# Patient Record
Sex: Female | Born: 1965 | Race: Black or African American | Hispanic: No | Marital: Single | Smoking: Never smoker
Health system: Southern US, Community
[De-identification: ages and names within clinical notes are randomized; demographics above are authoritative.]

## PROBLEM LIST (undated history)

## (undated) DIAGNOSIS — J45909 Unspecified asthma, uncomplicated: Secondary | ICD-10-CM

## (undated) DIAGNOSIS — K219 Gastro-esophageal reflux disease without esophagitis: Secondary | ICD-10-CM

## (undated) DIAGNOSIS — M549 Dorsalgia, unspecified: Secondary | ICD-10-CM

## (undated) DIAGNOSIS — R51 Headache: Secondary | ICD-10-CM

## (undated) DIAGNOSIS — I10 Essential (primary) hypertension: Secondary | ICD-10-CM

## (undated) DIAGNOSIS — Z9889 Other specified postprocedural states: Secondary | ICD-10-CM

## (undated) DIAGNOSIS — R112 Nausea with vomiting, unspecified: Secondary | ICD-10-CM

## (undated) DIAGNOSIS — G43909 Migraine, unspecified, not intractable, without status migrainosus: Secondary | ICD-10-CM

## (undated) HISTORY — DX: Dorsalgia, unspecified: M54.9

## (undated) HISTORY — PX: ABDOMINAL HYSTERECTOMY: SHX81

## (undated) HISTORY — DX: Essential (primary) hypertension: I10

## (undated) HISTORY — PX: ECTOPIC PREGNANCY SURGERY: SHX613

## (undated) HISTORY — DX: Migraine, unspecified, not intractable, without status migrainosus: G43.909

## (undated) HISTORY — DX: Gastro-esophageal reflux disease without esophagitis: K21.9

## (undated) HISTORY — PX: ENDOMETRIAL ABLATION: SHX621

## (undated) HISTORY — PX: OTHER SURGICAL HISTORY: SHX169

## (undated) HISTORY — PX: POLYPECTOMY: SHX149

## (undated) HISTORY — PX: SALPINGECTOMY: SHX328

---

## 2001-05-21 ENCOUNTER — Ambulatory Visit (HOSPITAL_COMMUNITY): Admission: RE | Admit: 2001-05-21 | Discharge: 2001-05-21 | Payer: Self-pay | Admitting: General Surgery

## 2001-05-28 ENCOUNTER — Ambulatory Visit (HOSPITAL_COMMUNITY): Admission: RE | Admit: 2001-05-28 | Discharge: 2001-05-28 | Payer: Self-pay | Admitting: General Surgery

## 2001-05-29 ENCOUNTER — Encounter: Payer: Self-pay | Admitting: General Surgery

## 2002-08-27 ENCOUNTER — Ambulatory Visit (HOSPITAL_COMMUNITY): Admission: RE | Admit: 2002-08-27 | Discharge: 2002-08-27 | Payer: Self-pay | Admitting: Otolaryngology

## 2002-08-27 ENCOUNTER — Encounter: Payer: Self-pay | Admitting: Otolaryngology

## 2002-10-27 ENCOUNTER — Encounter (HOSPITAL_COMMUNITY): Admission: RE | Admit: 2002-10-27 | Discharge: 2002-11-26 | Payer: Self-pay | Admitting: Otolaryngology

## 2003-02-07 ENCOUNTER — Ambulatory Visit (HOSPITAL_COMMUNITY): Admission: RE | Admit: 2003-02-07 | Discharge: 2003-02-07 | Payer: Self-pay | Admitting: Otolaryngology

## 2003-02-07 ENCOUNTER — Encounter (INDEPENDENT_AMBULATORY_CARE_PROVIDER_SITE_OTHER): Payer: Self-pay | Admitting: *Deleted

## 2003-02-07 ENCOUNTER — Ambulatory Visit (HOSPITAL_BASED_OUTPATIENT_CLINIC_OR_DEPARTMENT_OTHER): Admission: RE | Admit: 2003-02-07 | Discharge: 2003-02-07 | Payer: Self-pay | Admitting: Otolaryngology

## 2005-03-21 ENCOUNTER — Ambulatory Visit: Payer: Self-pay | Admitting: Family Medicine

## 2005-04-19 ENCOUNTER — Ambulatory Visit (HOSPITAL_COMMUNITY): Admission: RE | Admit: 2005-04-19 | Discharge: 2005-04-19 | Payer: Self-pay | Admitting: Family Medicine

## 2005-07-03 ENCOUNTER — Ambulatory Visit (HOSPITAL_COMMUNITY): Admission: RE | Admit: 2005-07-03 | Discharge: 2005-07-03 | Payer: Self-pay | Admitting: Obstetrics & Gynecology

## 2005-07-03 ENCOUNTER — Encounter (INDEPENDENT_AMBULATORY_CARE_PROVIDER_SITE_OTHER): Payer: Self-pay | Admitting: *Deleted

## 2006-03-25 ENCOUNTER — Ambulatory Visit: Payer: Self-pay | Admitting: Family Medicine

## 2006-04-10 ENCOUNTER — Ambulatory Visit: Payer: Self-pay | Admitting: Family Medicine

## 2006-04-11 ENCOUNTER — Ambulatory Visit (HOSPITAL_COMMUNITY): Admission: RE | Admit: 2006-04-11 | Discharge: 2006-04-11 | Payer: Self-pay | Admitting: Family Medicine

## 2006-12-09 ENCOUNTER — Ambulatory Visit: Payer: Self-pay | Admitting: Family Medicine

## 2007-05-26 ENCOUNTER — Ambulatory Visit: Payer: Self-pay | Admitting: Family Medicine

## 2007-06-01 DIAGNOSIS — M542 Cervicalgia: Secondary | ICD-10-CM | POA: Insufficient documentation

## 2007-06-01 DIAGNOSIS — M549 Dorsalgia, unspecified: Secondary | ICD-10-CM | POA: Insufficient documentation

## 2007-06-03 ENCOUNTER — Ambulatory Visit: Payer: Self-pay | Admitting: Family Medicine

## 2007-06-03 ENCOUNTER — Ambulatory Visit (HOSPITAL_COMMUNITY): Admission: RE | Admit: 2007-06-03 | Discharge: 2007-06-03 | Payer: Self-pay | Admitting: Family Medicine

## 2007-06-10 ENCOUNTER — Encounter (HOSPITAL_COMMUNITY): Admission: RE | Admit: 2007-06-10 | Discharge: 2007-07-10 | Payer: Self-pay | Admitting: Family Medicine

## 2007-06-22 ENCOUNTER — Encounter: Payer: Self-pay | Admitting: Family Medicine

## 2007-06-22 ENCOUNTER — Ambulatory Visit (HOSPITAL_COMMUNITY): Admission: RE | Admit: 2007-06-22 | Discharge: 2007-06-22 | Payer: Self-pay | Admitting: Orthopaedic Surgery

## 2007-06-22 LAB — CONVERTED CEMR LAB
BUN: 7 mg/dL (ref 6–23)
Calcium: 9.3 mg/dL (ref 8.4–10.5)
Chloride: 105 meq/L (ref 96–112)
Cholesterol: 163 mg/dL (ref 0–200)
Eosinophils Absolute: 0.1 10*3/uL (ref 0.0–0.7)
HCT: 42.8 % (ref 36.0–46.0)
LDL Cholesterol: 93 mg/dL (ref 0–99)
Lymphocytes Relative: 27 % (ref 12–46)
Monocytes Absolute: 0.5 10*3/uL (ref 0.1–1.0)
Platelets: 298 10*3/uL (ref 150–400)
RBC: 4.87 M/uL (ref 3.87–5.11)
Sodium: 139 meq/L (ref 135–145)
Total CHOL/HDL Ratio: 2.7
Triglycerides: 52 mg/dL (ref <150)
VLDL: 10 mg/dL (ref 0–40)

## 2007-07-14 ENCOUNTER — Encounter (HOSPITAL_COMMUNITY): Admission: RE | Admit: 2007-07-14 | Discharge: 2007-08-13 | Payer: Self-pay | Admitting: Family Medicine

## 2007-07-28 ENCOUNTER — Ambulatory Visit: Payer: Self-pay | Admitting: Family Medicine

## 2007-10-10 ENCOUNTER — Emergency Department (HOSPITAL_COMMUNITY): Admission: EM | Admit: 2007-10-10 | Discharge: 2007-10-10 | Payer: Self-pay | Admitting: Emergency Medicine

## 2007-10-12 ENCOUNTER — Telehealth: Payer: Self-pay | Admitting: Family Medicine

## 2007-10-13 ENCOUNTER — Encounter: Payer: Self-pay | Admitting: Family Medicine

## 2007-11-30 ENCOUNTER — Ambulatory Visit: Payer: Self-pay | Admitting: Family Medicine

## 2008-08-18 ENCOUNTER — Emergency Department (HOSPITAL_COMMUNITY): Admission: EM | Admit: 2008-08-18 | Discharge: 2008-08-18 | Payer: Self-pay | Admitting: Emergency Medicine

## 2009-05-26 ENCOUNTER — Encounter (HOSPITAL_COMMUNITY): Admission: RE | Admit: 2009-05-26 | Discharge: 2009-06-25 | Payer: Self-pay | Admitting: Family Medicine

## 2009-08-11 ENCOUNTER — Encounter: Payer: Self-pay | Admitting: Family Medicine

## 2009-08-24 ENCOUNTER — Ambulatory Visit: Payer: Self-pay | Admitting: Family Medicine

## 2009-08-24 DIAGNOSIS — H6690 Otitis media, unspecified, unspecified ear: Secondary | ICD-10-CM | POA: Insufficient documentation

## 2009-08-24 DIAGNOSIS — R519 Headache, unspecified: Secondary | ICD-10-CM | POA: Insufficient documentation

## 2009-08-24 DIAGNOSIS — R51 Headache: Secondary | ICD-10-CM | POA: Insufficient documentation

## 2010-03-04 ENCOUNTER — Encounter: Payer: Self-pay | Admitting: Family Medicine

## 2010-03-13 NOTE — Assessment & Plan Note (Signed)
Summary: migraine   Vital Signs:  Patient profile:   45 year old female Height:      64 inches Weight:      207.25 pounds BMI:     35.70 O2 Sat:      100 % on Room air Pulse rate:   72 / minute Pulse rhythm:   regular Resp:     16 per minute BP sitting:   112 / 78  (left arm)  Vitals Entered By: Adella Hare LPN (August 24, 2009 2:58 PM)  Nutrition Counseling: Patient's BMI is greater than 25 and therefore counseled on weight management options.  O2 Flow:  Room air CC: migraine, with pain mostly on left side of head and neck Is Patient Diabetic? No   CC:  migraine and with pain mostly on left side of head and neck.  History of Present Illness: Pt states she has been unable to work since 07/12 because of headacher, Glynis Smiles has been using OTC migraine med with little relief, expeiriences nausea and dizziness with activity.Currently exp pain to a level 4 , has got up to an 8.Has been working extra long hrs and drinking xs caffeine She denies any recent fever or chills. She denies head or chest congestion. She denies dysuria or frequency. She denies anxiety or depression. She denioes significant joint pain or instability. She continues to work on lifestyle change to improve health and lose weight, but with less consistency than she desires.She denies chest pain, palpitations, pND or orthopnea  Current Medications (verified): 1)  None  Allergies (verified): 1)  ! Pcn  Past History:  Past Medical History: Current Problems:  NECK PAIN (ICD-723.1) BACK PAIN (ICD-724.5) secondary to disc disease, in therapy cuirrently 08/2009 OBESITY, UNSPECIFIED (ICD-278.00) Laceration to left index finger 2009, still numb, ortho taking care of this Migraines  since childhood  Past Surgical History: Right salpingectomy for ectopic pregnancy (1997) Laser surgery to vocal cords (2004) Endometrial ablation for DUB (2007)  dr Despina Hidden  Family History: Mother deceased - heart attack, HTN, died  17 Father living - health status unknown Two sisters living - x1 HTN Three brothers living - x1 HTN  Social History: Employed: Location manager Single  One child, 32 y/o son in 2011 Never Smoked Alcohol use-no Drug use-no  Review of Systems      See HPI General:  Complains of fatigue. Eyes:  Complains of eye pain; denies blurring, discharge, and red eye. ENT:  Complains of earache; left ear pain x 4 days. Resp:  Denies cough, shortness of breath, and sputum productive. GI:  Complains of nausea; denies abdominal pain, constipation, diarrhea, and vomiting; nausea with headache at times. Derm:  Denies itching and lesion(s). Neuro:  Complains of headaches; experiencess 3 headaches over a 4 month period, generally like her classic symptoms, for the past 4 days she has experienced throbbing , nausea, vertigo with this headache, smells are the most disturbing.ASffecting behind left eye to left ear, and neck , the forehead had alaso been involved in the past. Endo:  Denies excessive thirst and heat intolerance. Heme:  Denies abnormal bruising and bleeding. Allergy:  Denies hives or rash and itching eyes.  Physical Exam  General:  Well-developed,well-nourished,in no acute distress; alert,appropriate and cooperative throughout examinationPt in pain, holding hed. HEENT: No facial asymmetry,  EOMI, No sinus tenderness,right  TM' Clear, left is dull with reduced light reflex and fluid behind the drum,oropharynx  pink and moist.   Chest: Clear to auscultation bilaterally.  CVS: S1,  S2, No murmurs, No S3.   Abd: Soft, Nontender.  MS: Adequate ROM spine, hips, shoulders and knees.  Ext: No edema.   CNS: CN 2-12 intact, power tone and sensation normal throughout.   Skin: Intact, no visible lesions or rashes.  Psych: Good eye contact, normal affect.  Memory intact, not anxious or depressed appearing.    Impression & Recommendations:  Problem # 1:  LOM (ICD-382.9) Assessment Comment  Only  Her updated medication list for this problem includes:    Septra Ds 800-160 Mg Tabs (Sulfamethoxazole-trimethoprim) .Marland Kitchen... Take 1 tablet by mouth two times a day  Problem # 2:  HEADACHE (ICD-784.0) Assessment: Deteriorated  Her updated medication list for this problem includes:    Imitrex 100 Mg Tabs (Sumatriptan succinate) ..... One capsule at headache onset then repeat once after 2 hrs if needed, max 2 in 24 hrs  Orders: Depo- Medrol 80mg  (J1040) Ketorolac-Toradol 15mg  (W1191) Admin of Therapeutic Inj  intramuscular or subcutaneous (47829)  Problem # 3:  OBESITY, UNSPECIFIED (ICD-278.00) Assessment: Unchanged  Ht: 64 (08/24/2009)   Wt: 207.25 (08/24/2009)   BMI: 35.70 (08/24/2009)  Complete Medication List: 1)  Topiramate 25 Mg Tabs (Topiramate) .... Take 1 tablet by mouth two times a day 2)  Imitrex 100 Mg Tabs (Sumatriptan succinate) .... One capsule at headache onset then repeat once after 2 hrs if needed, max 2 in 24 hrs 3)  Prednisone (pak) 5 Mg Tabs (Prednisone) .... Use as directed 4)  Septra Ds 800-160 Mg Tabs (Sulfamethoxazole-trimethoprim) .... Take 1 tablet by mouth two times a day  Other Orders: T-Basic Metabolic Panel [id number]) T-Lipid Profile [id number]) T-CBC w/Diff [phone]) T-TSH [id number]) Radiology Referral (Radiology)  Patient Instructions: 1)  Please schedule a follow-up appointment in 3 months. 2)  It is important that you exercise regularly at least 30 minutes 5 times a week. If you develop chest pain, have severe difficulty breathing, or feel very tired , stop exercising immediately and seek medical attention. 3)  You need to lose weight. Consider a lower calorie diet and regular exercise.  4)  BMP prior to visit, ICD-9: 5)  Lipid Panel prior to visit, ICD-9:   fasting in 3 months 6)  CBC w/ Diff prior to visit, ICD-9: 7)  TSH prior to visit, ICD-9: 8)  You need a mamogram, we will schedule. 9)  You will receive injections for  your headache .Meds are also sent in for this and the ear infection 10)  Pls start a headache diary Prescriptions: SEPTRA DS 800-160 MG TABS (SULFAMETHOXAZOLE-TRIMETHOPRIM) Take 1 tablet by mouth two times a day  #14 x 0   Entered and Authorized by:   Syliva Overman MD   Signed by:   Syliva Overman MD on 08/24/2009   Method used:   Electronically to        [facility] (retail)       [address]       [place], [place]  [address]       Ph: [phone]       Fax: [phone]   RxID:   [id number] PREDNISONE (PAK) 5 MG TABS (PREDNISONE) Use as directed  #21 x 0   Entered and Authorized by:   Syliva Overman MD   Signed by:   Syliva Overman MD on 08/24/2009   Method used:   Electronically to        [facility] (retail)       [address]  [place], [place]  [address]       Ph: [phone]       Fax: [phone]   RxID:   [id number] IMITREX 100 MG TABS (SUMATRIPTAN SUCCINATE) one capsule at headache onset then repeat once after 2 hrs if needed, max 2 in 24 hrs  #8 x 3   Entered and Authorized by:   Syliva Overman MD   Signed by:   Syliva Overman MD on 08/24/2009   Method used:   Electronically to        [facility] ([facility]       [address]       [place], [place]  [address]       Ph: [phone]       Fax: [phone]   RxID:   [id number] TOPIRAMATE 25 MG TABS (TOPIRAMATE) Take 1 tablet by mouth two times a day  #60 x 3   Entered and Authorized by:   Syliva Overman MD   Signed by:   Syliva Overman MD on 08/24/2009   Method used:   Electronically to        [facility] ([facility])       [address]       [place], [place]  [address]       Ph: [phone]       Fax: [phone]   RxID:   [id number]    Medication Administration  Injection # 1:    Medication: Depo- Medrol 80mg     Diagnosis: HEADACHE (ICD-784.0)    Route: IM    Site: RUOQ gluteus    Exp Date: 4/12    Lot #: [id number]     Mfr: Pharmacia    Patient tolerated injection without complications    Given by: Adella Hare LPN (August 24, 2009 3:55 PM)  Injection # 2:    Medication: Ketorolac-Toradol 15mg     Diagnosis: HEADACHE (ICD-784.0)    Route: IM    Site: RUOQ gluteus    Exp Date: 01/12/2011    Lot #: [id number]    Mfr: hospira    Comments: toradol 60mg  given    Patient tolerated injection without complications    Given by: Adella Hare LPN (August 24, 2009 3:55 PM)  Orders Added: 1)  Est. Patient Level IV [73710] 2)  T-Basic Metabolic Panel [[id number]] 3)  T-Lipid Profile [80061-22930] 4)  T-CBC w/Diff [[id number]] 5)  T-TSH [[id number]] 6)  Depo- Medrol 80mg  [J1040] 7)  Ketorolac-Toradol 15mg  [J1885] 8)  Admin of Therapeutic Inj  intramuscular or subcutaneous [96372] 9)  Radiology Referral [Radiology]

## 2010-03-13 NOTE — Letter (Signed)
Summary: Out of Work  [facility]  [address]   [place], [place] [address]   Phone: [phone]  Fax: [phone]    August 24, 2009   Employee:  Bridget Roberts Gosnell    To Whom It May Concern:   For Medical reasons, please excuse the above named employee from work for the following dates:  Start:   08/22/09  End:   08/28/09 to return with no restriction  If you need additional information, please feel free to contact our office.         Sincerely,    Syliva Overman, MD

## 2010-03-15 NOTE — Miscellaneous (Signed)
Summary: flu  flu   Imported By: Lind Guest 02/21/2010 11:24:38  _____________________________________________________________________  External Attachment:    Type:   Image     Comment:   External Document

## 2010-06-29 NOTE — Op Note (Signed)
NAME:  Bridget Roberts, Bridget Roberts                         ACCOUNT NO.:  [id number]   MEDICAL RECORD NO.:  [id number]                   PATIENT TYPE:  AMB   LOCATION:  DSC                                  FACILITY:  [facility]   PHYSICIAN:  Karol T. Lazarus Salines, M.D.              DATE OF BIRTH:  1965/03/10   DATE OF PROCEDURE:  02/07/2003  DATE OF DISCHARGE:                                 OPERATIVE REPORT   PREOPERATIVE DIAGNOSIS:  Left vocal cord cystic nodule.   POSTOPERATIVE DIAGNOSIS:  Left vocal cord cystic nodule.   PROCEDURE PERFORMED:  Micro-direct laryngoscopy with stripping, left  anterior vocal cord.   SURGEON:  Gloris Manchester. Lazarus Salines, M.D.   ANESTHESIA:  General orotracheal.   BLOOD LOSS:  Minimal.   COMPLICATIONS:  None.   FINDINGS:  A flat, slightly irregular mucosal lesion on the anterior left  vocal cord without any obvious cystic component, and readily dissected free  from the Reinke's layer.  A small flat thickening on the opposite cord felt  to be reactive.   PROCEDURE:  With the patient in a comfortable supine position, general  orotracheal anesthesia was induced without difficulty.  At an appropriate  level, the table was turned 90 degrees and the patient placed in reversed  Trendelenburg.  A clean preparation and draping were accomplished.  Rubber  tooth guard was placed.  Taking care to protect lips, teeth, and  endotracheal tube, the Dedo laryngoscope was introduced, inserted under the  epiglottis and into the glottis proper, with the findings as described  above.  The scope was suspended in the standard fashion.  Slight anterior  external pressure was required to fully visualize the anterior commissure.  The findings were as described above.  Photographs were taken.  Cocaine  solution 4% was applied on cotton pledgets to the mucosa of the vocal cords  for intraoperative hemostasis and allowed to remain in place for several  minutes.   The pledgets were removed and observed  to be intact and correct in number.  Using the sharp sickle knife, a linear incision along the axis of the vocal  cord was made just superior to the lesion and using the sickle knife, the  mucosa was dissected from the Reinke's layer of the vocal cord.  Inferior  and posterior delimiting incisions were performed with straight scissors and  the inferior mucosal margin beneath the lesion was incised with the same  scissors.  The lesion was sent off for permanent interpretation.  There was  no bleeding.  The lesion was felt to have been removed in its entirety.  Cocaine pledgets 4% were applied once again for hemostasis and to prevent  coughing postoperatively.  Again, they were allowed to remain in place for  several minutes.  Photographs were taken.  At this point, the procedure was  completed.  Frozen section was not obtained because the lesion was too  small.  The laryngoscope was unsuspended and removed, as was the tooth  guard.  The dental status was intact.  The patient was returned to  anesthesia, awakened, extubated, and transferred to recovery in stable  condition.   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  The flat lesion may  have been a ruptured cyst.  Anticipate a routine postoperative recovery with  attention to analgesia with Tylenol with codeine, which will also serve as a  cough suppressant, and with vocal rest and vocal hygiene.                                               Gloris Manchester. Lazarus Salines, M.D.    KTW/MEDQ  D:  02/07/2003  T:  02/07/2003  Job:  [id number]   cc:   Elliot Cousin, M.D.

## 2010-06-29 NOTE — Op Note (Signed)
NAMECLEDITH, KAMIYA               ACCOUNT NO.:  [id number]   MEDICAL RECORD NO.:  [id number]          PATIENT TYPE:  AMB   LOCATION:  DAY                           FACILITY:  [facility]   PHYSICIAN:  Lazaro Arms, M.D.   DATE OF BIRTH:  19-Jun-1965   DATE OF PROCEDURE:  07/03/2005  DATE OF DISCHARGE:                                 OPERATIVE REPORT   PREOPERATIVE DIAGNOSES:  1.  Menometrorrhagia.  2.  Dysmenorrhea.   POSTOPERATIVE DIAGNOSES:  1.  Menometrorrhagia.  2.  Dysmenorrhea.   PROCEDURE:  Hysteroscopy, dilatation and curettage, endometrial ablation.   SURGEON:  Lazaro Arms, M.D.   ANESTHESIA:  General endotracheal.   FINDINGS:  Patient had a normal endometrial cavity.  There were no polyps or  other abnormalities.   DESCRIPTION OF OPERATION:  The patient was taken to the operating room,  placed in a supine position, where she underwent general endotracheal  anesthesia.  She was placed in a dorsal lithotomy position, prepped and  draped in the usual sterile fashion.  A Graves speculum was placed.  The  cervix was grasped.  Diagnostic hysteroscopies were performed, and normal  endometrial cavity was seen.  A vigorous uterine curettage was performed,  and good uterine cry was obtained in all areas.  A ThermaChoice III  endometrial ablation balloon was then used.  It was filled to maintain a  pressure of 190-200 mmHg and heated to 88 degrees Celsius.  Total therapy  time was 9 minutes, 32 seconds.  All of the fluid was returned at the end of  the procedure.  __________ cc to maintain the pressure.   Patient was awakened from anesthesia and taken to the recovery room in good,  stable condition.  All counts were correct x3.      Lazaro Arms, M.D.  Electronically Signed     LHE/MEDQ  D:  07/03/2005  T:  07/03/2005  Job:  [id number]

## 2010-06-29 NOTE — H&P (Signed)
NAME:  Bridget Roberts, Bridget Roberts               ACCOUNT NO.:  [id number]   MEDICAL RECORD NO.:  [id number]          PATIENT TYPE:  AMB   LOCATION:  DAY                           FACILITY:  [facility]   PHYSICIAN:  Lazaro Arms, M.D.   DATE OF BIRTH:  05-01-1965   DATE OF ADMISSION:  DATE OF DISCHARGE:  LH                                HISTORY & PHYSICAL   Bridget Roberts is a 45 year old African-American female, gravida 5, para 1, abortus  4, status post two tubal pregnancies in the past with subsequent removal,  who is complaining of increasing heavy pain at the time of her cycle,  bleeding 7 to 10 days with heavy flow and large blood clots.  She had an  ultrasound performed which revealed a normal uterus, normal endometrium.  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  She is status post two  ectopic pregnancies with subsequent removal.  As a result, she is admitted  for hysteroscopy, D&C, and endometrial ablation.   PAST MEDICAL HISTORY:  Significant for migraine headaches.   PAST SURGICAL HISTORY:  She has had removal of two tubal pregnancies.   ALLERGIES:  PENICILLIN.   REVIEW OF SYSTEMS:  Otherwise negative.   PHYSICAL EXAMINATION:  HEENT:  Unremarkable.  Thyroid is normal.  LUNGS:  Clear are clear.  HEART:  Regular rate and rhythm with no regurgitation or gallop.  BREASTS:  Without masses or discharge or skin changes.  ABDOMEN:  Benign.  No hepatosplenomegaly or mass.  PELVIC:  She has normal external genitalia. Vagina is pink with slight  discharge.  Cervix with parous with what looks like lesions.  Uterus normal  size, shape and contour.  Ovaries are normal and nontender.  EXTREMITIES:  No edema.  NEUROLOGICAL:  Grossly intact.   IMPRESSION:  1.  Menometrorrhagia.  2.  Dysmenorrhea.   PLAN:  The patient is admitted for hysteroscopy, D&C and endometrial  ablation for management of her problems.  She understands the risks,  benefits, indications and alternatives to the ablation and  will proceed.      Lazaro Arms, M.D.  Electronically Signed     LHE/MEDQ  D:  07/02/2005  T:  07/03/2005  Job:  [id number]

## 2011-04-09 ENCOUNTER — Other Ambulatory Visit: Payer: Self-pay | Admitting: Family Medicine

## 2011-04-09 ENCOUNTER — Encounter: Payer: Self-pay | Admitting: Family Medicine

## 2011-04-09 ENCOUNTER — Ambulatory Visit (INDEPENDENT_AMBULATORY_CARE_PROVIDER_SITE_OTHER): Payer: Managed Care, Other (non HMO) | Admitting: Family Medicine

## 2011-04-09 VITALS — BP 126/80 | HR 82 | Resp 18 | Ht 64.0 in | Wt 205.0 lb

## 2011-04-09 DIAGNOSIS — N898 Other specified noninflammatory disorders of vagina: Secondary | ICD-10-CM

## 2011-04-09 DIAGNOSIS — E669 Obesity, unspecified: Secondary | ICD-10-CM

## 2011-04-09 DIAGNOSIS — R7301 Impaired fasting glucose: Secondary | ICD-10-CM

## 2011-04-09 DIAGNOSIS — E559 Vitamin D deficiency, unspecified: Secondary | ICD-10-CM

## 2011-04-09 DIAGNOSIS — Z139 Encounter for screening, unspecified: Secondary | ICD-10-CM

## 2011-04-09 DIAGNOSIS — M549 Dorsalgia, unspecified: Secondary | ICD-10-CM

## 2011-04-09 DIAGNOSIS — R5381 Other malaise: Secondary | ICD-10-CM

## 2011-04-09 DIAGNOSIS — Z13220 Encounter for screening for lipoid disorders: Secondary | ICD-10-CM

## 2011-04-09 DIAGNOSIS — N939 Abnormal uterine and vaginal bleeding, unspecified: Secondary | ICD-10-CM

## 2011-04-09 MED ORDER — METHYLPREDNISOLONE ACETATE PF 80 MG/ML IJ SUSP
80.0000 mg | Freq: Once | INTRAMUSCULAR | Status: AC
  Administered 2011-04-09: 80 mg via INTRAMUSCULAR

## 2011-04-09 MED ORDER — PREDNISONE (PAK) 5 MG PO TABS: 5.0000 mg | ORAL_TABLET | ORAL | Status: DC

## 2011-04-09 MED ORDER — IBUPROFEN 800 MG PO TABS: 800.0000 mg | ORAL_TABLET | Freq: Three times a day (TID) | ORAL | Status: DC | PRN

## 2011-04-09 MED ORDER — KETOROLAC TROMETHAMINE 60 MG/2ML IM SOLN
60.0000 mg | Freq: Once | INTRAMUSCULAR | Status: AC
  Administered 2011-04-09: 60 mg via INTRAMUSCULAR

## 2011-04-09 NOTE — Patient Instructions (Addendum)
cPE in 2 month.   labs today, cbc, chem7, lipid, tsh, vit D hBAiC toradol and depomedrol today, and medication sent in for back pain, you are also referred for therapy   It is important that you exercise regularly at least 30 minutes 5 times a week. If you develop chest pain, have severe difficulty breathing, or feel very tired, stop exercising immediately and seek medical attention    A healthy diet is rich in fruit, vegetables and whole grains. Poultry fish, nuts and beans are a healthy choice for protein rather then red meat. A low sodium diet and drinking 64 ounces of water daily is generally recommended. Oils and sweet should be limited. Carbohydrates especially for those who are diabetic or overweight, should be limited to 34-45 gram per meal. It is important to eat on a regular schedule, at least 3 times daily. Snacks should be primarily fruits, vegetables or nuts.   You will get a 1500CALORIE DIET SHEET  AIM FOR 3 TO 4 POUND WEIGHT LOSS PER MONTH  YOU WILL BE REFERRED FOR EVALUATION OF YOUR WOMB SINCE YOU STARTED BLEEDING 2 YEARS AFTER NOT BLEEDING  mAMMOGRAM WILL BE SCHEDULED ON THE WAY OUT

## 2011-04-09 NOTE — Assessment & Plan Note (Signed)
Acute pain toradol and depomedrol in office and meds to pharmacy, and therapy

## 2011-04-11 DIAGNOSIS — E559 Vitamin D deficiency, unspecified: Secondary | ICD-10-CM | POA: Insufficient documentation

## 2011-04-11 LAB — VITAMIN D 25 HYDROXY (VIT D DEFICIENCY, FRACTURES): Vit D, 25-Hydroxy: 17 ng/mL — ABNORMAL LOW (ref 30–89)

## 2011-04-11 LAB — CBC WITH DIFFERENTIAL/PLATELET
Lymphocytes Relative: 9 % — ABNORMAL LOW (ref 12–46)
Lymphs Abs: 0.8 10*3/uL (ref 0.7–4.0)
MCHC: 32.8 g/dL (ref 30.0–36.0)
Monocytes Relative: 2 % — ABNORMAL LOW (ref 3–12)
Neutro Abs: 7.8 10*3/uL — ABNORMAL HIGH (ref 1.7–7.7)
Neutrophils Relative %: 89 % — ABNORMAL HIGH (ref 43–77)
Platelets: 335 10*3/uL (ref 150–400)
RBC: 4.66 MIL/uL (ref 3.87–5.11)
WBC: 8.7 10*3/uL (ref 4.0–10.5)

## 2011-04-11 LAB — HEMOGLOBIN A1C: Mean Plasma Glucose: 111 mg/dL (ref <117)

## 2011-04-11 LAB — BASIC METABOLIC PANEL
BUN: 11 mg/dL (ref 6–23)
Chloride: 105 mEq/L (ref 96–112)
Glucose, Bld: 106 mg/dL — ABNORMAL HIGH (ref 70–99)
Potassium: 4.3 mEq/L (ref 3.5–5.3)

## 2011-04-11 LAB — LIPID PANEL
LDL Cholesterol: 111 mg/dL — ABNORMAL HIGH (ref 0–99)
Total CHOL/HDL Ratio: 2.7 Ratio
Triglycerides: 28 mg/dL (ref <150)
VLDL: 6 mg/dL (ref 0–40)

## 2011-04-11 MED ORDER — ERGOCALCIFEROL 1.25 MG (50000 UT) PO CAPS: 50000.0000 [IU] | ORAL_CAPSULE | ORAL | Status: AC

## 2011-04-12 ENCOUNTER — Ambulatory Visit (HOSPITAL_COMMUNITY): Payer: Managed Care, Other (non HMO)

## 2011-04-18 DIAGNOSIS — N939 Abnormal uterine and vaginal bleeding, unspecified: Secondary | ICD-10-CM | POA: Insufficient documentation

## 2011-04-18 NOTE — Assessment & Plan Note (Signed)
New dx pt to take weekly vit D

## 2011-04-18 NOTE — Progress Notes (Signed)
  Subjective:    Patient ID: Bridget Roberts, female    DOB: June 04, 1965, 46 y.o.   MRN: [id number]  HPI Pt presents with acute disabling back pain which radiaites down her leg, and inteferes with her ability to adequately function even in her home environment, currently as well as on the job. She is limping in pain, denies weakness or numbness of lower extremities or incontinence of stool or urine. She does have established arthritis in the back and disc disease since 2009. Pt has not been in  for over 1 years and is behind in routine health maintenance. She c/o menstrual flow after 2 years with no bleeding, she has had ablation and was told this may occur.   Review of Systems See HPI Denies recent fever or chills. Denies sinus pressure, nasal congestion, ear pain or sore throat. Denies chest congestion, productive cough or wheezing. Denies chest pains, palpitations and leg swelling Denies abdominal pain, nausea, vomiting,diarrhea or constipation.   Denies dysuria, frequency, hesitancy or incontinence. Denies headaches, seizures, numbness, or tingling. Denies depression, anxiety or insomnia. Denies skin break down or rash.         Objective:   Physical Exam Patient alert and oriented and in no cardiopulmonary distress.Pt in pain  HEENT: No facial asymmetry, EOMI, no sinus tenderness,  oropharynx pink and moist.  Neck supple no adenopathy.  Chest: Clear to auscultation bilaterally.  CVS: S1, S2 no murmurs, no S3.  ABD: Soft non tender. Bowel sounds normal.  Ext: No edema  MS: decreased  ROM spine,adequate in  shoulders, hips and knees.  Skin: Intact, no ulcerations or rash noted.  Psych: Good eye contact, normal affect. Memory intact not anxious or depressed appearing.  CNS: CN 2-12 intact, power, tone and sensation normal throughout.        Assessment & Plan:

## 2011-04-18 NOTE — Assessment & Plan Note (Signed)
Unchanged. Patient re-educated about  the importance of commitment to a  minimum of 150 minutes of exercise per week. The importance of healthy food choices with portion control discussed. Encouraged to start a food diary, count calories and to consider  joining a support group. Sample diet sheets offered. Goals set by the patient for the next several months.    

## 2011-04-25 ENCOUNTER — Ambulatory Visit (HOSPITAL_COMMUNITY): Payer: Managed Care, Other (non HMO)

## 2011-06-11 ENCOUNTER — Encounter: Payer: Managed Care, Other (non HMO) | Admitting: Family Medicine

## 2011-06-11 ENCOUNTER — Encounter: Payer: Self-pay | Admitting: Family Medicine

## 2011-08-19 ENCOUNTER — Other Ambulatory Visit: Payer: Self-pay | Admitting: Family Medicine

## 2011-08-20 ENCOUNTER — Ambulatory Visit (INDEPENDENT_AMBULATORY_CARE_PROVIDER_SITE_OTHER): Payer: Managed Care, Other (non HMO) | Admitting: Family Medicine

## 2011-08-20 ENCOUNTER — Telehealth (HOSPITAL_COMMUNITY): Payer: Self-pay | Admitting: Dietician

## 2011-08-20 ENCOUNTER — Encounter: Payer: Self-pay | Admitting: Family Medicine

## 2011-08-20 VITALS — BP 122/84 | HR 66 | Resp 16 | Ht 64.0 in | Wt 210.4 lb

## 2011-08-20 DIAGNOSIS — E669 Obesity, unspecified: Secondary | ICD-10-CM

## 2011-08-20 DIAGNOSIS — M549 Dorsalgia, unspecified: Secondary | ICD-10-CM

## 2011-08-20 MED ORDER — METHYLPREDNISOLONE ACETATE 80 MG/ML IJ SUSP
80.0000 mg | Freq: Once | INTRAMUSCULAR | Status: AC
  Administered 2011-08-20: 80 mg via INTRAMUSCULAR

## 2011-08-20 MED ORDER — KETOROLAC TROMETHAMINE 60 MG/2ML IM SOLN
60.0000 mg | Freq: Once | INTRAMUSCULAR | Status: AC
  Administered 2011-08-20: 60 mg via INTRAMUSCULAR

## 2011-08-20 MED ORDER — CYCLOBENZAPRINE HCL 10 MG PO TABS: 10.0000 mg | ORAL_TABLET | Freq: Three times a day (TID) | ORAL | Status: DC | PRN

## 2011-08-20 MED ORDER — HYDROCODONE-ACETAMINOPHEN 5-500 MG PO TABS: 1.0000 | ORAL_TABLET | ORAL | Status: DC | PRN

## 2011-08-20 NOTE — Telephone Encounter (Signed)
Received referral via fax from [facility] (Dr. Jeanice Lim) for dx: obesity.

## 2011-08-20 NOTE — Telephone Encounter (Signed)
Called number provided. Unable to leave message. Sent letter to pt home via [place] Mail in attempt to contact pt to schedule appointment.

## 2011-08-20 NOTE — Patient Instructions (Signed)
Call me back about the surgeon for your back I will set you up for physical therapy  Take the pain pills as needed Take the muscle relaxant  Note given  F/U with Dr. Lodema Hong - On Friday for bloodwork  Paper given for Mammogram

## 2011-08-20 NOTE — Progress Notes (Signed)
  Subjective:    Patient ID: Bridget Roberts, female    DOB: 10-27-1965, 46 y.o.   MRN: [id number]  HPI Pt presents with back pain x 2 days, she has history of lumbar disc bulge, she bent over to pick something up and heard a pop, pain similar to previous episodes. No change in bowel or bladder. Ibuprofen not helping very  Much, would like to have PT and be sent back to her back specialist, it has been 4 years  Review of Systems  GEN- denies fatigue, fever, weight loss,weakness, recent illness CVS- denies chest pain, palpitations RESP- denies SOB, cough, wheeze ABD- denies N/V, change in stools, abd pain GU- denies dysuria, hematuria, dribbling, incontinence MSK-+ joint pain, muscle aches, injury Neuro- denies headache, dizziness, syncope, seizure activity     Objective:   Physical Exam GEN- NAD, alert and oriented x3 HEENT- PERRL, EOMI, non injected sclera, pink conjunctiva, MMM, oropharynx clear Neck- Supple, normal ROM Back- mild TTP lumbar spine, neg SLR, pain with flexion at back, minimal spasm noted lumbar region Neuro- CNII-XII in tact, motor equal bilat lower Ext, sensation grossly intact, DTR symmetric bilat lower ext, antalgic gait EXT- No edema Pulses- Radial, DP- 2+        Assessment & Plan:

## 2011-08-25 NOTE — Assessment & Plan Note (Signed)
weight also contributing factor to back

## 2011-08-25 NOTE — Assessment & Plan Note (Signed)
Acute on chronic pain, vicodin prn short term course, muscle relaxant, given abortive treatment for pain in office, toradol and Depo Medrol which has helped in past, pt has FMLA form already on file, given few days off work PT referral She will call back with the name of her previous specialist for new referral

## 2011-08-27 ENCOUNTER — Encounter (HOSPITAL_COMMUNITY): Payer: Self-pay | Admitting: Dietician

## 2011-08-27 NOTE — Telephone Encounter (Signed)
Sent letter to pt home via [place] Mail in attempt to contact pt to schedule appointment.  

## 2011-08-30 NOTE — Telephone Encounter (Signed)
Sent letter to pt home via [place] Mail in attempt to contact pt to schedule appointment.  

## 2011-09-06 ENCOUNTER — Ambulatory Visit: Payer: Managed Care, Other (non HMO) | Admitting: Family Medicine

## 2011-09-09 NOTE — Telephone Encounter (Signed)
Pt has not responded to attempts to contact to schedule appointment. Referral filed.  

## 2012-01-03 ENCOUNTER — Other Ambulatory Visit: Payer: Self-pay | Admitting: Family Medicine

## 2012-05-04 ENCOUNTER — Ambulatory Visit (INDEPENDENT_AMBULATORY_CARE_PROVIDER_SITE_OTHER): Payer: Managed Care, Other (non HMO) | Admitting: Family Medicine

## 2012-05-04 ENCOUNTER — Encounter: Payer: Self-pay | Admitting: Family Medicine

## 2012-05-04 VITALS — BP 130/82 | HR 73 | Resp 16 | Ht 64.0 in | Wt 216.4 lb

## 2012-05-04 DIAGNOSIS — R5383 Other fatigue: Secondary | ICD-10-CM

## 2012-05-04 DIAGNOSIS — E669 Obesity, unspecified: Secondary | ICD-10-CM

## 2012-05-04 DIAGNOSIS — R7309 Other abnormal glucose: Secondary | ICD-10-CM

## 2012-05-04 DIAGNOSIS — M549 Dorsalgia, unspecified: Secondary | ICD-10-CM

## 2012-05-04 DIAGNOSIS — E559 Vitamin D deficiency, unspecified: Secondary | ICD-10-CM

## 2012-05-04 DIAGNOSIS — E785 Hyperlipidemia, unspecified: Secondary | ICD-10-CM

## 2012-05-04 DIAGNOSIS — R7302 Impaired glucose tolerance (oral): Secondary | ICD-10-CM

## 2012-05-04 DIAGNOSIS — R5381 Other malaise: Secondary | ICD-10-CM

## 2012-05-04 DIAGNOSIS — Z79899 Other long term (current) drug therapy: Secondary | ICD-10-CM

## 2012-05-04 MED ORDER — IBUPROFEN 800 MG PO TABS: 800.0000 mg | ORAL_TABLET | Freq: Three times a day (TID) | ORAL | Status: DC | PRN

## 2012-05-04 MED ORDER — CYCLOBENZAPRINE HCL 10 MG PO TABS: 10.0000 mg | ORAL_TABLET | Freq: Three times a day (TID) | ORAL | Status: DC | PRN

## 2012-05-04 NOTE — Patient Instructions (Addendum)
F/u in 4 month  Weight loss goal of 4 pounds per month  Toradol 60mg  IM and depo medrol 80mg  IM today for back pain  Work excuse for today return tomorrow   It is important that you exercise regularly at least 45 minutes 5 times a week. If you develop chest pain, have severe difficulty breathing, or feel very tired, stop exercising immediately and seek medical attention   You will get a 1500 calorie diet sheet, portion size is important , also food and drink choice.  Ibuprofen, and flexeril are sent to your pharmacy  Fasting lipid, chem 7, HBA1C, cbc, TSH and vit d this week please   Pt has called in to return on 05/11/2012 and request therapy , same will be granted

## 2012-05-05 DIAGNOSIS — M549 Dorsalgia, unspecified: Secondary | ICD-10-CM

## 2012-05-05 MED ORDER — METHYLPREDNISOLONE ACETATE 80 MG/ML IJ SUSP
80.0000 mg | Freq: Once | INTRAMUSCULAR | Status: AC
  Administered 2012-05-05: 80 mg via INTRAMUSCULAR

## 2012-05-05 MED ORDER — KETOROLAC TROMETHAMINE 60 MG/2ML IM SOLN
60.0000 mg | Freq: Once | INTRAMUSCULAR | Status: AC
  Administered 2012-05-05: 60 mg via INTRAMUSCULAR

## 2012-05-07 ENCOUNTER — Telehealth: Payer: Self-pay | Admitting: Family Medicine

## 2012-05-07 NOTE — Telephone Encounter (Signed)
pls print new work excuse from 3/24 to return 3/31 with no restrictions, I will sign BEFORE afternoon shwe is coming to collect

## 2012-05-07 NOTE — Telephone Encounter (Signed)
pls follow through on referral for PT which I entered, thanks

## 2012-05-09 LAB — CBC WITH DIFFERENTIAL/PLATELET
Basophils Absolute: 0 10*3/uL (ref 0.0–0.1)
Basophils Relative: 0 % (ref 0–1)
Eosinophils Absolute: 0.1 10*3/uL (ref 0.0–0.7)
HCT: 41.3 % (ref 36.0–46.0)
Lymphs Abs: 2.2 10*3/uL (ref 0.7–4.0)
MCH: 28.7 pg (ref 26.0–34.0)
Monocytes Absolute: 0.5 10*3/uL (ref 0.1–1.0)
Neutro Abs: 5.6 10*3/uL (ref 1.7–7.7)
RBC: 4.91 MIL/uL (ref 3.87–5.11)
RDW: 13.7 % (ref 11.5–15.5)

## 2012-05-09 LAB — BASIC METABOLIC PANEL
BUN: 8 mg/dL (ref 6–23)
Creat: 0.86 mg/dL (ref 0.50–1.10)
Sodium: 137 mEq/L (ref 135–145)

## 2012-05-09 LAB — LIPID PANEL
LDL Cholesterol: 121 mg/dL — ABNORMAL HIGH (ref 0–99)
Total CHOL/HDL Ratio: 3 Ratio
VLDL: 12 mg/dL (ref 0–40)

## 2012-05-09 LAB — HEMOGLOBIN A1C
Hgb A1c MFr Bld: 5.7 % — ABNORMAL HIGH (ref <5.7)
Mean Plasma Glucose: 117 mg/dL — ABNORMAL HIGH (ref <117)

## 2012-05-11 ENCOUNTER — Other Ambulatory Visit: Payer: Self-pay | Admitting: Family Medicine

## 2012-05-11 DIAGNOSIS — R7302 Impaired glucose tolerance (oral): Secondary | ICD-10-CM | POA: Insufficient documentation

## 2012-05-11 DIAGNOSIS — E785 Hyperlipidemia, unspecified: Secondary | ICD-10-CM | POA: Insufficient documentation

## 2012-05-11 LAB — VITAMIN D 25 HYDROXY (VIT D DEFICIENCY, FRACTURES): Vit D, 25-Hydroxy: 24 ng/mL — ABNORMAL LOW (ref 30–89)

## 2012-05-11 NOTE — Telephone Encounter (Signed)
Bridget Roberts has faxed over information they will call patient with an appointment

## 2012-05-11 NOTE — Assessment & Plan Note (Signed)
Patient educated about the importance of limiting  Carbohydrate intake , the need to commit to daily physical activity for a minimum of 30 minutes , and to commit weight loss. The fact that changes in all these areas will reduce or eliminate all together the development of diabetes is stressed.    

## 2012-05-11 NOTE — Assessment & Plan Note (Signed)
Low fat diet discussed, no meds at this time

## 2012-05-11 NOTE — Progress Notes (Signed)
  Subjective:    Patient ID: Bridget Roberts, female    DOB: 07-Aug-1965, 47 y.o.   MRN: [id number]  HPI 3 week h/o low back pain, aggravated by activities in the home.No lower extremity weakness or numbness, no incontinence of stool or urine. Has chronic back pain issues. Weight continues to be a challenge, however, determined to lose weight in the next several month   Review of Systems See HPI Denies recent fever or chills. Denies sinus pressure, nasal congestion, ear pain or sore throat. Denies chest congestion, productive cough or wheezing. Denies chest pains, palpitations and leg swelling Denies abdominal pain, nausea, vomiting,diarrhea or constipation.   Denies dysuria, frequency, hesitancy or incontinence. Denies headaches, seizures, numbness, or tingling. Denies depression, anxiety or insomnia. Denies skin break down or rash.        Objective:   Physical Exam  Patient alert and oriented and in no cardiopulmonary distress.  HEENT: No facial asymmetry, EOMI, no sinus tenderness,  oropharynx pink and moist.  Neck supple no adenopathy.  Chest: Clear to auscultation bilaterally.  CVS: S1, S2 no murmurs, no S3.  ABD: Soft non tender. Bowel sounds normal.  Ext: No edema  MS: decreased  ROM spine, adequate in shoulders, hips and knees.  Skin: Intact, no ulcerations or rash noted.  Psych: Good eye contact, normal affect. Memory intact not anxious or depressed appearing.  CNS: CN 2-12 intact, power, tone and sensation normal throughout.       Assessment & Plan:

## 2012-05-11 NOTE — Assessment & Plan Note (Signed)
Deteriorated. Patient re-educated about  the importance of commitment to a  minimum of 150 minutes of exercise per week. The importance of healthy food choices with portion control discussed. Encouraged to start a food diary, count calories and to consider  joining a support group. Sample diet sheets offered. Goals set by the patient for the next several months.    

## 2012-05-11 NOTE — Assessment & Plan Note (Signed)
Increased and uncontrolled, anti inflammatoreis, muscle relaxant, physical therapy and work excuse

## 2012-05-13 ENCOUNTER — Other Ambulatory Visit: Payer: Self-pay

## 2012-05-13 MED ORDER — ERGOCALCIFEROL 1.25 MG (50000 UT) PO CAPS: 50000.0000 [IU] | ORAL_CAPSULE | ORAL | Status: DC

## 2012-09-03 ENCOUNTER — Encounter: Payer: Self-pay | Admitting: Family Medicine

## 2012-09-03 ENCOUNTER — Ambulatory Visit: Payer: Managed Care, Other (non HMO) | Admitting: Family Medicine

## 2012-09-04 ENCOUNTER — Encounter: Payer: Self-pay | Admitting: Family Medicine

## 2012-09-04 ENCOUNTER — Ambulatory Visit (INDEPENDENT_AMBULATORY_CARE_PROVIDER_SITE_OTHER): Payer: Managed Care, Other (non HMO) | Admitting: Family Medicine

## 2012-09-04 VITALS — BP 118/84 | HR 87 | Resp 16 | Wt 205.0 lb

## 2012-09-04 DIAGNOSIS — M549 Dorsalgia, unspecified: Secondary | ICD-10-CM

## 2012-09-04 DIAGNOSIS — G43909 Migraine, unspecified, not intractable, without status migrainosus: Secondary | ICD-10-CM | POA: Insufficient documentation

## 2012-09-04 DIAGNOSIS — R7302 Impaired glucose tolerance (oral): Secondary | ICD-10-CM

## 2012-09-04 DIAGNOSIS — E785 Hyperlipidemia, unspecified: Secondary | ICD-10-CM

## 2012-09-04 DIAGNOSIS — E669 Obesity, unspecified: Secondary | ICD-10-CM

## 2012-09-04 DIAGNOSIS — E559 Vitamin D deficiency, unspecified: Secondary | ICD-10-CM

## 2012-09-04 DIAGNOSIS — R51 Headache: Secondary | ICD-10-CM

## 2012-09-04 DIAGNOSIS — R7309 Other abnormal glucose: Secondary | ICD-10-CM

## 2012-09-04 MED ORDER — TOPIRAMATE 25 MG PO TABS: 25.0000 mg | ORAL_TABLET | Freq: Every day | ORAL | Status: DC

## 2012-09-04 MED ORDER — SUMATRIPTAN SUCCINATE 100 MG PO TABS: ORAL_TABLET | ORAL | Status: DC

## 2012-09-04 NOTE — Progress Notes (Signed)
  Subjective:    Patient ID: Bridget Roberts, female    DOB: 1965-09-15, 47 y.o.   MRN: [id number]  HPI  The PT is here for follow up and re-evaluation of chronic medical conditions, medication management and review of any available recent lab and radiology data.  Preventive health is updated, specifically  Cancer screening and Immunization.   States she has been to chiropracter for back pain with good results. The PT denies any adverse reactions to current medications since the last visit.  Pt states since past 5 days she ahs been bothered by a headache experienced as her typical migraine, but not responding to what she had for back pain which is ibuprofen, no relief States has been diagnosed with migraines since childhood, has not been really having flares excessively Had pounding headache, bitemporal with nausea, noise light sound and smell bother her, had to be out of work the past 2 days , requesting medication to take that she can take and work with States her last headache was 4 weeks ago last 2 to 3 days May have headache every month, also states she has been stressed about her 2 y/o son since the 2nd week of June Has worked at [employer] loss wit success, and there is also a program through her job   Review of Systems    See HPI Denies recent fever or chills. Denies sinus pressure, nasal congestion, ear pain or sore throat. Denies chest congestion, productive cough or wheezing. Denies chest pains, palpitations and leg swelling Denies abdominal pain, nausea, vomiting,diarrhea or constipation.   Denies dysuria, frequency, hesitancy Denies skin break down or rash.     Objective:   Physical Exam Patient alert and oriented and in no cardiopulmonary distress.  HEENT: No facial asymmetry, EOMI, no sinus tenderness,  oropharynx pink and moist.  Neck supple no adenopathy.  Chest: Clear to auscultation bilaterally.  CVS: S1, S2 no murmurs, no S3.  ABD: Soft non tender. Bowel  sounds normal.  Ext: No edema  MS: Adequate ROM spine, shoulders, hips and knees.  Skin: Intact, no ulcerations or rash noted.  Psych: Good eye contact, normal affect. Memory intact not anxious or depressed appearing.  CNS: CN 2-12 intact, power, tone and sensation normal throughout.        Assessment & Plan:

## 2012-09-04 NOTE — Patient Instructions (Addendum)
F/u in 4 month, call iof you need me before  Weight loss goal of 4 pounds per month  You need to consistently keep account of caloric intake to help with weight loss, and commit to 30 minutes exercise every day  CONGRATS on 11 pound weight loss, new goal is 16 pounds  Fasting lipid, and blood sugar in 4 month, please get labs before visit  New medications for migraine headaches, topamax every night to reduce frequecy and imitrex for acute headache at the onset   Work excuse starting 07/23 end 09/07/2012

## 2012-09-05 NOTE — Assessment & Plan Note (Signed)
Hyperlipidemia:Low fat diet discussed and encouraged.  No medication management, review later in he year

## 2012-09-05 NOTE — Assessment & Plan Note (Signed)
OTC daily vit D supplement recommended

## 2012-09-05 NOTE — Assessment & Plan Note (Signed)
Patient educated about the importance of limiting  Carbohydrate intake , the need to commit to daily physical activity for a minimum of 30 minutes , and to commit weight loss. The fact that changes in all these areas will reduce or eliminate all together the development of diabetes is stressed.    

## 2012-09-05 NOTE — Assessment & Plan Note (Signed)
Improved. Pt applauded on succesful weight loss through lifestyle change, and encouraged to continue same. Weight loss goal set for the next several months.  

## 2012-09-05 NOTE — Assessment & Plan Note (Signed)
5 day h/o uncontrolled headache, unable to work for the past 2 days, has long h/o migraines

## 2012-09-05 NOTE — Assessment & Plan Note (Signed)
Has been to chiropracter for treatment and reports improvement

## 2013-01-01 ENCOUNTER — Ambulatory Visit: Payer: Managed Care, Other (non HMO) | Admitting: Family Medicine

## 2013-07-19 ENCOUNTER — Telehealth: Payer: Self-pay | Admitting: *Deleted

## 2013-07-19 NOTE — Telephone Encounter (Signed)
LMOM x1 AMT

## 2013-07-19 NOTE — Telephone Encounter (Signed)
Burning with urination for a few days last week. Pt states that when she patted herself dry she felt a bump down there. Pt has never noticed it down there before. Pt states that " the area where the urine comes out is swollen to about the size of a tennis ball." Pt states that she can feel it and is very noticeable. Pt was advised that she would need to make an appointment with Dr. Elonda Husky to check and see if uterus has prolapsed. Pt switched up front to make an appointment for this week with Dr. Elonda Husky.

## 2013-07-20 ENCOUNTER — Ambulatory Visit (INDEPENDENT_AMBULATORY_CARE_PROVIDER_SITE_OTHER): Payer: Managed Care, Other (non HMO) | Admitting: Obstetrics & Gynecology

## 2013-07-20 ENCOUNTER — Encounter: Payer: Self-pay | Admitting: Obstetrics & Gynecology

## 2013-07-20 VITALS — BP 130/90 | Ht 64.0 in | Wt 206.0 lb

## 2013-07-20 DIAGNOSIS — N814 Uterovaginal prolapse, unspecified: Secondary | ICD-10-CM

## 2013-07-20 DIAGNOSIS — N813 Complete uterovaginal prolapse: Secondary | ICD-10-CM | POA: Insufficient documentation

## 2013-07-20 NOTE — Progress Notes (Signed)
Patient ID: Bridget Roberts, female   DOB: 1965/04/08, 48 y.o.   MRN: [id number] Pt presents complaining "something is falling out" Symptoms started 13 days ago, before that no problems at all Last time had intercourse was 4-5 months ago and no problems then  ROS No burning with urination, frequency or urgency No nausea, vomiting or diarrhea Nor fever chills or other constitutional symptoms  Exam NEFG Cervix is visible at rest at introitus supine Uterus normal size shape contour retroverted non tender Adnexa negative no masses non tender Anterior and posterior vagina are well supported  Assessment:Plan Grade III uterine prolapse: discussed clinical situation with patient and she wants to proceed with surgical management Seems to be isolated no bladder or rectal prolapse, so no anterior or posterior reconstruction is needed at this time Set up for 08/04/2013  Sonogram preop to make sure ovaies are normal as well as exact uterine size

## 2013-07-29 NOTE — Patient Instructions (Signed)
Bridget Roberts  07/29/2013   Your procedure is scheduled on:  08/04/13  Report to [facility] at 07:00 AM.  Call this number if you have problems the morning of surgery: [phone]   Remember:   Do not eat food or drink liquids after midnight.   Take these medicines the morning of surgery with A SIP OF WATER: Topiramate. You may your Hydrocodone if needed.   Do not wear jewelry, make-up or nail polish.  Do not wear lotions, powders, or perfumes. You may wear deodorant.  Do not shave 48 hours prior to surgery. Men may shave face and neck.  Do not bring valuables to the hospital.  [facility] is not responsible for any belongings or valuables.               Contacts, dentures or bridgework may not be worn into surgery.  Leave suitcase in the car. After surgery it may be brought to your room.  For patients admitted to the hospital, discharge time is determined by your treatment team.               Patients discharged the day of surgery will not be allowed to drive home.   Special Instructions: Shower using Hibiclens the night before surgery and the morning of surgery.   Please read over the following fact sheets that you were given: Anesthesia Post-op Instructions    Hysterectomy Information  A hysterectomy is a surgery in which your uterus is removed. This surgery may be done to treat various medical problems. After the surgery, you will no longer have menstrual periods. The surgery will also make you unable to become pregnant (sterile). The fallopian tubes and ovaries can be removed (bilateral salpingo-oophorectomy) during this surgery as well.  REASONS FOR A HYSTERECTOMY  Persistent, abnormal bleeding.  Lasting (chronic) pelvic pain or infection.  The lining of the uterus (endometrium) starts growing outside the uterus (endometriosis).  The endometrium starts growing in the muscle of the uterus (adenomyosis).  The uterus falls down into the vagina (pelvic organ  prolapse).  Noncancerous growths in the uterus (uterine fibroids) that cause symptoms.  Precancerous cells.  Cervical cancer or uterine cancer. TYPES OF HYSTERECTOMIES  Supracervical hysterectomy--In this type, the top part of the uterus is removed, but not the cervix.  Total hysterectomy--The uterus and cervix are removed.  Radical hysterectomy--The uterus, the cervix, and the fibrous tissue that holds the uterus in place in the pelvis (parametrium) are removed. WAYS A HYSTERECTOMY CAN BE PERFORMED  Abdominal hysterectomy--A large surgical cut (incision) is made in the abdomen. The uterus is removed through this incision.  Vaginal hysterectomy--An incision is made in the vagina. The uterus is removed through this incision. There are no abdominal incisions.  Conventional laparoscopic hysterectomy--Three or four small incisions are made in the abdomen. A thin, lighted tube with a camera (laparoscope) is inserted into one of the incisions. Other tools are put through the other incisions. The uterus is cut into small pieces. The small pieces are removed through the incisions, or they are removed through the vagina.  Laparoscopically assisted vaginal hysterectomy (LAVH)--Three or four small incisions are made in the abdomen. Part of the surgery is performed laparoscopically and part vaginally. The uterus is removed through the vagina.  Robot-assisted laparoscopic hysterectomy--A laparoscope and other tools are inserted into 3 or 4 small incisions in the abdomen. A computer-controlled device is used to give the surgeon a 3D image and to help control the surgical instruments. This  allows for more precise movements of surgical instruments. The uterus is cut into small pieces and removed through the incisions or removed through the vagina. RISKS AND COMPLICATIONS  Possible complications associated with this procedure include:  Bleeding and risk of blood transfusion. Tell your health care provider  if you do not want to receive any blood products.  Blood clots in the legs or lung.  Infection.  Injury to surrounding organs.  Problems or side effects related to anesthesia.  Conversion to an abdominal hysterectomy from one of the other techniques. WHAT TO EXPECT AFTER A HYSTERECTOMY  You will be given pain medicine.  You will need to have someone with you for the first 3-5 days after you go home.  You will need to follow up with your surgeon in 2-4 weeks after surgery to evaluate your progress.  You may have early menopause symptoms such as hot flashes, night sweats, and insomnia.  If you had a hysterectomy for a problem that was not cancer or not a condition that could lead to cancer, then you no longer need Pap tests. However, even if you no longer need a Pap test, a regular exam is a good idea to make sure no other problems are starting. Document Released: 07/24/2000 Document Revised: 11/18/2012 Document Reviewed: 10/05/2012 [facility] Patient Information 2015 [place], [place]. This information is not intended to replace advice given to you by your health care provider. Make sure you discuss any questions you have with your health care provider.    PATIENT INSTRUCTIONS POST-ANESTHESIA  IMMEDIATELY FOLLOWING SURGERY:  Do not drive or operate machinery for the first twenty four hours after surgery.  Do not make any important decisions for twenty four hours after surgery or while taking narcotic pain medications or sedatives.  If you develop intractable nausea and vomiting or a severe headache please notify your doctor immediately.  FOLLOW-UP:  Please make an appointment with your surgeon as instructed. You do not need to follow up with anesthesia unless specifically instructed to do so.  WOUND CARE INSTRUCTIONS (if applicable):  Keep a dry clean dressing on the anesthesia/puncture wound site if there is drainage.  Once the wound has quit draining you may leave it open to air.   Generally you should leave the bandage intact for twenty four hours unless there is drainage.  If the epidural site drains for more than 36-48 hours please call the anesthesia department.  QUESTIONS?:  Please feel free to call your physician or the hospital operator if you have any questions, and they will be happy to assist you.

## 2013-07-30 ENCOUNTER — Encounter (HOSPITAL_COMMUNITY)
Admission: RE | Admit: 2013-07-30 | Discharge: 2013-07-30 | Disposition: A | Discharge status: Home / Self Care | Payer: Managed Care, Other (non HMO) | Source: Ambulatory Visit | Attending: Obstetrics & Gynecology | Admitting: Obstetrics & Gynecology

## 2013-07-30 ENCOUNTER — Encounter (HOSPITAL_COMMUNITY): Payer: Self-pay

## 2013-07-30 ENCOUNTER — Encounter (HOSPITAL_COMMUNITY): Payer: Self-pay | Admitting: Pharmacy Technician

## 2013-07-30 DIAGNOSIS — Z01812 Encounter for preprocedural laboratory examination: Secondary | ICD-10-CM | POA: Insufficient documentation

## 2013-07-30 DIAGNOSIS — Z01818 Encounter for other preprocedural examination: Secondary | ICD-10-CM | POA: Insufficient documentation

## 2013-07-30 HISTORY — DX: Other specified postprocedural states: Z98.890

## 2013-07-30 HISTORY — DX: Nausea with vomiting, unspecified: R11.2

## 2013-07-30 HISTORY — DX: Headache: R51

## 2013-07-30 LAB — URINALYSIS, ROUTINE W REFLEX MICROSCOPIC
Bilirubin Urine: NEGATIVE
Glucose, UA: NEGATIVE mg/dL
HGB URINE DIPSTICK: NEGATIVE
LEUKOCYTES UA: NEGATIVE
NITRITE: NEGATIVE
Urobilinogen, UA: 0.2 mg/dL (ref 0.0–1.0)
pH: 6 (ref 5.0–8.0)

## 2013-07-30 LAB — CBC
HCT: 40.9 % (ref 36.0–46.0)
Hemoglobin: 14 g/dL (ref 12.0–15.0)
MCH: 29.5 pg (ref 26.0–34.0)
MCHC: 34.2 g/dL (ref 30.0–36.0)
MCV: 86.1 fL (ref 78.0–100.0)
Platelets: 292 10*3/uL (ref 150–400)
RBC: 4.75 MIL/uL (ref 3.87–5.11)
RDW: 12.9 % (ref 11.5–15.5)
WBC: 6.9 10*3/uL (ref 4.0–10.5)

## 2013-07-30 LAB — TYPE AND SCREEN
ABO/RH(D): B POS
Antibody Screen: NEGATIVE

## 2013-07-30 LAB — COMPREHENSIVE METABOLIC PANEL
ALT: 12 U/L (ref 0–35)
AST: 15 U/L (ref 0–37)
Albumin: 3.6 g/dL (ref 3.5–5.2)
Alkaline Phosphatase: 56 U/L (ref 39–117)
BUN: 11 mg/dL (ref 6–23)
CALCIUM: 9 mg/dL (ref 8.4–10.5)
CO2: 25 meq/L (ref 19–32)
Chloride: 102 mEq/L (ref 96–112)
Creatinine, Ser: 0.74 mg/dL (ref 0.50–1.10)
GLUCOSE: 106 mg/dL — AB (ref 70–99)
Potassium: 3.8 mEq/L (ref 3.7–5.3)
Sodium: 139 mEq/L (ref 137–147)
Total Bilirubin: 0.3 mg/dL (ref 0.3–1.2)
Total Protein: 6.8 g/dL (ref 6.0–8.3)

## 2013-07-30 LAB — URINE MICROSCOPIC-ADD ON

## 2013-07-30 LAB — HCG, QUANTITATIVE, PREGNANCY: hCG, Beta Chain, Quant, S: <1 m[IU]/mL (ref <5)

## 2013-07-30 NOTE — Pre-Procedure Instructions (Signed)
Patient given information to sign up for my chart at home. 

## 2013-08-04 ENCOUNTER — Encounter (HOSPITAL_COMMUNITY)
Admission: RE | Disposition: A | Discharge status: Home / Self Care | Payer: Self-pay | Source: Ambulatory Visit | Attending: Obstetrics & Gynecology

## 2013-08-04 ENCOUNTER — Encounter (HOSPITAL_COMMUNITY): Payer: Self-pay | Admitting: *Deleted

## 2013-08-04 ENCOUNTER — Ambulatory Visit (HOSPITAL_COMMUNITY): Payer: Managed Care, Other (non HMO) | Admitting: Anesthesiology

## 2013-08-04 ENCOUNTER — Observation Stay (HOSPITAL_COMMUNITY)
Admission: RE | Admit: 2013-08-04 | Discharge: 2013-08-05 | Disposition: A | Discharge status: Home / Self Care | Payer: Managed Care, Other (non HMO) | Source: Ambulatory Visit | Attending: Obstetrics & Gynecology | Admitting: Obstetrics & Gynecology

## 2013-08-04 ENCOUNTER — Encounter (HOSPITAL_COMMUNITY): Payer: Managed Care, Other (non HMO) | Admitting: Anesthesiology

## 2013-08-04 DIAGNOSIS — N856 Intrauterine synechiae: Secondary | ICD-10-CM

## 2013-08-04 DIAGNOSIS — R7309 Other abnormal glucose: Secondary | ICD-10-CM | POA: Insufficient documentation

## 2013-08-04 DIAGNOSIS — Z90710 Acquired absence of both cervix and uterus: Secondary | ICD-10-CM | POA: Diagnosis present

## 2013-08-04 DIAGNOSIS — E785 Hyperlipidemia, unspecified: Secondary | ICD-10-CM | POA: Insufficient documentation

## 2013-08-04 DIAGNOSIS — N814 Uterovaginal prolapse, unspecified: Secondary | ICD-10-CM

## 2013-08-04 DIAGNOSIS — M549 Dorsalgia, unspecified: Secondary | ICD-10-CM | POA: Insufficient documentation

## 2013-08-04 DIAGNOSIS — D251 Intramural leiomyoma of uterus: Secondary | ICD-10-CM

## 2013-08-04 DIAGNOSIS — E559 Vitamin D deficiency, unspecified: Secondary | ICD-10-CM | POA: Insufficient documentation

## 2013-08-04 DIAGNOSIS — E669 Obesity, unspecified: Secondary | ICD-10-CM | POA: Insufficient documentation

## 2013-08-04 DIAGNOSIS — G43909 Migraine, unspecified, not intractable, without status migrainosus: Secondary | ICD-10-CM | POA: Insufficient documentation

## 2013-08-04 DIAGNOSIS — K219 Gastro-esophageal reflux disease without esophagitis: Secondary | ICD-10-CM | POA: Insufficient documentation

## 2013-08-04 DIAGNOSIS — N7013 Chronic salpingitis and oophoritis: Secondary | ICD-10-CM | POA: Insufficient documentation

## 2013-08-04 HISTORY — PX: VAGINAL HYSTERECTOMY: SHX2639

## 2013-08-04 SURGERY — HYSTERECTOMY, VAGINAL
Anesthesia: General | Site: Vagina

## 2013-08-04 MED ORDER — DEXAMETHASONE SODIUM PHOSPHATE 4 MG/ML IJ SOLN
4.0000 mg | Freq: Once | INTRAMUSCULAR | Status: AC
  Administered 2013-08-04: 4 mg via INTRAVENOUS

## 2013-08-04 MED ORDER — SUCCINYLCHOLINE CHLORIDE 20 MG/ML IJ SOLN
INTRAMUSCULAR | Status: DC | PRN
  Administered 2013-08-04: 160 mg via INTRAVENOUS

## 2013-08-04 MED ORDER — ONDANSETRON 8 MG/NS 50 ML IVPB
8.0000 mg | Freq: Four times a day (QID) | INTRAVENOUS | Status: DC | PRN
  Filled 2013-08-04: qty 8

## 2013-08-04 MED ORDER — OXYCODONE-ACETAMINOPHEN 5-325 MG PO TABS
1.0000 | ORAL_TABLET | ORAL | Status: DC | PRN
  Administered 2013-08-04: 2 via ORAL
  Administered 2013-08-04: 1 via ORAL
  Administered 2013-08-05: 2 via ORAL
  Filled 2013-08-04: qty 2
  Filled 2013-08-04: qty 1
  Filled 2013-08-04: qty 2

## 2013-08-04 MED ORDER — SODIUM CHLORIDE 0.9 % IN NEBU
INHALATION_SOLUTION | RESPIRATORY_TRACT | Status: AC
  Filled 2013-08-04: qty 3

## 2013-08-04 MED ORDER — GLYCOPYRROLATE 0.2 MG/ML IJ SOLN
0.2000 mg | Freq: Once | INTRAMUSCULAR | Status: AC
  Administered 2013-08-04: 0.2 mg via INTRAVENOUS

## 2013-08-04 MED ORDER — MIDAZOLAM HCL 5 MG/5ML IJ SOLN
INTRAMUSCULAR | Status: DC | PRN
  Administered 2013-08-04: 2 mg via INTRAVENOUS

## 2013-08-04 MED ORDER — ALBUTEROL SULFATE (2.5 MG/3ML) 0.083% IN NEBU
INHALATION_SOLUTION | RESPIRATORY_TRACT | Status: AC
  Filled 2013-08-04: qty 3

## 2013-08-04 MED ORDER — LACTATED RINGERS IV SOLN
INTRAVENOUS | Status: DC
  Administered 2013-08-04 (×3): via INTRAVENOUS

## 2013-08-04 MED ORDER — GLYCOPYRROLATE 0.2 MG/ML IJ SOLN
INTRAMUSCULAR | Status: AC
  Filled 2013-08-04: qty 3

## 2013-08-04 MED ORDER — FENTANYL CITRATE 0.05 MG/ML IJ SOLN: 25.0000 ug | INTRAMUSCULAR | Status: DC | PRN

## 2013-08-04 MED ORDER — MIDAZOLAM HCL 2 MG/2ML IJ SOLN
INTRAMUSCULAR | Status: AC
  Filled 2013-08-04: qty 2

## 2013-08-04 MED ORDER — STERILE WATER FOR IRRIGATION IR SOLN
Status: DC | PRN
  Administered 2013-08-04: 1000 mL

## 2013-08-04 MED ORDER — PROPOFOL 10 MG/ML IV BOLUS
INTRAVENOUS | Status: DC | PRN
  Administered 2013-08-04: 160 mg via INTRAVENOUS

## 2013-08-04 MED ORDER — NEOSTIGMINE METHYLSULFATE 10 MG/10ML IV SOLN
INTRAVENOUS | Status: AC
  Filled 2013-08-04: qty 1

## 2013-08-04 MED ORDER — MIDAZOLAM HCL 2 MG/2ML IJ SOLN
1.0000 mg | INTRAMUSCULAR | Status: DC | PRN
  Administered 2013-08-04: 2 mg via INTRAVENOUS

## 2013-08-04 MED ORDER — FENTANYL CITRATE 0.05 MG/ML IJ SOLN
INTRAMUSCULAR | Status: AC
  Filled 2013-08-04: qty 5

## 2013-08-04 MED ORDER — ALBUTEROL SULFATE (2.5 MG/3ML) 0.083% IN NEBU
2.5000 mg | INHALATION_SOLUTION | Freq: Once | RESPIRATORY_TRACT | Status: AC
  Administered 2013-08-04: 2.5 mg via RESPIRATORY_TRACT

## 2013-08-04 MED ORDER — KCL IN DEXTROSE-NACL 20-5-0.45 MEQ/L-%-% IV SOLN
INTRAVENOUS | Status: DC
  Administered 2013-08-04: 14:00:00 via INTRAVENOUS

## 2013-08-04 MED ORDER — DOCUSATE SODIUM 100 MG PO CAPS
100.0000 mg | ORAL_CAPSULE | Freq: Two times a day (BID) | ORAL | Status: DC
  Administered 2013-08-04 – 2013-08-05 (×3): 100 mg via ORAL
  Filled 2013-08-04 (×3): qty 1

## 2013-08-04 MED ORDER — ROCURONIUM BROMIDE 50 MG/5ML IV SOLN
INTRAVENOUS | Status: AC
  Filled 2013-08-04: qty 1

## 2013-08-04 MED ORDER — ONDANSETRON HCL 4 MG/2ML IJ SOLN
INTRAMUSCULAR | Status: AC
  Filled 2013-08-04: qty 2

## 2013-08-04 MED ORDER — SCOPOLAMINE 1 MG/3DAYS TD PT72
MEDICATED_PATCH | TRANSDERMAL | Status: AC
  Filled 2013-08-04: qty 1

## 2013-08-04 MED ORDER — SCOPOLAMINE 1 MG/3DAYS TD PT72
1.0000 | MEDICATED_PATCH | Freq: Once | TRANSDERMAL | Status: DC
  Administered 2013-08-04: 1.5 mg via TRANSDERMAL

## 2013-08-04 MED ORDER — ALUM & MAG HYDROXIDE-SIMETH 200-200-20 MG/5ML PO SUSP: 30.0000 mL | ORAL | Status: DC | PRN

## 2013-08-04 MED ORDER — KETOROLAC TROMETHAMINE 30 MG/ML IJ SOLN
30.0000 mg | Freq: Once | INTRAMUSCULAR | Status: AC
  Administered 2013-08-04: 30 mg via INTRAVENOUS
  Filled 2013-08-04: qty 1

## 2013-08-04 MED ORDER — CEFAZOLIN SODIUM-DEXTROSE 2-3 GM-% IV SOLR
2.0000 g | INTRAVENOUS | Status: AC
  Administered 2013-08-04: 2 g via INTRAVENOUS
  Filled 2013-08-04: qty 50

## 2013-08-04 MED ORDER — ONDANSETRON HCL 4 MG/2ML IJ SOLN
4.0000 mg | Freq: Once | INTRAMUSCULAR | Status: AC
  Administered 2013-08-04: 4 mg via INTRAVENOUS

## 2013-08-04 MED ORDER — HYDROMORPHONE HCL PF 1 MG/ML IJ SOLN
1.0000 mg | INTRAMUSCULAR | Status: DC | PRN
  Administered 2013-08-04 – 2013-08-05 (×3): 1 mg via INTRAVENOUS
  Filled 2013-08-04 (×3): qty 1

## 2013-08-04 MED ORDER — SODIUM CHLORIDE 0.9 % IR SOLN
Status: DC | PRN
  Administered 2013-08-04: 1000 mL

## 2013-08-04 MED ORDER — PROPOFOL 10 MG/ML IV BOLUS
INTRAVENOUS | Status: AC
  Filled 2013-08-04: qty 20

## 2013-08-04 MED ORDER — ONDANSETRON HCL 4 MG/2ML IJ SOLN: 4.0000 mg | Freq: Once | INTRAMUSCULAR | Status: DC | PRN

## 2013-08-04 MED ORDER — ZOLPIDEM TARTRATE 5 MG PO TABS: 5.0000 mg | ORAL_TABLET | Freq: Every evening | ORAL | Status: DC | PRN

## 2013-08-04 MED ORDER — GLYCOPYRROLATE 0.2 MG/ML IJ SOLN
INTRAMUSCULAR | Status: DC | PRN
  Administered 2013-08-04: 0.6 mg via INTRAVENOUS

## 2013-08-04 MED ORDER — DEXAMETHASONE SODIUM PHOSPHATE 4 MG/ML IJ SOLN
INTRAMUSCULAR | Status: AC
  Filled 2013-08-04: qty 1

## 2013-08-04 MED ORDER — BUPIVACAINE-EPINEPHRINE (PF) 0.5% -1:200000 IJ SOLN
INTRAMUSCULAR | Status: AC
  Filled 2013-08-04: qty 30

## 2013-08-04 MED ORDER — FENTANYL CITRATE 0.05 MG/ML IJ SOLN
INTRAMUSCULAR | Status: DC | PRN
  Administered 2013-08-04: 100 ug via INTRAVENOUS
  Administered 2013-08-04 (×4): 50 ug via INTRAVENOUS

## 2013-08-04 MED ORDER — TOPIRAMATE 25 MG PO TABS
25.0000 mg | ORAL_TABLET | Freq: Every day | ORAL | Status: DC
  Administered 2013-08-04 – 2013-08-05 (×2): 25 mg via ORAL
  Filled 2013-08-04 (×3): qty 1

## 2013-08-04 MED ORDER — BUPIVACAINE-EPINEPHRINE (PF) 0.5% -1:200000 IJ SOLN
INTRAMUSCULAR | Status: DC | PRN
  Administered 2013-08-04: 7 mL

## 2013-08-04 MED ORDER — ROCURONIUM BROMIDE 100 MG/10ML IV SOLN
INTRAVENOUS | Status: DC | PRN
  Administered 2013-08-04: 10 mg via INTRAVENOUS
  Administered 2013-08-04: 30 mg via INTRAVENOUS

## 2013-08-04 MED ORDER — LIDOCAINE HCL 1 % IJ SOLN
INTRAMUSCULAR | Status: DC | PRN
  Administered 2013-08-04: 50 mg via INTRADERMAL

## 2013-08-04 MED ORDER — NEOSTIGMINE METHYLSULFATE 10 MG/10ML IV SOLN
INTRAVENOUS | Status: DC | PRN
  Administered 2013-08-04: 4 mg via INTRAVENOUS

## 2013-08-04 MED ORDER — ONDANSETRON HCL 4 MG PO TABS
8.0000 mg | ORAL_TABLET | Freq: Four times a day (QID) | ORAL | Status: DC | PRN
  Administered 2013-08-04: 8 mg via ORAL
  Filled 2013-08-04: qty 2

## 2013-08-04 MED ORDER — LIDOCAINE HCL (PF) 1 % IJ SOLN
INTRAMUSCULAR | Status: AC
  Filled 2013-08-04: qty 5

## 2013-08-04 MED ORDER — GLYCOPYRROLATE 0.2 MG/ML IJ SOLN
INTRAMUSCULAR | Status: AC
  Filled 2013-08-04: qty 1

## 2013-08-04 SURGICAL SUPPLY — 44 items
APPLIER CLIP 13 LRG OPEN (CLIP) ×2
APR CLP LRG 13 20 CLIP (CLIP) ×1
BAG HAMPER (MISCELLANEOUS) ×2 IMPLANT
CLIP APPLIE 13 LRG OPEN (CLIP) IMPLANT
CLOTH BEACON ORANGE TIMEOUT ST (SAFETY) ×2 IMPLANT
COVER LIGHT HANDLE STERIS (MISCELLANEOUS) ×4 IMPLANT
COVER MAYO STAND XLG (DRAPE) ×2 IMPLANT
DECANTER SPIKE VIAL GLASS SM (MISCELLANEOUS) ×2 IMPLANT
DRAPE PROXIMA HALF (DRAPES) ×2 IMPLANT
DRAPE STERI URO 9X17 APER PCH (DRAPES) ×2 IMPLANT
ELECT REM PT RETURN 9FT ADLT (ELECTROSURGICAL) ×2
ELECTRODE REM PT RTRN 9FT ADLT (ELECTROSURGICAL) ×1 IMPLANT
FORMALIN 10 PREFIL 480ML (MISCELLANEOUS) ×2 IMPLANT
GAUZE PACKING 2X5 YD STRL (GAUZE/BANDAGES/DRESSINGS) IMPLANT
GLOVE BIOGEL PI IND STRL 7.0 (GLOVE) IMPLANT
GLOVE BIOGEL PI IND STRL 8 (GLOVE) ×1 IMPLANT
GLOVE BIOGEL PI INDICATOR 7.0 (GLOVE) ×2
GLOVE BIOGEL PI INDICATOR 8 (GLOVE) ×1
GLOVE ECLIPSE 6.5 STRL STRAW (GLOVE) ×2 IMPLANT
GLOVE ECLIPSE 8.0 STRL XLNG CF (GLOVE) ×2 IMPLANT
GLOVE EXAM NITRILE PF LG BLUE (GLOVE) ×1 IMPLANT
GLOVE SS BIOGEL STRL SZ 6.5 (GLOVE) IMPLANT
GLOVE SUPERSENSE BIOGEL SZ 6.5 (GLOVE) ×1
GOWN STRL REUS W/TWL LRG LVL3 (GOWN DISPOSABLE) ×5 IMPLANT
GOWN STRL REUS W/TWL XL LVL3 (GOWN DISPOSABLE) ×2 IMPLANT
IV NS 1000ML (IV SOLUTION) ×2
IV NS 1000ML BAXH (IV SOLUTION) IMPLANT
IV NS IRRIG 3000ML ARTHROMATIC (IV SOLUTION) ×1 IMPLANT
KIT ROOM TURNOVER AP CYSTO (KITS) ×2 IMPLANT
MANIFOLD NEPTUNE II (INSTRUMENTS) ×2 IMPLANT
NDL HYPO 25X1 1.5 SAFETY (NEEDLE) ×1 IMPLANT
NEEDLE HYPO 25X1 1.5 SAFETY (NEEDLE) ×2 IMPLANT
NS IRRIG 1000ML POUR BTL (IV SOLUTION) ×2 IMPLANT
PACK PERI GYN (CUSTOM PROCEDURE TRAY) ×2 IMPLANT
PAD ARMBOARD 7.5X6 YLW CONV (MISCELLANEOUS) ×2 IMPLANT
SET BASIN LINEN APH (SET/KITS/TRAYS/PACK) ×2 IMPLANT
SUT MNCRL+ AB 3-0 CT1 36 (SUTURE) IMPLANT
SUT MON AB 3-0 SH 27 (SUTURE) IMPLANT
SUT MONOCRYL AB 3-0 CT1 36IN (SUTURE) ×1
SUT VIC AB 0 CT1 27 (SUTURE) ×6
SUT VIC AB 0 CT1 27XCR 8 STRN (SUTURE) ×2 IMPLANT
SYR CONTROL 10ML LL (SYRINGE) ×2 IMPLANT
TRAY FOLEY CATH 16FR SILVER (SET/KITS/TRAYS/PACK) ×2 IMPLANT
VERSALIGHT (MISCELLANEOUS) ×2 IMPLANT

## 2013-08-04 NOTE — Transfer of Care (Signed)
Immediate Anesthesia Transfer of Care Note  Patient: Bridget Roberts  Procedure(s) Performed: Procedure(s): HYSTERECTOMY VAGINAL (N/A)  Patient Location: PACU  Anesthesia Type:General  Level of Consciousness: sedated  Airway & Oxygen Therapy: Patient Spontanous Breathing and Patient connected to face mask oxygen  Post-op Assessment: Report given to PACU RN and Post -op Vital signs reviewed and stable  Post vital signs: Reviewed and stable  Complications: No apparent anesthesia complications

## 2013-08-04 NOTE — Anesthesia Postprocedure Evaluation (Signed)
  Anesthesia Post-op Note  Patient: Bridget Roberts  Procedure(s) Performed: Procedure(s): HYSTERECTOMY VAGINAL (N/A)  Patient Location: PACU  Anesthesia Type:General  Level of Consciousness: awake, alert , oriented and patient cooperative  Airway and Oxygen Therapy: Patient Spontanous Breathing  Post-op Pain: 3 /10, mild  Post-op Assessment: Post-op Vital signs reviewed, Patient's Cardiovascular Status Stable, Respiratory Function Stable, Patent Airway, No signs of Nausea or vomiting and Pain level controlled  Post-op Vital Signs: Reviewed and stable  Last Vitals:  Filed Vitals:   08/04/13 0815  BP: 128/89  Pulse:   Temp:   Resp: 42    Complications: No apparent anesthesia complications

## 2013-08-04 NOTE — Op Note (Signed)
Preoperative diagnosis:  1.  Grade III uterine prolapse, acute                                         2.  No anterior or posterior compartment relaxation or prolapse                                         3.                                           4.    Postoperative diagnosis:  Same as above + left hydrosalpinx  Procedure:  Vaginal hysterectomy  Surgeon:  Florian Buff MD  Anesthesia:  General Endotracheal  Findings:  Small uterine myomata, 1cm or so, left hydrosalpinx, not active, Grade III midcompartment prolapse without anterior or posterior compartment prolapse    Description of operation:  The patient was taken from the preoperative area to the operating room in stable condition. She was placed in the sitting position and underwent a spinal anesthetic. Once an adequate level of anesthesia was attained she was placed in the dorsal lithotomy position. Patient was prepped and draped in the usual sterile fashion and a Foley catheter was placed.  A weighted speculum was placed and the cervix was grasped with thyroid tenaculums both anteriorly and posteriorly.  0.5% Marcaine plain was injected in a circumferential fashion about the cervix and the electrocautery unit was used to incise the vagina and push at all cervix.  The posterior cul-de-sac was then entered sharply without difficulty.  The uterosacral ligaments were clamped cut and inspection suture ligated and held.  The cardinal ligaments were then clamped cut transfixion suture ligated and cut. The anterior peritoneum was identified the anterior cul-de-sac was entered sharply without difficulty. The anterior and posterior leaves of the broad ligament were plicated and the uterine vessels were clamped cut and suture ligated. Serial pedicles were taken of the fundus with each pedicle being clamped cut and suture ligated. The utero-ovarian ligaments were crossclamped the uterus was removed and both pedicles were transfixion suture ligated.  There was good hemostasis of all the pedicles.  The patient did have a non infected hydrosalpinx of the left Fallopian tube The peritoneum was then closed in a pursestring fashion using 3-0 Vicryl. The anterior posterior vagina was closed in interrupted fashion with good resultant hemostasis. Prior to  closure the lower pelvis and vagina were irrigated vigorously.  The sponge needle and instrument counts were correct x 3.  Total blood loss for the procedure was 100 cc.  The patient received 2 g of Ancef and 30 mg of Toradol IV preoperatively prophylactically.  She was taken to the recovery room in good stable condition awake alert doing well.  Bridget Roberts,Bridget Roberts 08/04/2013, 10:15 AM

## 2013-08-04 NOTE — Anesthesia Procedure Notes (Signed)
Procedure Name: Intubation Date/Time: 08/04/2013 8:41 AM Performed by: Charmaine Downs Pre-anesthesia Checklist: Patient being monitored, Suction available, Emergency Drugs available and Patient identified Patient Re-evaluated:Patient Re-evaluated prior to inductionOxygen Delivery Method: Circle system utilized Preoxygenation: Pre-oxygenation with 100% oxygen Intubation Type: IV induction, Cricoid Pressure applied and Rapid sequence Ventilation: Mask ventilation without difficulty Laryngoscope Size: Mac and 3 Grade View: Grade II Tube type: Oral Tube size: 7.0 mm Number of attempts: 1 Airway Equipment and Method: Stylet Placement Confirmation: ETT inserted through vocal cords under direct vision,  breath sounds checked- equal and bilateral and positive ETCO2 Secured at: 22 cm Tube secured with: Tape Dental Injury: Teeth and Oropharynx as per pre-operative assessment  Difficulty Due To: Difficulty was anticipated, Difficult Airway- due to limited oral opening and Difficult Airway- due to anterior larynx

## 2013-08-04 NOTE — Anesthesia Preprocedure Evaluation (Signed)
Anesthesia Evaluation  Patient identified by MRN, date of birth, ID band Patient awake    Reviewed: Allergy & Precautions, H&P , NPO status , Patient's Chart, lab work & pertinent test results  History of Anesthesia Complications (+) PONV and history of anesthetic complications  Airway Mallampati: II TM Distance: >3 FB Neck ROM: Full    Dental  (+) Teeth Intact, Partial Upper   Pulmonary  breath sounds clear to auscultation        Cardiovascular negative cardio ROS  Rhythm:Regular Rate:Normal     Neuro/Psych  Headaches,    GI/Hepatic GERD-  Medicated and Controlled,  Endo/Other    Renal/GU      Musculoskeletal   Abdominal   Peds  Hematology   Anesthesia Other Findings   Reproductive/Obstetrics                           Anesthesia Physical Anesthesia Plan  ASA: II  Anesthesia Plan: General   Post-op Pain Management:    Induction: Intravenous, Rapid sequence and Cricoid pressure planned  Airway Management Planned: Oral ETT  Additional Equipment:   Intra-op Plan:   Post-operative Plan: Extubation in OR  Informed Consent: I have reviewed the patients History and Physical, chart, labs and discussed the procedure including the risks, benefits and alternatives for the proposed anesthesia with the patient or authorized representative who has indicated his/her understanding and acceptance.     Plan Discussed with:   Anesthesia Plan Comments: (Albuterol jet neb preop)        Anesthesia Quick Evaluation

## 2013-08-04 NOTE — H&P (Signed)
Preoperative History and Physical  Bridget Roberts is a 48 y.o. No obstetric history on file. with No LMP recorded. Patient has had an ablation. admitted for a vaginal hysterectomy.  Pt has isolated uterine prolapse, Grade III, no anterior or posterior compartment descent.  As a result will proceed with vaginal hysterectomy  PMH:    Past Medical History  Diagnosis Date  . GERD (gastroesophageal reflux disease)   . Back pain   . PONV (postoperative nausea and vomiting)   . Headache(784.0)     PSH:     Past Surgical History  Procedure Laterality Date  . Salpingectomy  right    unilateral  . Vocal chord polyp    . Polypectomy      vocal chords  . Endometrial ablation    . Ectopic pregnancy surgery      POb/GynH:      OB History   Grav Para Term Preterm Abortions TAB SAB Ect Mult Living                  SH:   History  Substance Use Topics  . Smoking status: Never Smoker   . Smokeless tobacco: Not on file  . Alcohol Use: No    FH:    Family History  Problem Relation Age of Onset  . Prostate cancer Father   . Heart attack Mother      Allergies:  Allergies  Allergen Reactions  . Penicillins     Medications:      Current facility-administered medications:ceFAZolin (ANCEF) IVPB 2 g/50 mL premix, 2 g, Intravenous, On Call to OR, Florian Buff, MD;  lactated ringers infusion, , Intravenous, Continuous, Lerry Liner, MD, Last Rate: 75 mL/hr at 08/04/13 0803;  midazolam (VERSED) injection 1-2 mg, 1-2 mg, Intravenous, Q5 Min x 3 PRN, Lerry Liner, MD scopolamine (TRANSDERM-SCOP) 1 MG/3DAYS 1.5 mg, 1 patch, Transdermal, Once, Lerry Liner, MD, 1.5 mg at 08/04/13 3151  Review of Systems:   Review of Systems  Constitutional: Negative for fever, chills, weight loss, malaise/fatigue and diaphoresis.  HENT: Negative for hearing loss, ear pain, nosebleeds, congestion, sore throat, neck pain, tinnitus and ear discharge.   Eyes: Negative for blurred vision, double vision,  photophobia, pain, discharge and redness.  Respiratory: Negative for cough, hemoptysis, sputum production, shortness of breath, wheezing and stridor.   Cardiovascular: Negative for chest pain, palpitations, orthopnea, claudication, leg swelling and PND.  Gastrointestinal: negative for abdominal pain. Negative for heartburn, nausea, vomiting, diarrhea, constipation, blood in stool and melena.  Genitourinary: Negative for dysuria, urgency, frequency, hematuria and flank pain.  Musculoskeletal: Negative for myalgias, back pain, joint pain and falls.  Skin: Negative for itching and rash.  Neurological: Negative for dizziness, tingling, tremors, sensory change, speech change, focal weakness, seizures, loss of consciousness, weakness and headaches.  Endo/Heme/Allergies: Negative for environmental allergies and polydipsia. Does not bruise/bleed easily.  Psychiatric/Behavioral: Negative for depression, suicidal ideas, hallucinations, memory loss and substance abuse. The patient is not nervous/anxious and does not have insomnia.      PHYSICAL EXAM:  Blood pressure 140/89, pulse 62, temperature 98.5 F (36.9 C), temperature source Oral, resp. rate 18, SpO2 98.00%.    Vitals reviewed. Constitutional: She is oriented to person, place, and time. She appears well-developed and well-nourished.  HENT:  Head: Normocephalic and atraumatic.  Right Ear: External ear normal.  Left Ear: External ear normal.  Nose: Nose normal.  Mouth/Throat: Oropharynx is clear and moist.  Eyes: Conjunctivae and EOM are normal. Pupils are equal,  round, and reactive to light. Right eye exhibits no discharge. Left eye exhibits no discharge. No scleral icterus.  Neck: Normal range of motion. Neck supple. No tracheal deviation present. No thyromegaly present.  Cardiovascular: Normal rate, regular rhythm, normal heart sounds and intact distal pulses.  Exam reveals no gallop and no friction rub.   No murmur heard. Respiratory:  Effort normal and breath sounds normal. No respiratory distress. She has no wheezes. She has no rales. She exhibits no tenderness.  GI: Soft. Bowel sounds are normal. She exhibits no distension and no mass. There is tenderness. There is no rebound and no guarding.  Genitourinary:       Vulva is normal without lesions Vagina is pink moist without discharge Cervix normal in appearance and pap is normal Uterus is significant for Grade III prolapse Adnexa is negative with normal sized ovaries by sonogram  Musculoskeletal: Normal range of motion. She exhibits no edema and no tenderness.  Neurological: She is alert and oriented to person, place, and time. She has normal reflexes. She displays normal reflexes. No cranial nerve deficit. She exhibits normal muscle tone. Coordination normal.  Skin: Skin is warm and dry. No rash noted. No erythema. No pallor.  Psychiatric: She has a normal mood and affect. Her behavior is normal. Judgment and thought content normal.    Labs: Results for orders placed during the hospital encounter of 07/30/13 (from the past 336 hour(s))  URINALYSIS, ROUTINE W REFLEX MICROSCOPIC   Collection Time    07/30/13 11:25 AM      Result Value Ref Range   Color, Urine YELLOW  YELLOW   APPearance CLEAR  CLEAR   Specific Gravity, Urine >1.030 (*) 1.005 - 1.030   pH 6.0  5.0 - 8.0   Glucose, UA NEGATIVE  NEGATIVE mg/dL   Hgb urine dipstick NEGATIVE  NEGATIVE   Bilirubin Urine NEGATIVE  NEGATIVE   Ketones, ur TRACE (*) NEGATIVE mg/dL   Protein, ur TRACE (*) NEGATIVE mg/dL   Urobilinogen, UA 0.2  0.0 - 1.0 mg/dL   Nitrite NEGATIVE  NEGATIVE   Leukocytes, UA NEGATIVE  NEGATIVE  URINE MICROSCOPIC-ADD ON   Collection Time    07/30/13 11:25 AM      Result Value Ref Range   Squamous Epithelial / LPF MANY (*) RARE   WBC, UA 3-6  <3 WBC/hpf   RBC / HPF 3-6  <3 RBC/hpf   Bacteria, UA MANY (*) RARE  CBC   Collection Time    07/30/13 11:30 AM      Result Value Ref Range   WBC  6.9  4.0 - 10.5 K/uL   RBC 4.75  3.87 - 5.11 MIL/uL   Hemoglobin 14.0  12.0 - 15.0 g/dL   HCT 40.9  36.0 - 46.0 %   MCV 86.1  78.0 - 100.0 fL   MCH 29.5  26.0 - 34.0 pg   MCHC 34.2  30.0 - 36.0 g/dL   RDW 12.9  11.5 - 15.5 %   Platelets 292  150 - 400 K/uL  COMPREHENSIVE METABOLIC PANEL   Collection Time    07/30/13 11:30 AM      Result Value Ref Range   Sodium 139  137 - 147 mEq/L   Potassium 3.8  3.7 - 5.3 mEq/L   Chloride 102  96 - 112 mEq/L   CO2 25  19 - 32 mEq/L   Glucose, Bld 106 (*) 70 - 99 mg/dL   BUN 11  6 - 23 mg/dL  Creatinine, Ser 0.74  0.50 - 1.10 mg/dL   Calcium 9.0  8.4 - 10.5 mg/dL   Total Protein 6.8  6.0 - 8.3 g/dL   Albumin 3.6  3.5 - 5.2 g/dL   AST 15  0 - 37 U/L   ALT 12  0 - 35 U/L   Alkaline Phosphatase 56  39 - 117 U/L   Total Bilirubin 0.3  0.3 - 1.2 mg/dL   GFR calc non Af Amer >90  >90 mL/min   GFR calc Af Amer >90  >90 mL/min  HCG, QUANTITATIVE, PREGNANCY   Collection Time    07/30/13 11:30 AM      Result Value Ref Range   hCG, Beta Chain, Quant, S <1  <5 mIU/mL  TYPE AND SCREEN   Collection Time    07/30/13 11:30 AM      Result Value Ref Range   ABO/RH(D) B POS     Antibody Screen NEG     Sample Expiration 08/13/2013      EKG: No orders found for this or any previous visit.  Imaging Studies: No results found.    Assessment: Patient Active Problem List   Diagnosis Date Noted  . Third degree uterine prolapse 07/20/2013  . Migraine headache 09/04/2012  . IGT (impaired glucose tolerance) 05/11/2012  . Other and unspecified hyperlipidemia 05/11/2012  . Abnormal vaginal bleeding 04/18/2011  . Vitamin D deficiency 04/11/2011  . OBESITY, UNSPECIFIED 06/01/2007  . BACK PAIN 06/01/2007    Plan: Vaginal hysterectomy  EURE,LUTHER H 08/04/2013 8:15 AM

## 2013-08-05 ENCOUNTER — Other Ambulatory Visit: Payer: Self-pay | Admitting: Obstetrics & Gynecology

## 2013-08-05 ENCOUNTER — Encounter (HOSPITAL_COMMUNITY): Payer: Self-pay | Admitting: Obstetrics & Gynecology

## 2013-08-05 LAB — BASIC METABOLIC PANEL
BUN: 9 mg/dL (ref 6–23)
CALCIUM: 8.6 mg/dL (ref 8.4–10.5)
CO2: 27 mEq/L (ref 19–32)
CREATININE: 0.77 mg/dL (ref 0.50–1.10)
Chloride: 105 mEq/L (ref 96–112)
Glucose, Bld: 102 mg/dL — ABNORMAL HIGH (ref 70–99)
Potassium: 4.2 mEq/L (ref 3.7–5.3)
Sodium: 140 mEq/L (ref 137–147)

## 2013-08-05 LAB — CBC
HEMATOCRIT: 38.3 % (ref 36.0–46.0)
Hemoglobin: 12.9 g/dL (ref 12.0–15.0)
MCH: 29.3 pg (ref 26.0–34.0)
MCHC: 33.7 g/dL (ref 30.0–36.0)
MCV: 86.8 fL (ref 78.0–100.0)
PLATELETS: 260 10*3/uL (ref 150–400)
RBC: 4.41 MIL/uL (ref 3.87–5.11)
RDW: 13 % (ref 11.5–15.5)
WBC: 10.4 10*3/uL (ref 4.0–10.5)

## 2013-08-05 MED ORDER — CIPROFLOXACIN HCL 500 MG PO TABS: 500.0000 mg | ORAL_TABLET | Freq: Two times a day (BID) | ORAL | Status: DC

## 2013-08-05 MED ORDER — OXYCODONE-ACETAMINOPHEN 5-325 MG PO TABS: 1.0000 | ORAL_TABLET | ORAL | Status: DC | PRN

## 2013-08-05 NOTE — Discharge Instructions (Signed)
Hysterectomy Information  A hysterectomy is a surgery in which your uterus is removed. This surgery may be done to treat various medical problems. After the surgery, you will no longer have menstrual periods. The surgery will also make you unable to become pregnant (sterile). The fallopian tubes and ovaries can be removed (bilateral salpingo-oophorectomy) during this surgery as well.  REASONS FOR A HYSTERECTOMY  Persistent, abnormal bleeding.  Lasting (chronic) pelvic pain or infection.  The lining of the uterus (endometrium) starts growing outside the uterus (endometriosis).  The endometrium starts growing in the muscle of the uterus (adenomyosis).  The uterus falls down into the vagina (pelvic organ prolapse).  Noncancerous growths in the uterus (uterine fibroids) that cause symptoms.  Precancerous cells.  Cervical cancer or uterine cancer. TYPES OF HYSTERECTOMIES  Supracervical hysterectomy--In this type, the top part of the uterus is removed, but not the cervix.  Total hysterectomy--The uterus and cervix are removed.  Radical hysterectomy--The uterus, the cervix, and the fibrous tissue that holds the uterus in place in the pelvis (parametrium) are removed. WAYS A HYSTERECTOMY CAN BE PERFORMED  Abdominal hysterectomy--A large surgical cut (incision) is made in the abdomen. The uterus is removed through this incision.  Vaginal hysterectomy--An incision is made in the vagina. The uterus is removed through this incision. There are no abdominal incisions.  Conventional laparoscopic hysterectomy--Three or four small incisions are made in the abdomen. A thin, lighted tube with a camera (laparoscope) is inserted into one of the incisions. Other tools are put through the other incisions. The uterus is cut into small pieces. The small pieces are removed through the incisions, or they are removed through the vagina.  Laparoscopically assisted vaginal hysterectomy (LAVH)--Three or four  small incisions are made in the abdomen. Part of the surgery is performed laparoscopically and part vaginally. The uterus is removed through the vagina.  Robot-assisted laparoscopic hysterectomy--A laparoscope and other tools are inserted into 3 or 4 small incisions in the abdomen. A computer-controlled device is used to give the surgeon a 3D image and to help control the surgical instruments. This allows for more precise movements of surgical instruments. The uterus is cut into small pieces and removed through the incisions or removed through the vagina. RISKS AND COMPLICATIONS  Possible complications associated with this procedure include:  Bleeding and risk of blood transfusion. Tell your health care provider if you do not want to receive any blood products.  Blood clots in the legs or lung.  Infection.  Injury to surrounding organs.  Problems or side effects related to anesthesia.  Conversion to an abdominal hysterectomy from one of the other techniques. WHAT TO EXPECT AFTER A HYSTERECTOMY  You will be given pain medicine.  You will need to have someone with you for the first 3-5 days after you go home.  You will need to follow up with your surgeon in 2-4 weeks after surgery to evaluate your progress.  You may have early menopause symptoms such as hot flashes, night sweats, and insomnia.  If you had a hysterectomy for a problem that was not cancer or not a condition that could lead to cancer, then you no longer need Pap tests. However, even if you no longer need a Pap test, a regular exam is a good idea to make sure no other problems are starting. Document Released: 07/24/2000 Document Revised: 11/18/2012 Document Reviewed: 10/05/2012 [facility] Patient Information 2015 [place], [place]. This information is not intended to replace advice given to you by your health care  provider. Make sure you discuss any questions you have with your health care provider. ° °

## 2013-08-05 NOTE — Progress Notes (Signed)
IV removed, site WNL.  Pt given d/c instructions and new prescriptions.  Discussed all home medications (when, how, and why to take), patient verbalizes understanding. Discussed post-op home care with patient, teachback completed. F/U appointment in place, pt states they will keep appointment. Pt is stable at this time. Pt waiting on her brother to come for transportation home.

## 2013-08-05 NOTE — Progress Notes (Signed)
Pt taken to main entrance via wheelchair by staff member.  Stable at time of D/C.

## 2013-08-06 ENCOUNTER — Telehealth: Payer: Self-pay | Admitting: Obstetrics & Gynecology

## 2013-08-06 MED ORDER — SULFAMETHOXAZOLE-TMP DS 800-160 MG PO TABS: 1.0000 | ORAL_TABLET | Freq: Two times a day (BID) | ORAL | Status: DC

## 2013-08-06 NOTE — Telephone Encounter (Signed)
Spoke with pt letting her know Bactrim was sent to pharmacy, and to stop Cipro. Pt voiced understanding. [place]

## 2013-08-06 NOTE — Telephone Encounter (Signed)
Spoke with pt. Pt was put on Cipro. She has asthma and started wheezing last pm. Using her inhaler and that is helping. Pt is having itching, nausea, no appetite, headache, and breathing problems. Pt feels like the Cipro is causing it. She is requesting a different antibiotic. I offered an appt for pt, but she didn't feel like she needed to come in, just needs antibiotic changed. Uses [facility] in [place]. Thanks!!! [employer]

## 2013-08-09 DIAGNOSIS — Z029 Encounter for administrative examinations, unspecified: Secondary | ICD-10-CM

## 2013-08-11 ENCOUNTER — Ambulatory Visit (INDEPENDENT_AMBULATORY_CARE_PROVIDER_SITE_OTHER): Payer: Self-pay | Admitting: Obstetrics & Gynecology

## 2013-08-11 ENCOUNTER — Encounter: Payer: Self-pay | Admitting: Obstetrics & Gynecology

## 2013-08-11 VITALS — BP 144/90 | Ht 64.0 in | Wt 199.0 lb

## 2013-08-11 DIAGNOSIS — Z9889 Other specified postprocedural states: Secondary | ICD-10-CM

## 2013-08-11 DIAGNOSIS — Z90710 Acquired absence of both cervix and uterus: Secondary | ICD-10-CM

## 2013-08-11 NOTE — Progress Notes (Signed)
Patient ID: Bridget Roberts, female   DOB: 11/22/1965, 48 y.o.   MRN: [id number] Blood pressure 144/90, height 5\' 4"  (1.626 m), weight 199 lb (90.266 kg), last menstrual period 01/29/2013.  Post op 1 week from [facility] for porlapse Had some post op nausea and vomiting which has resolved now Urinating ok +BM now appetite has returned  Exam Abdomen benign NEFG Vagina no discharge Vaginal cuff healing well Bimanual no masses non tender  Normal post op visit  Follow up 4 weeks

## 2013-08-14 NOTE — Discharge Summary (Signed)
Physician Discharge Summary  Patient ID: Bridget Roberts MRN: [id number] DOB/AGE: 1965/09/08 48 y.o.  Admit date: 08/04/2013 Discharge date: 08/14/2013  Admission Diagnoses:Grade III prolapse  Discharge Diagnoses:  Active Problems:   S/P vaginal hysterectomy   Discharged Condition: good  Hospital Course: unremarkable  Consults: None  Significant Diagnostic Studies: labs: CBC, BMET  Treatments: procedures: TVH  Discharge Exam: Blood pressure 105/62, pulse 66, temperature 97.8 F (36.6 C), temperature source Oral, resp. rate 18, height 5\' 4"  (1.626 m), weight 205 lb 0.4 oz (93 kg), SpO2 96.00%. General appearance: alert, cooperative and no distress GI: soft, non-tender; bowel sounds normal; no masses,  no organomegaly  Disposition: 01-Home or Self Care  Discharge Instructions   Call MD for:  persistant nausea and vomiting    Complete by:  As directed      Call MD for:  severe uncontrolled pain    Complete by:  As directed      Call MD for:  temperature >100.4    Complete by:  As directed      Diet - low sodium heart healthy    Complete by:  As directed      Driving Restrictions    Complete by:  As directed   1 week     Increase activity slowly    Complete by:  As directed      Lifting restrictions    Complete by:  As directed   No more than 10 pounds     No wound care    Complete by:  As directed      Sexual Activity Restrictions    Complete by:  As directed   Are you kidding?            Medication List    STOP taking these medications       HYDROcodone-acetaminophen 5-500 MG per tablet  Commonly known as:  VICODIN      TAKE these medications       ciprofloxacin 500 MG tablet  Commonly known as:  CIPRO  Take 1 tablet (500 mg total) by mouth 2 (two) times daily.     ibuprofen 800 MG tablet  Commonly known as:  ADVIL,MOTRIN  Take 1 tablet (800 mg total) by mouth every 8 (eight) hours as needed for pain.     oxyCODONE-acetaminophen 5-325 MG per  tablet  Commonly known as:  PERCOCET/ROXICET  Take 1-2 tablets by mouth every 4 (four) hours as needed for severe pain (moderate to severe pain (when tolerating fluids)).     topiramate 25 MG tablet  Commonly known as:  TOPAMAX  Take 1 tablet (25 mg total) by mouth daily.           Follow-up Information   Follow up with Florian Buff, MD On 08/11/2013. (at 2:00 pm)    Specialties:  Obstetrics and Gynecology, Radiology   Contact information:   [place] [address] [phone]       Signed: Florian Buff 08/14/2013, 11:01 AM

## 2013-08-31 ENCOUNTER — Telehealth: Payer: Self-pay | Admitting: *Deleted

## 2013-08-31 NOTE — Telephone Encounter (Signed)
Per Dr. Elonda Husky ok to give pt a letter stating not to return to work until September 29, 2013 due to recent Hysterectomy Vaginal 08/04/2013. Letter faxed per pt request to [place], [place], [place] ATTN: Jennye Moccasin

## 2013-09-08 ENCOUNTER — Ambulatory Visit (INDEPENDENT_AMBULATORY_CARE_PROVIDER_SITE_OTHER): Payer: Self-pay | Admitting: Obstetrics & Gynecology

## 2013-09-08 ENCOUNTER — Encounter: Payer: Self-pay | Admitting: Obstetrics & Gynecology

## 2013-09-08 VITALS — BP 110/80 | Wt 205.0 lb

## 2013-09-08 DIAGNOSIS — Z9889 Other specified postprocedural states: Secondary | ICD-10-CM

## 2013-09-08 DIAGNOSIS — Z90710 Acquired absence of both cervix and uterus: Secondary | ICD-10-CM

## 2013-09-08 NOTE — Progress Notes (Signed)
Patient ID: Bridget Roberts, female   DOB: [birth year], 48 y.o.   MRN: [id number] 4 weeks post op from [facility] No complaints  Doing well Minimal discharge  Exam Cuff intact Sutures still present  Normal post op No sex for 4 weeks Precautions are reviewed  Blood pressure 110/80, weight 205 lb (92.987 kg), last menstrual period 01/29/2013.

## 2013-09-10 ENCOUNTER — Telehealth: Payer: Self-pay | Admitting: *Deleted

## 2013-09-17 NOTE — Telephone Encounter (Signed)
Pt informed we have received the forms for FMLA. Requesting to return to work on 09/27/2013. Pt to pick up Tuesday 09/21/2013 of next week when she has an appt with Dr. Elonda Husky.

## 2013-09-27 ENCOUNTER — Telehealth: Payer: Self-pay | Admitting: Obstetrics & Gynecology

## 2013-09-27 NOTE — Telephone Encounter (Signed)
Note given for pt to return to work 09/27/2013

## 2013-10-20 ENCOUNTER — Encounter: Payer: Self-pay | Admitting: Family Medicine

## 2013-10-20 ENCOUNTER — Ambulatory Visit (INDEPENDENT_AMBULATORY_CARE_PROVIDER_SITE_OTHER): Payer: Managed Care, Other (non HMO) | Admitting: Family Medicine

## 2013-10-20 VITALS — BP 134/78 | HR 68 | Resp 18 | Ht 64.0 in | Wt 209.0 lb

## 2013-10-20 DIAGNOSIS — J45909 Unspecified asthma, uncomplicated: Secondary | ICD-10-CM | POA: Insufficient documentation

## 2013-10-20 DIAGNOSIS — E785 Hyperlipidemia, unspecified: Secondary | ICD-10-CM

## 2013-10-20 DIAGNOSIS — G43119 Migraine with aura, intractable, without status migrainosus: Secondary | ICD-10-CM

## 2013-10-20 DIAGNOSIS — Z1239 Encounter for other screening for malignant neoplasm of breast: Secondary | ICD-10-CM

## 2013-10-20 DIAGNOSIS — F4321 Adjustment disorder with depressed mood: Secondary | ICD-10-CM

## 2013-10-20 DIAGNOSIS — E669 Obesity, unspecified: Secondary | ICD-10-CM

## 2013-10-20 DIAGNOSIS — G43C1 Periodic headache syndromes in child or adult, intractable: Secondary | ICD-10-CM

## 2013-10-20 DIAGNOSIS — R7302 Impaired glucose tolerance (oral): Secondary | ICD-10-CM

## 2013-10-20 DIAGNOSIS — J4520 Mild intermittent asthma, uncomplicated: Secondary | ICD-10-CM

## 2013-10-20 DIAGNOSIS — Z634 Disappearance and death of family member: Secondary | ICD-10-CM

## 2013-10-20 DIAGNOSIS — R7309 Other abnormal glucose: Secondary | ICD-10-CM

## 2013-10-20 DIAGNOSIS — Z23 Encounter for immunization: Secondary | ICD-10-CM

## 2013-10-20 DIAGNOSIS — G43819 Other migraine, intractable, without status migrainosus: Secondary | ICD-10-CM

## 2013-10-20 MED ORDER — ALBUTEROL SULFATE HFA 108 (90 BASE) MCG/ACT IN AERS: 2.0000 | INHALATION_SPRAY | Freq: Four times a day (QID) | RESPIRATORY_TRACT | Status: DC | PRN

## 2013-10-20 MED ORDER — SUMATRIPTAN SUCCINATE 100 MG PO TABS: ORAL_TABLET | ORAL | Status: DC

## 2013-10-20 NOTE — Progress Notes (Signed)
   Subjective:    Patient ID: Bridget Roberts, female    DOB: October 15, 1965, 48 y.o.   MRN: [id number]  HPI 2 migraines in the past 3 months, associated with the headaches she has had right ear pressure , otherwise denies ear symptoms, nasal or sinus pressure, sore throat or productive cough.  Asthma flare up in past 3 month, none since then, but requests an inhaler for as needed use  Needs flu vaccine and to get re started on healthy lifestyle to improve health and promote weight loss.   Review of Systems See HPI Denies recent fever or chills. Denies sinus pressure, nasal congestion, ear pain or sore throat. Denies chest congestion, productive cough has had single episode of  wheezing. Denies chest pains, palpitations and leg swelling Denies abdominal pain, nausea, vomiting,diarrhea or constipation.   Denies dysuria, frequency, hesitancy or incontinence. Denies joint pain, swelling and limitation in mobility. Denies  seizures, numbness, or tingling. Denies uncontrolled depression, anxiety or insomnia.Still grieving the unexpected loss of her son earlier this year Denies skin break down or rash.        Objective:   Physical Exam BP 134/78  Pulse 68  Resp 18  Ht 5\' 4"  (1.626 m)  Wt 209 lb 0.6 oz (94.82 kg)  BMI 35.86 kg/m2  SpO2 99%  LMP 01/29/2013 Patient alert and oriented and in no cardiopulmonary distress.  HEENT: No facial asymmetry, EOMI,   oropharynx pink and moist.  Neck supple no JVD, no mass.  Chest: Clear to auscultation bilaterally.  CVS: S1, S2 no murmurs, no S3.Regular rate.  ABD: Soft non tender.   Ext: No edema  MS: Adequate ROM spine, shoulders, hips and knees.  Skin: Intact, no ulcerations or rash noted.  Psych: Good eye contact, normal affect. Memory intact not anxious or depressed appearing.  CNS: CN 2-12 intact, power,  normal throughout.no focal deficits noted.        Assessment & Plan:  Intrinsic asthma Infrequent flares but had  episode in the last month, proventil only , as needed  Migraine headache Uncontrolled, pt needs immitrex for use for headache when occurs, has had success in the past with this  IGT (impaired glucose tolerance) Patient educated about the importance of limiting  Carbohydrate intake , the need to commit to daily physical activity for a minimum of 30 minutes , and to commit weight loss. The fact that changes in all these areas will reduce or eliminate all together the development of diabetes is stressed.   Updated lab needed at/ before next visit.   OBESITY, UNSPECIFIED Deteriorated. Patient re-educated about  the importance of commitment to a  minimum of 150 minutes of exercise per week. The importance of healthy food choices with portion control discussed. Encouraged to start a food diary, count calories and to consider  joining a support group. Sample diet sheets offered. Goals set by the patient for the next several months.     Grief at loss of child Lost her only son in [facility] in Spring 2015, states doing better than everyone expects, but is willing to keep the door open to therapy  Need for prophylactic vaccination and inoculation against influenza Vaccine administered at visit.

## 2013-10-20 NOTE — Patient Instructions (Addendum)
F/u in 3 months, call if you need me before  Proventil sent in  For asthma when needed  Immitrex sent for migraines  Labs in am  fasting HBa!C ,lipid,, chem 7, TSH  Flu vaccine today  We will sched mammogram for you on a friday  It is important that you exercise regularly at least 30 minutes 5 times a week. If you develop chest pain, have severe difficulty breathing, or feel very tired, stop exercising immediately and seek medical attention

## 2013-10-21 LAB — BASIC METABOLIC PANEL
BUN: 9 mg/dL (ref 6–23)
CHLORIDE: 104 meq/L (ref 96–112)
CO2: 28 meq/L (ref 19–32)
CREATININE: 0.82 mg/dL (ref 0.50–1.10)
Calcium: 9.4 mg/dL (ref 8.4–10.5)
Glucose, Bld: 80 mg/dL (ref 70–99)
Potassium: 4.5 mEq/L (ref 3.5–5.3)
SODIUM: 137 meq/L (ref 135–145)

## 2013-10-21 LAB — LIPID PANEL
CHOLESTEROL: 183 mg/dL (ref 0–200)
HDL: 65 mg/dL (ref >39)
LDL Cholesterol: 104 mg/dL — ABNORMAL HIGH (ref 0–99)
Total CHOL/HDL Ratio: 2.8 Ratio
Triglycerides: 71 mg/dL (ref <150)
VLDL: 14 mg/dL (ref 0–40)

## 2013-10-21 LAB — HEMOGLOBIN A1C
Hgb A1c MFr Bld: 5.6 % (ref <5.7)
MEAN PLASMA GLUCOSE: 114 mg/dL (ref <117)

## 2013-10-21 LAB — TSH: TSH: 2.653 u[IU]/mL (ref 0.350–4.500)

## 2013-10-21 NOTE — Assessment & Plan Note (Signed)
Infrequent flares but had episode in the last month, proventil only , as needed

## 2013-10-21 NOTE — Assessment & Plan Note (Signed)
Uncontrolled, pt needs immitrex for use for headache when occurs, has had success in the past with this

## 2013-10-21 NOTE — Assessment & Plan Note (Signed)
Patient educated about the importance of limiting  Carbohydrate intake , the need to commit to daily physical activity for a minimum of 30 minutes , and to commit weight loss. The fact that changes in all these areas will reduce or eliminate all together the development of diabetes is stressed.   Updated lab needed at/ before next visit.  

## 2013-10-24 DIAGNOSIS — Z634 Disappearance and death of family member: Secondary | ICD-10-CM

## 2013-10-24 DIAGNOSIS — Z23 Encounter for immunization: Secondary | ICD-10-CM | POA: Insufficient documentation

## 2013-10-24 DIAGNOSIS — F4321 Adjustment disorder with depressed mood: Secondary | ICD-10-CM | POA: Insufficient documentation

## 2013-10-24 NOTE — Assessment & Plan Note (Signed)
Vaccine administered at visit.  

## 2013-10-24 NOTE — Assessment & Plan Note (Addendum)
Lost her only son in [facility] in Spring 2015, states doing better than everyone expects, but is willing to keep the door open to therapy

## 2013-10-24 NOTE — Assessment & Plan Note (Addendum)
Deteriorated. Patient re-educated about  the importance of commitment to a  minimum of 150 minutes of exercise per week. The importance of healthy food choices with portion control discussed. Encouraged to start a food diary, count calories and to consider  joining a support group. Sample diet sheets offered. Goals set by the patient for the next several months.    

## 2013-10-27 ENCOUNTER — Ambulatory Visit (HOSPITAL_COMMUNITY): Payer: Managed Care, Other (non HMO)

## 2013-10-29 ENCOUNTER — Ambulatory Visit (HOSPITAL_COMMUNITY)
Admission: RE | Admit: 2013-10-29 | Discharge: 2013-10-29 | Disposition: A | Discharge status: Home / Self Care | Payer: Managed Care, Other (non HMO) | Source: Ambulatory Visit | Attending: Family Medicine | Admitting: Family Medicine

## 2013-10-29 DIAGNOSIS — Z1231 Encounter for screening mammogram for malignant neoplasm of breast: Secondary | ICD-10-CM | POA: Diagnosis present

## 2013-10-29 DIAGNOSIS — Z1239 Encounter for other screening for malignant neoplasm of breast: Secondary | ICD-10-CM

## 2014-01-19 ENCOUNTER — Ambulatory Visit: Payer: Managed Care, Other (non HMO) | Admitting: Family Medicine

## 2014-01-19 ENCOUNTER — Encounter: Payer: Self-pay | Admitting: Family Medicine

## 2014-11-05 ENCOUNTER — Encounter (HOSPITAL_COMMUNITY): Payer: Self-pay | Admitting: *Deleted

## 2014-11-05 ENCOUNTER — Emergency Department (HOSPITAL_COMMUNITY): Payer: Managed Care, Other (non HMO)

## 2014-11-05 ENCOUNTER — Emergency Department (HOSPITAL_COMMUNITY)
Admission: EM | Admit: 2014-11-05 | Discharge: 2014-11-05 | Disposition: A | Discharge status: Home / Self Care | Payer: Managed Care, Other (non HMO) | Attending: Emergency Medicine | Admitting: Emergency Medicine

## 2014-11-05 DIAGNOSIS — M542 Cervicalgia: Secondary | ICD-10-CM | POA: Diagnosis present

## 2014-11-05 DIAGNOSIS — Z79899 Other long term (current) drug therapy: Secondary | ICD-10-CM | POA: Insufficient documentation

## 2014-11-05 DIAGNOSIS — G43909 Migraine, unspecified, not intractable, without status migrainosus: Secondary | ICD-10-CM | POA: Diagnosis not present

## 2014-11-05 DIAGNOSIS — Z880 Allergy status to penicillin: Secondary | ICD-10-CM | POA: Insufficient documentation

## 2014-11-05 DIAGNOSIS — Z8719 Personal history of other diseases of the digestive system: Secondary | ICD-10-CM | POA: Diagnosis not present

## 2014-11-05 DIAGNOSIS — R590 Localized enlarged lymph nodes: Secondary | ICD-10-CM | POA: Diagnosis not present

## 2014-11-05 DIAGNOSIS — R591 Generalized enlarged lymph nodes: Secondary | ICD-10-CM

## 2014-11-05 MED ORDER — HYDROCODONE-ACETAMINOPHEN 5-325 MG PO TABS: 2.0000 | ORAL_TABLET | ORAL | Status: DC | PRN

## 2014-11-05 MED ORDER — IOHEXOL 300 MG/ML  SOLN
75.0000 mL | Freq: Once | INTRAMUSCULAR | Status: AC | PRN
Start: 2014-11-05 — End: 2014-11-05
  Administered 2014-11-05: 75 mL via INTRAVENOUS

## 2014-11-05 MED ORDER — CLINDAMYCIN HCL 300 MG PO CAPS: 300.0000 mg | ORAL_CAPSULE | Freq: Four times a day (QID) | ORAL | Status: DC

## 2014-11-05 NOTE — Discharge Instructions (Signed)

## 2014-11-05 NOTE — ED Provider Notes (Signed)
CSN: [id number]     Arrival date & time 11/05/14  1107 History   First MD Initiated Contact with Patient 11/05/14 1147     Chief Complaint  Patient presents with  . Pain     (Consider location/radiation/quality/duration/timing/severity/associated sxs/prior Treatment) Patient is a 49 y.o. female presenting with neck injury. The history is provided by the patient. No language interpreter was used.  Neck Injury This is a new problem. The current episode started yesterday. The problem occurs constantly. The problem has been gradually worsening. Pertinent negatives include no chest pain, sore throat or swollen glands. Nothing aggravates the symptoms. She has tried nothing for the symptoms. The treatment provided moderate relief.  Pt had a tooth extracted last week.  Pt had packing placed yesterday.   Pt reports increased swelling today. No fever, no cough  Past Medical History  Diagnosis Date  . GERD (gastroesophageal reflux disease)   . Back pain   . PONV (postoperative nausea and vomiting)   . Headache(784.0)   . Migraines    Past Surgical History  Procedure Laterality Date  . Salpingectomy  right    unilateral  . Vocal chord polyp    . Polypectomy      vocal chords  . Endometrial ablation    . Ectopic pregnancy surgery    . Vaginal hysterectomy N/A 08/04/2013    Procedure: HYSTERECTOMY VAGINAL;  Surgeon: Florian Buff, MD;  Location: [facility];  Service: Gynecology;  Laterality: N/A;   Family History  Problem Relation Age of Onset  . Prostate cancer Father   . Heart attack Mother    Social History  Substance Use Topics  . Smoking status: Never Smoker   . Smokeless tobacco: None  . Alcohol Use: No   OB History    No data available     Review of Systems  HENT: Negative for sore throat.   Cardiovascular: Negative for chest pain.  All other systems reviewed and are negative.     Allergies  Penicillins  Home Medications   Prior to Admission medications    Medication Sig Start Date End Date Taking? Authorizing Provider  ibuprofen (ADVIL,MOTRIN) 600 MG tablet Take 600 mg by mouth every 6 (six) hours as needed for mild pain.   Yes Historical Provider, MD  albuterol (PROVENTIL HFA;VENTOLIN HFA) 108 (90 BASE) MCG/ACT inhaler Inhale 2 puffs into the lungs every 6 (six) hours as needed for wheezing or shortness of breath. 10/20/13   Fayrene Helper, MD  SUMAtriptan (IMITREX) 100 MG tablet May repeat in 2 hours if headache persists or recurs.  Maximum of two tablets in 24 hours. Maximum use is twice weekly 10/20/13   Fayrene Helper, MD   BP 150/89 mmHg  Pulse 70  Temp(Src) 98.3 F (36.8 C) (Oral)  Resp 16  Ht 5\' 4"  (1.626 m)  Wt 204 lb (92.534 kg)  BMI 35.00 kg/m2  SpO2 100%  LMP 01/29/2013 Physical Exam  Constitutional: She is oriented to person, place, and time. She appears well-developed and well-nourished.  HENT:  Head: Normocephalic.  Eyes: EOM are normal.  Neck: Neck supple.  Tender swollen area left clavicle,  Tender to palpations   Pulmonary/Chest: Effort normal.  Abdominal: She exhibits no distension.  Musculoskeletal: Normal range of motion.  Neurological: She is alert and oriented to person, place, and time.  Psychiatric: She has a normal mood and affect.  Nursing note and vitals reviewed.   ED Course  Procedures (including critical care time) Labs Review  Labs Reviewed - No data to display  Imaging Review Dg Chest 2 View  11/05/2014   CLINICAL DATA:  Left neck/ supraclavicular swelling, recent tooth extraction  EXAM: CHEST  2 VIEW  COMPARISON:  04/11/2006  FINDINGS: Lungs are clear.  No pleural effusion or pneumothorax.  The heart is normal in size.  Visualized osseous structures are within normal limits.  IMPRESSION: Normal chest radiographs.   Electronically Signed   By: Julian Hy M.D.   On: 11/05/2014 12:25   Ct Soft Tissue Neck W Contrast  11/05/2014   CLINICAL DATA:  49 year old female with left  clavicular swelling and tenderness. Dental work 9 days ago (fixed a broken crown). Recent left facial swelling and tenderness, with some associated left-sided neck swelling this morning.  EXAM: CT NECK WITH CONTRAST  TECHNIQUE: Multidetector CT imaging of the neck was performed using the standard protocol following the bolus administration of intravenous contrast.  CONTRAST:  27mL OMNIPAQUE IOHEXOL 300 MG/ML  SOLN  COMPARISON:  No priors.  FINDINGS: Pharynx and larynx: No mass, mucosal thickening or other focal abnormality.  Salivary glands: Normal in appearance.  Thyroid: Normal in appearance.  Lymph nodes: No cervical lymphadenopathy. Some prominent but nonenlarged left supraclavicular lymph nodes are noted measuring up to 6 mm in short axis (highly nonspecific).  Vascular: No significant atherosclerotic disease, aneurysm or dissection identified in the visualize arterial vasculature.  Limited intracranial: Unremarkable.  Visualized orbits: Normal.  Mastoids and visualized paranasal sinuses: Visualized mastoids and paranasal sinuses are well pneumatized.  Skeleton: There are no aggressive appearing lytic or blastic lesions noted in the visualized portions of the skeleton. Mild reversal of normal cervical lordosis, likely positional. Tooth 19 appears to of been recently extracted, as there is some gas in the vacated tooth socket.  Upper chest: Soft tissue stranding in the subcutaneous fat at the left supraclavicular region and lower left cervical region. No well-defined fluid collection to suggest abscess.  IMPRESSION: 1. Nonspecific soft tissue stranding in the lower left cervical region and left supraclavicular region with some prominent but nonenlarged lymph nodes throughout this area. These findings are nonspecific. No associated skin thickening by CT imaging to strongly suggest cellulitis. 2. Additional incidental findings, as above.   Electronically Signed   By: Vinnie Langton M.D.   On: 11/05/2014 13:46    I have personally reviewed and evaluated these images and lab results as part of my medical decision-making.   EKG Interpretation None      MDM  Pt advised to see Dr. Moshe Cipro for recheck in 1 week.   Rx for clindamycin Follow up with dentist as scheduled   Final diagnoses:  Lymphadenopathy    Meds ordered this encounter  Medications  . ibuprofen (ADVIL,MOTRIN) 600 MG tablet    Sig: Take 600 mg by mouth every 6 (six) hours as needed for mild pain.  . iohexol (OMNIPAQUE) 300 MG/ML solution 75 mL    Sig:   . clindamycin (CLEOCIN) 300 MG capsule    Sig: Take 1 capsule (300 mg total) by mouth every 6 (six) hours.    Dispense:  28 capsule    Refill:  0    Order Specific Question:  Supervising Provider    Answer:  MILLER, BRIAN [[id number]]  . HYDROcodone-acetaminophen (NORCO/VICODIN) 5-325 MG per tablet    Sig: Take 2 tablets by mouth every 4 (four) hours as needed.    Dispense:  10 tablet    Refill:  0    Order Specific Question:  Supervising Provider    Answer:  Noemi Chapel [[id number]]      Hollace Kinnier Ringgold, PA-C 11/05/14 [place], MD 11/05/14 [phone]

## 2014-11-05 NOTE — ED Provider Notes (Signed)
Patient seen/examined in the Emergency Department in conjunction with Midlevel Provider  Patient reports pain/swelling over left clavicle Exam : awake/alert, tenderness/edema to left clavicular region Plan: will start with CXR and reassess   Ripley Fraise, MD 11/05/14 1227

## 2014-11-05 NOTE — ED Notes (Signed)
Pt states pain over left clavicle. Pt states she had a tooth pulled last week and pain began to the area then. States she noticed swelling and pain to the area after waking. NAD.

## 2015-02-09 ENCOUNTER — Encounter: Payer: Managed Care, Other (non HMO) | Admitting: Family Medicine

## 2015-02-09 ENCOUNTER — Encounter: Payer: Self-pay | Admitting: Family Medicine

## 2015-07-17 ENCOUNTER — Other Ambulatory Visit (HOSPITAL_COMMUNITY): Payer: Self-pay | Admitting: Preventative Medicine

## 2015-07-17 DIAGNOSIS — R937 Abnormal findings on diagnostic imaging of other parts of musculoskeletal system: Secondary | ICD-10-CM

## 2015-07-19 ENCOUNTER — Encounter (HOSPITAL_COMMUNITY): Payer: Self-pay

## 2015-07-19 ENCOUNTER — Encounter (HOSPITAL_COMMUNITY)
Admission: RE | Admit: 2015-07-19 | Discharge: 2015-07-19 | Disposition: A | Discharge status: Home / Self Care | Payer: Worker's Compensation | Source: Ambulatory Visit | Attending: Preventative Medicine | Admitting: Preventative Medicine

## 2015-07-19 DIAGNOSIS — R937 Abnormal findings on diagnostic imaging of other parts of musculoskeletal system: Secondary | ICD-10-CM | POA: Diagnosis present

## 2015-07-19 HISTORY — DX: Unspecified asthma, uncomplicated: J45.909

## 2015-07-19 MED ORDER — TECHNETIUM TC 99M MEDRONATE IV KIT
25.0000 | PACK | Freq: Once | INTRAVENOUS | Status: AC | PRN
  Administered 2015-07-19: 22.8 via INTRAVENOUS

## 2015-09-13 ENCOUNTER — Telehealth (HOSPITAL_COMMUNITY): Payer: Self-pay | Admitting: Physical Therapy

## 2015-09-13 ENCOUNTER — Ambulatory Visit (HOSPITAL_COMMUNITY): Payer: Worker's Compensation | Attending: Orthopaedic Surgery | Admitting: Physical Therapy

## 2015-09-13 DIAGNOSIS — R262 Difficulty in walking, not elsewhere classified: Secondary | ICD-10-CM | POA: Diagnosis present

## 2015-09-13 DIAGNOSIS — M25572 Pain in left ankle and joints of left foot: Secondary | ICD-10-CM

## 2015-09-13 DIAGNOSIS — R600 Localized edema: Secondary | ICD-10-CM

## 2015-09-13 DIAGNOSIS — M6281 Muscle weakness (generalized): Secondary | ICD-10-CM

## 2015-09-13 NOTE — Telephone Encounter (Signed)
L/M with HR and [employer] requested notification of how many visit have been approved, office was closed.NF 09/13/15

## 2015-09-13 NOTE — Patient Instructions (Signed)
    STRETCH WITH SHEET  Start in a long sit position with a belt or sheet looped around your foot.  Pull on the belt or sheet to stretch your calf and hamstring muscles. You should feel a stretch at the back of your knee and in your calf.  Hold for 30 seconds and repeat 3 times, twice a day.    ANKLE CIRCLES  Move your ankle in a circular pattern one direction for several repetitions and then reverse the direction.  Repeat 15 times clockwise, then 15 times counter clockwise, twice a day.    ANKLE ABC's   While in a seated position, write out the alphabet in the air with your big toe.  Your ankle should be moving as you perform this.  Repeat 1-2 times, twice a day.

## 2015-09-13 NOTE — Therapy (Signed)
[facility] [place], [place], [address] Phone: [phone]   Fax:  [phone]  Physical Therapy Evaluation  Patient Details  Name: Bridget Roberts MRN: [id number] Date of Birth: Dec 07, 1965 Referring Provider: Leandrew Koyanagi   Encounter Date: 09/13/2015      PT End of Session - 09/13/15 1731    Visit Number 1   Number of Visits 12   Date for PT Re-Evaluation 10/04/15   Authorization Type Worker's Comp (left message asking about visit limits date of eval)   Authorization Time Period 09/13/15 to 10/25/15   PT Start Time 1648   PT Stop Time 1728   PT Time Calculation (min) 40 min   Activity Tolerance Patient tolerated treatment well   Behavior During Therapy [facility] for tasks assessed/performed      Past Medical History:  Diagnosis Date  . Asthma   . Back pain   . GERD (gastroesophageal reflux disease)   . Headache(784.0)   . Migraines   . PONV (postoperative nausea and vomiting)     Past Surgical History:  Procedure Laterality Date  . ECTOPIC PREGNANCY SURGERY    . ENDOMETRIAL ABLATION    . POLYPECTOMY     vocal chords  . SALPINGECTOMY  right   unilateral  . VAGINAL HYSTERECTOMY N/A 08/04/2013   Procedure: HYSTERECTOMY VAGINAL;  Surgeon: Florian Buff, MD;  Location: [facility];  Service: Gynecology;  Laterality: N/A;  . vocal chord polyp      There were no vitals filed for this visit.       Subjective Assessment - 09/13/15 1655    Subjective Patient arrives stating that she has 2 fractures in her foot after dropping a 7-8 pound item from overhead on her foot; she arrives in a boot and states that she is not aware of any weight bearing precautions other than at her most recent MD appointment she was told to keep the boot on for weight bearing. The fractures occurred around the 2nd or 3rd week of May. No falls or close calls recently. Her next MD appointment is on Friday the 4th. She is not doing any ankle exercises out  of the boot yet; she is having back pain. Sometimes she has aching in her calf and thigh muscles but she is wondering if this is from where she had been walking on her tip toes.    Pertinent History asthma (inhaler PRN), LBP    Limitations Standing;Walking   How long can you stand comfortably? limited but unable to identify specific timeframe    How long can you walk comfortably? limited but unable to identify specific timeframe    Patient Stated Goals get back to PLOF, control pain and edema    Currently in Pain? Yes   Pain Score 3    Pain Location Foot            [facility] PT Assessment - 09/13/15 0001      Assessment   Medical Diagnosis L foot contusion    Referring Provider Naiping M Xu    Onset Date/Surgical Date --  couple of months ago    Next MD Visit August 4th      Balance Screen   Has the patient fallen in the past 6 months No   Has the patient had a decrease in activity level because of a fear of falling?  No   Is the patient reluctant to leave their home because of a fear of  falling?  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, very physical job    Leisure none      Observation/Other Assessments   Observations general stiffness noted L ankle/foot with PROM of mechanics   mild edema noted general L ankle/foot/distal calf    Focus on Therapeutic Outcomes (FOTO)  57% limited      AROM   Right Ankle Dorsiflexion 13   Right Ankle Plantar Flexion --  wfl    Right Ankle Inversion 30   Right Ankle Eversion 20   Left Ankle Dorsiflexion 8   Left Ankle Plantar Flexion --  WFL    Left Ankle Inversion 18   Left Ankle Eversion 15     Strength   Right Knee Flexion 4+/5   Right Knee Extension 5/5   Left Knee Flexion 4+/5   Left Knee Extension 5/5   Right Ankle Dorsiflexion 5/5   Right Ankle  Inversion 5/5   Right Ankle Eversion 5/5   Left Ankle Dorsiflexion 5/5   Left Ankle Inversion 4/5   Left Ankle Eversion 4+/5     Ambulation/Gait   Gait Comments proximal muscle weakness, LLD due to boot                            PT Education - 09/13/15 1731    Education provided Yes   Education Details prognosis, POC, HEP; please speak to MD about boot/weight bearing status at appt friday    Person(s) Educated Patient   Methods Explanation;Handout;Demonstration   Comprehension Verbalized understanding;Verbal cues required;Need further instruction          PT Short Term Goals - 09/13/15 1736      PT SHORT TERM GOAL #1   Title Patient to demonstrate L ankle ROM equal to that of her R in order to reduce pain and improve gait/balance    Time 3   Period Weeks   Status New     PT SHORT TERM GOAL #2   Title Patient to be independent with application of ice and edema management strategies in order to reduce pain and edema    Time 3   Period Weeks   Status New     PT SHORT TERM GOAL #3   Title Patient to be ambulating with correct heel-toe mechanics, equal step/stance times, minimal unsteadiness, and no exacerbation in pain in order to improve function and QOL    Time 3   Period Weeks   Status New     PT SHORT TERM GOAL #4   Title Patient to be independent in correctly and consistently performing appropriate HEP, to be updated PRN    Time 3   Period Weeks   Status New           PT Long Term Goals - 09/13/15 1737      PT LONG TERM GOAL #1   Title Patient to demonstrate strength 5/5 in all tested groups in order to reduce pain and improve gait/balance    Time 6   Period Weeks   Status New     PT LONG TERM GOAL #2   Title Patient to experience pain no more than 1/10 in L ankle/foot with weight bearing activities of unlimited duration in order to facilitate return to work    Time 6  Period Weeks   Status New     PT LONG TERM GOAL #3   Title  Patient to be able to maintain SLS for 30 seconds each LE in order to demonstrate good ankle stability and balance    Time 6   Period Weeks   Status New     PT LONG TERM GOAL #4   Title Patient to be participatory in regular exercise program, at least 3 days per week, at least 30 minutes in duration, in order to maintain functional gains and improve health status    Time 6   Period Weeks   Status New               Plan - 09/13/15 1732    Clinical Impression Statement Patient arrives after experiencing fractures in L foot after dropping a heavy item on it at work; patient reports per her most recent MD appointment she is to remain in the boot for weight bearing for now, but she will check on this again on Friday at her appointment. She is currently on light duty at work. Upon examination, patient reveals L ankle/foot stiffness, localized edema, gait impairment, and functional strength impairment; waived tests such as SLS and will perform when patient comes out of her boot with weight bearing per MD recommendation. At this time patient will benefit from skilled PT services to address functional limitations and assist in reaching optimal level of function.    Rehab Potential Good   PT Frequency 2x / week   PT Duration 6 weeks   PT Treatment/Interventions ADLs/Self Care Home Management;Cryotherapy;Gait training;Stair training;Functional mobility training;Therapeutic activities;Therapeutic exercise;Balance training;Neuromuscular re-education;Patient/family education;Manual techniques;Passive range of motion;Energy conservation;Taping   PT Next Visit Plan review HEP and goals; focus on ankle ROM/edema reduction; progress to strength as ROM improves, CKC as tolerated. Check in on when patient can come out of boot.    PT Home Exercise Plan given    Consulted and Agree with Plan of Care Patient      Patient will benefit from skilled therapeutic intervention in order to improve the following  deficits and impairments:  Abnormal gait, Pain, Improper body mechanics, Decreased mobility, Postural dysfunction, Decreased range of motion, Decreased strength, Hypomobility, Decreased balance, Difficulty walking, Increased edema, Impaired flexibility  Visit Diagnosis: Pain in left ankle and joints of left foot - Plan: PT plan of care cert/re-cert  Localized edema - Plan: PT plan of care cert/re-cert  Muscle weakness (generalized) - Plan: PT plan of care cert/re-cert  Difficulty in walking, not elsewhere classified - Plan: PT plan of care cert/re-cert     Problem List Patient Active Problem List   Diagnosis Date Noted  . Grief at loss of child 10/24/2013  . Need for prophylactic vaccination and inoculation against influenza 10/24/2013  . 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 S/P vaginal hysterectomy 08/04/2013  . Third degree uterine prolapse 07/20/2013  . Migraine headache 09/04/2012  . IGT (impaired glucose tolerance) 05/11/2012  . Other and unspecified hyperlipidemia 05/11/2012  . Abnormal vaginal bleeding 04/18/2011  . Vitamin D deficiency 04/11/2011  . OBESITY, UNSPECIFIED 06/01/2007  . BACK PAIN 06/01/2007    [name] PT, DPT [facility] [address] [place], [place], [address] Phone: [phone]   Fax:  [phone]  Name: Bridget Roberts MRN: [id number] Date of Birth: 10-15-1965

## 2015-09-15 ENCOUNTER — Ambulatory Visit (HOSPITAL_COMMUNITY): Payer: 59 | Attending: Family Medicine

## 2015-09-15 DIAGNOSIS — R262 Difficulty in walking, not elsewhere classified: Secondary | ICD-10-CM | POA: Insufficient documentation

## 2015-09-15 DIAGNOSIS — M25572 Pain in left ankle and joints of left foot: Secondary | ICD-10-CM | POA: Diagnosis present

## 2015-09-15 DIAGNOSIS — R600 Localized edema: Secondary | ICD-10-CM | POA: Diagnosis present

## 2015-09-15 DIAGNOSIS — M6281 Muscle weakness (generalized): Secondary | ICD-10-CM | POA: Diagnosis present

## 2015-09-15 NOTE — Therapy (Signed)
[place], [place], [address] Phone: [phone]   Fax:  [phone]  Physical Therapy Treatment  Patient Details  Name: Bridget Roberts MRN: [id number] Date of Birth: 03-04-1965 Referring Provider: Leandrew Koyanagi   Encounter Date: 09/15/2015      PT End of Session - 09/15/15 1659    Visit Number 2   Number of Visits 12   Date for PT Re-Evaluation 10/04/15   Authorization Type Worker's Comp (left message asking about visit limits date of eval)   Authorization Time Period 09/13/15 to 10/25/15   PT Start Time 1648   PT Stop Time 1728   PT Time Calculation (min) 40 min   Activity Tolerance Patient tolerated treatment well   Behavior During Therapy [facility] for tasks assessed/performed      Past Medical History:  Diagnosis Date  . Asthma   . Back pain   . GERD (gastroesophageal reflux disease)   . Headache(784.0)   . Migraines   . PONV (postoperative nausea and vomiting)     Past Surgical History:  Procedure Laterality Date  . ECTOPIC PREGNANCY SURGERY    . ENDOMETRIAL ABLATION    . POLYPECTOMY     vocal chords  . SALPINGECTOMY  right   unilateral  . VAGINAL HYSTERECTOMY N/A 08/04/2013   Procedure: HYSTERECTOMY VAGINAL;  Surgeon: Florian Buff, MD;  Location: [facility];  Service: Gynecology;  Laterality: N/A;  . vocal chord polyp      There were no vitals filed for this visit.      Subjective Assessment - 09/15/15 1647    Subjective Pt went to MD apt earlier today, pt informed to continue wearing boot and going to set up further testing for DVT due to the edema.  Current pain scale 3/10 with achey LE and sharp pain down to toes.     Pertinent History asthma (inhaler PRN), LBP    Patient Stated Goals get back to PLOF, control pain and edema    Currently in Pain? Yes   Pain Score 3    Pain Location Foot   Pain Orientation Left   Pain Descriptors / Indicators Aching;Sharp   Pain Type Acute pain   Pain Onset  More than a month ago   Pain Frequency Constant  constant achey, sharp pain heel with WB   Aggravating Factors  depends on how she steps, with weight bearing   Pain Relieving Factors elevation, heat   Effect of Pain on Daily Activities light duty with work, decreased WB with ADLs                         [facility] Adult PT Treatment/Exercise - 09/15/15 0001      Exercises   Exercises Ankle     Ankle Exercises: Stretches   Gastroc Stretch 3 reps;30 seconds   Gastroc Stretch Limitations long sit stretch with sheet     Ankle Exercises: Seated   ABC's 1 rep   Ankle Circles/Pumps 10 reps;Left  2 sets both directions   Towel Inversion/Eversion 3 reps   Heel Raises 10 reps   Toe Raise 10 reps   BAPS Sitting;Level 2;10 reps   BAPS Limitations all directions                PT Education - 09/15/15 1728    Education provided Yes   Education Details Reviewed goals, assured correct technique with HEP; copy of eval given  to pt. Spoke with WB and to continue with boot until otherwise authorized; reviewed ice and elevation for pain and edema control.   Person(s) Educated Patient   Methods Explanation;Demonstration;Handout;Verbal cues   Comprehension Verbalized understanding;Returned demonstration;Need further instruction          PT Short Term Goals - 09/13/15 1736      PT SHORT TERM GOAL #1   Title Patient to demonstrate L ankle ROM equal to that of her R in order to reduce pain and improve gait/balance    Time 3   Period Weeks   Status New     PT SHORT TERM GOAL #2   Title Patient to be independent with application of ice and edema management strategies in order to reduce pain and edema    Time 3   Period Weeks   Status New     PT SHORT TERM GOAL #3   Title Patient to be ambulating with correct heel-toe mechanics, equal step/stance times, minimal unsteadiness, and no exacerbation in pain in order to improve function and QOL    Time 3   Period Weeks    Status New     PT SHORT TERM GOAL #4   Title Patient to be independent in correctly and consistently performing appropriate HEP, to be updated PRN    Time 3   Period Weeks   Status New           PT Long Term Goals - 09/13/15 1737      PT LONG TERM GOAL #1   Title Patient to demonstrate strength 5/5 in all tested groups in order to reduce pain and improve gait/balance    Time 6   Period Weeks   Status New     PT LONG TERM GOAL #2   Title Patient to experience pain no more than 1/10 in L ankle/foot with weight bearing activities of unlimited duration in order to facilitate return to work    Time 6   Period Weeks   Status New     PT LONG TERM GOAL #3   Title Patient to be able to maintain SLS for 30 seconds each LE in order to demonstrate good ankle stability and balance    Time 6   Period Weeks   Status New     PT LONG TERM GOAL #4   Title Patient to be participatory in regular exercise program, at least 3 days per week, at least 30 minutes in duration, in order to maintain functional gains and improve health status    Time 6   Period Weeks   Status New               Plan - 09/15/15 1703    Clinical Impression Statement Reviewed goals, compliance and assured correct form and technqiue with HEP and copy of evaluation given to pt.  Pt stated at MD apt earlier today to continue wearing boot, have plans for further testing to rule out DVT.  Session focus on improving ankle mobility with therex exercises and stretches and education with edema strategies to assist with pain and swelling.  No manual complete this session as futher testing is planned for possible DVT.   Rehab Potential Good   PT Frequency 2x / week   PT Duration 6 weeks   PT Treatment/Interventions ADLs/Self Care Home Management;Cryotherapy;Gait training;Stair training;Functional mobility training;Therapeutic activities;Therapeutic exercise;Balance training;Neuromuscular re-education;Patient/family  education;Manual techniques;Passive range of motion;Energy conservation;Taping   PT Next Visit Plan focus on ankle ROM/edema reduction;  progress to strength as ROM improves, CKC as tolerated. Check in on when patient has plans for DVT testing and when can come out of boot per MD.      Patient will benefit from skilled therapeutic intervention in order to improve the following deficits and impairments:  Abnormal gait, Pain, Improper body mechanics, Decreased mobility, Postural dysfunction, Decreased range of motion, Decreased strength, Hypomobility, Decreased balance, Difficulty walking, Increased edema, Impaired flexibility  Visit Diagnosis: Pain in left ankle and joints of left foot  Localized edema  Muscle weakness (generalized)  Difficulty in walking, not elsewhere classified     Problem List Patient Active Problem List   Diagnosis Date Noted  . Grief at loss of child 10/24/2013  . Need for prophylactic vaccination and inoculation against influenza 10/24/2013  . 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 S/P vaginal hysterectomy 08/04/2013  . Third degree uterine prolapse 07/20/2013  . Migraine headache 09/04/2012  . IGT (impaired glucose tolerance) 05/11/2012  . Other and unspecified hyperlipidemia 05/11/2012  . Abnormal vaginal bleeding 04/18/2011  . Vitamin D deficiency 04/11/2011  . OBESITY, UNSPECIFIED 06/01/2007  . BACK PAIN 06/01/2007   Ihor Austin, [place]; [place]  Aldona Lento 09/15/2015, 5:30 PM  [place], [place], [address] Phone: [phone]   Fax:  [phone]  Name: SINDIA GAMBLES MRN: [id number] Date of Birth: [birth year]

## 2015-09-18 ENCOUNTER — Other Ambulatory Visit: Payer: Self-pay

## 2015-09-18 ENCOUNTER — Other Ambulatory Visit: Payer: Self-pay | Admitting: Orthopaedic Surgery

## 2015-09-18 DIAGNOSIS — M7989 Other specified soft tissue disorders: Secondary | ICD-10-CM

## 2015-09-20 ENCOUNTER — Ambulatory Visit (HOSPITAL_COMMUNITY): Payer: 59 | Admitting: Physical Therapy

## 2015-09-20 ENCOUNTER — Encounter (HOSPITAL_COMMUNITY): Payer: Self-pay | Admitting: Physical Therapy

## 2015-09-20 ENCOUNTER — Encounter (HOSPITAL_COMMUNITY): Payer: Self-pay

## 2015-09-20 ENCOUNTER — Telehealth (HOSPITAL_COMMUNITY): Payer: Self-pay | Admitting: Physical Therapy

## 2015-09-20 DIAGNOSIS — M25572 Pain in left ankle and joints of left foot: Secondary | ICD-10-CM

## 2015-09-20 DIAGNOSIS — M6281 Muscle weakness (generalized): Secondary | ICD-10-CM

## 2015-09-20 DIAGNOSIS — R600 Localized edema: Secondary | ICD-10-CM

## 2015-09-20 DIAGNOSIS — R262 Difficulty in walking, not elsewhere classified: Secondary | ICD-10-CM

## 2015-09-20 NOTE — Therapy (Signed)
[place], [place], [address] Phone: [phone]   Fax:  [phone]  Patient Details  Name: Bridget Roberts MRN: [id number] Date of Birth: [birth year] Referring Provider:  No ref. provider found  Encounter Date: 09/20/2015   Reviewed note from PTA at last session, in which PTA indicated that patient's MD wants to do more testing for possible DVT due to edema, patient is not currently on active blood thinners either; evaluating PT was not present and PTA did not consult evaluating or any available PTs on site at clinic before treating patient despite this acute change in patient status.   Spoke to patient, who confirmed she is not on blood thinners and further testing for DVT has not been performed yet, however she is working on scheduling this soon. Educated patient that PT is being put on hold until results from further testing is performed, due to safety concerns over possible active blood clot in combination with targeted activity to this area.   Patient agreeable at this time to holding PT and cancelling Friday's appointment, will plan to come to next skilled PT appointment next Wednesday.   No charge for today's session.   [name] PT, DPT [phone]  [place] [address] [place], [place], [address] Phone: [phone]   Fax:  [phone]

## 2015-09-20 NOTE — Telephone Encounter (Signed)
Provider advised pt to call her MD before we continue tx. Will continue on 09/27/15 if pt has f/u with MD per [facility]

## 2015-09-22 ENCOUNTER — Ambulatory Visit (HOSPITAL_COMMUNITY): Payer: 59

## 2015-09-25 ENCOUNTER — Ambulatory Visit
Admission: RE | Admit: 2015-09-25 | Discharge: 2015-09-25 | Disposition: A | Discharge status: Home / Self Care | Payer: Worker's Compensation | Source: Ambulatory Visit | Attending: Orthopaedic Surgery | Admitting: Orthopaedic Surgery

## 2015-09-25 DIAGNOSIS — M7989 Other specified soft tissue disorders: Secondary | ICD-10-CM

## 2015-09-26 ENCOUNTER — Telehealth (HOSPITAL_COMMUNITY): Payer: Self-pay

## 2015-09-26 NOTE — Telephone Encounter (Signed)
She has class tomorrow and can not come

## 2015-09-27 ENCOUNTER — Ambulatory Visit (HOSPITAL_COMMUNITY): Payer: 59

## 2015-09-29 ENCOUNTER — Ambulatory Visit (HOSPITAL_COMMUNITY): Payer: 59

## 2015-09-29 ENCOUNTER — Telehealth (HOSPITAL_COMMUNITY): Payer: Self-pay

## 2015-09-29 NOTE — Telephone Encounter (Signed)
No show, called and spoke to pt who stated she has not gotten response from MD regarding the blood clot tests.    [address], [place]; [facility] [phone]

## 2015-10-03 ENCOUNTER — Telehealth (HOSPITAL_COMMUNITY): Payer: Self-pay | Admitting: Physical Therapy

## 2015-10-03 ENCOUNTER — Ambulatory Visit (HOSPITAL_COMMUNITY): Payer: 59 | Admitting: Physical Therapy

## 2015-10-03 NOTE — Telephone Encounter (Signed)
Left message at Dr. Lorin Mercy office asking them to return call to discuss patient.   [name] PT, DPT [phone]

## 2015-10-03 NOTE — Telephone Encounter (Signed)
Patient a no-show for today's session. Called and left time/date of next scheduled session; also noted that per appt records this is patient's 2nd no-show and left message advising patient of this/that she will be DC-ed with one more no-show. Reminded patient of time/date of next scheduled session.  [name] PT, DPT [phone]

## 2015-10-03 NOTE — Telephone Encounter (Signed)
MD office returned call, stated that due to negative imaging patient is OK to return to [place], DPT [phone]

## 2015-10-03 NOTE — Telephone Encounter (Signed)
Called patient regarding her appt this afternoon, left message asking her to please call clinic back.  Bridget Roberts PT, DPT [phone]

## 2015-10-05 ENCOUNTER — Ambulatory Visit (HOSPITAL_COMMUNITY): Payer: 59 | Admitting: Physical Therapy

## 2015-10-05 ENCOUNTER — Telehealth (HOSPITAL_COMMUNITY): Payer: Self-pay

## 2015-10-05 NOTE — Telephone Encounter (Signed)
No show, called and left message informing pt of missed apt.  No further apts currently, left contact information for pt to call and make apts.    [address] [place]; [facility] [phone]

## 2015-10-12 ENCOUNTER — Ambulatory Visit (INDEPENDENT_AMBULATORY_CARE_PROVIDER_SITE_OTHER): Payer: 59 | Admitting: Family Medicine

## 2015-10-12 ENCOUNTER — Other Ambulatory Visit (HOSPITAL_COMMUNITY)
Admission: RE | Admit: 2015-10-12 | Discharge: 2015-10-12 | Disposition: A | Discharge status: Home / Self Care | Payer: 59 | Source: Ambulatory Visit | Attending: Family Medicine | Admitting: Family Medicine

## 2015-10-12 ENCOUNTER — Encounter: Payer: Self-pay | Admitting: Family Medicine

## 2015-10-12 ENCOUNTER — Other Ambulatory Visit: Payer: Self-pay | Admitting: Family Medicine

## 2015-10-12 VITALS — BP 160/96 | HR 77 | Resp 16 | Ht 64.0 in | Wt 209.0 lb

## 2015-10-12 DIAGNOSIS — Z124 Encounter for screening for malignant neoplasm of cervix: Secondary | ICD-10-CM

## 2015-10-12 DIAGNOSIS — R7302 Impaired glucose tolerance (oral): Secondary | ICD-10-CM

## 2015-10-12 DIAGNOSIS — G43101 Migraine with aura, not intractable, with status migrainosus: Secondary | ICD-10-CM

## 2015-10-12 DIAGNOSIS — Z01419 Encounter for gynecological examination (general) (routine) without abnormal findings: Secondary | ICD-10-CM | POA: Insufficient documentation

## 2015-10-12 DIAGNOSIS — Z1231 Encounter for screening mammogram for malignant neoplasm of breast: Secondary | ICD-10-CM

## 2015-10-12 DIAGNOSIS — Z114 Encounter for screening for human immunodeficiency virus [HIV]: Secondary | ICD-10-CM

## 2015-10-12 DIAGNOSIS — Z1151 Encounter for screening for human papillomavirus (HPV): Secondary | ICD-10-CM | POA: Diagnosis not present

## 2015-10-12 DIAGNOSIS — Z09 Encounter for follow-up examination after completed treatment for conditions other than malignant neoplasm: Secondary | ICD-10-CM | POA: Insufficient documentation

## 2015-10-12 DIAGNOSIS — I10 Essential (primary) hypertension: Secondary | ICD-10-CM | POA: Insufficient documentation

## 2015-10-12 DIAGNOSIS — Z1159 Encounter for screening for other viral diseases: Secondary | ICD-10-CM

## 2015-10-12 DIAGNOSIS — Z0000 Encounter for general adult medical examination without abnormal findings: Secondary | ICD-10-CM

## 2015-10-12 DIAGNOSIS — R221 Localized swelling, mass and lump, neck: Secondary | ICD-10-CM | POA: Insufficient documentation

## 2015-10-12 DIAGNOSIS — Z1211 Encounter for screening for malignant neoplasm of colon: Secondary | ICD-10-CM

## 2015-10-12 LAB — POC HEMOCCULT BLD/STL (OFFICE/1-CARD/DIAGNOSTIC): Fecal Occult Blood, POC: NEGATIVE

## 2015-10-12 MED ORDER — AMLODIPINE BESYLATE 5 MG PO TABS: 5.0000 mg | ORAL_TABLET | Freq: Every day | ORAL | 3 refills | Status: DC

## 2015-10-12 MED ORDER — SUMATRIPTAN SUCCINATE 100 MG PO TABS: ORAL_TABLET | ORAL | 1 refills | Status: DC

## 2015-10-12 NOTE — Assessment & Plan Note (Addendum)
Increased frequency , 2 in past 3 weeks, where generally had twice per year, no prescription med available , and has missed work, may need FMLA, immitrex prescribed for use when needed, needs to work on stress reduction

## 2015-10-12 NOTE — Progress Notes (Signed)
Bridget Roberts     MRN: [id number]      DOB: February 01, 1966  HPI: Patient is in for annual physical exam. C/o 2 severe  Migraines in past 3 weeks, unusual, generally 2 per year Aura is excess watering of mouth, scent of food Most recent was 8/25 till 8/26, right pounding with vomit, generally does not work on Fridays, so  No absence from work required for that episode, however,On Monday 08/21 had severe right headache with vomit , unable to work till next day Increased stress in past 3 weeks, due to family issue, which she really believes is the trigger , and she intends to work on that. C/o increased blood pressure on several occasions when she checks it, no personal h/o HTN but strong family history C/o swelling in left neck, first noted in the Ed several months ago when she went because of infected tooth on the same side, was told she states to f/u with PCP as may need biopsy, now in over 6 months later. Not able to locate area of concern at visit, believes that it may have been related to dental infection but wants it to be checked   PE: Pleasant  female, alert and oriented x 3, in no cardio-pulmonary distress. Afebrile. HEENT No facial trauma or asymetry. Sinuses non tender.  Extra occullar muscles intact, pupils equally reactive to light. External ears normal, tympanic membranes clear. Oropharynx moist, no exudate. Neck: supple, no adenopathy,JVD or thyromegaly.No bruits.Questionable left anterior neck mass, unable to localize at visit  Chest: Clear to ascultation bilaterally.No crackles or wheezes. Non tender to palpation  Breast: No asymetry,no masses or lumps. No tenderness. No nipple discharge or inversion. No axillary or supraclavicular adenopathy  Cardiovascular system; Heart sounds normal,  S1 and  S2 ,no S3.  No murmur, or thrill. Apical beat not displaced Peripheral pulses normal.  Abdomen: Soft, non tender, no organomegaly or masses. No bruits. Bowel sounds  normal. No guarding, tenderness or rebound.  Rectal:  Normal sphincter tone. No rectal mass. Guaiac negative stool.  GU: External genitalia normal female genitalia , normal female distribution of hair. No lesions. Urethral meatus normal in size, no  Prolapse, no lesions visibly  Present. Bladder non tender. Vagina pink and moist , with no visible lesions , discharge present . Adequate pelvic support no  cystocele or rectocele noted Uterus absent, no adnexal masses, no  adnexal tenderness.   Musculoskeletal exam: Full ROM of spine, hips , shoulders and knees. No deformity ,swelling or crepitus noted. No muscle wasting or atrophy.   Neurologic: Cranial nerves 2 to 12 intact. Power, tone ,sensation and reflexes normal throughout. No disturbance in gait. No tremor.  Skin: Intact, no ulceration, erythema , scaling or rash noted. Pigmentation normal throughout  Psych; Normal mood and affect. Judgement and concentration normal   Assessment & Plan:  Migraine headache Increased frequency , 2 in past 3 weeks, where generally had twice per year, no prescription med available , and has missed work, may need FMLA, immitrex prescribed for use when needed, needs to work on stress reduction  Essential hypertension Uncontrolled, new dx but strong f.h Start amlodipine DASH diet and commitment to daily physical activity for a minimum of 30 minutes discussed and encouraged, as a part of hypertension management. The importance of attaining a healthy weight is also discussed.  BP/Weight 10/12/2015 11/05/2014 10/20/2013 09/08/2013 08/11/2013 08/05/2013 [id number]  Systolic BP 0000000 [id number] [id number] -  Diastolic BP 96 71 78  80 90 62 -  Wt. (Lbs) 209 204 209.04 205 199 - 205.03  BMI 35.87 35 35.86 35.17 34.14 - 35.18       Annual physical exam Annual exam as documented. Counseling done  re healthy lifestyle involving commitment to 150 minutes exercise per week, heart healthy diet, and  attaining healthy weight.The importance of adequate sleep also discussed. Regular seat belt use and home safety, is also discussed. Changes in health habits are decided on by the patient with goals and time frames  set for achieving them. Immunization and cancer screening needs are specifically addressed at this visit.   Neck mass C/o swelling in left neck, will refer for [place] for further evaluation

## 2015-10-12 NOTE — Patient Instructions (Addendum)
F/u in 6 to 8 weeks, call if you need me before  Labs today  Please work on ;lifestyle changes as we discussed, this will improve overall health and also your blood pressure  New for your blood pressure is amlodipine 5 mg , take at same time every n evening ( bedtime) Eating mainly vegetable and fruit, low sodium and smaller portions for weight loss will imporove blood pressure also  pls schedule your mammogram  Your are referred to Dr Laural Golden for screening colonoscopy  Medication sent for migraines    Hypertension Hypertension is another name for high blood pressure. High blood pressure forces your heart to work harder to pump blood. A blood pressure reading has two numbers, which includes a higher number over a lower number (example: 110/72). HOME CARE   Have your blood pressure rechecked by your doctor.  Only take medicine as told by your doctor. Follow the directions carefully. The medicine does not work as well if you skip doses. Skipping doses also puts you at risk for problems.  Do not smoke.  Monitor your blood pressure at home as told by your doctor. GET HELP IF:  You think you are having a reaction to the medicine you are taking.  You have repeat headaches or feel dizzy.  You have puffiness (swelling) in your ankles.  You have trouble with your vision. GET HELP RIGHT AWAY IF:   You get a very bad headache and are confused.  You feel weak, numb, or faint.  You get chest or belly (abdominal) pain.  You throw up (vomit).  You cannot breathe very well. MAKE SURE YOU:   Understand these instructions.  Will watch your condition.  Will get help right away if you are not doing well or get worse.   This information is not intended to replace advice given to you by your health care provider. Make sure you discuss any questions you have with your health care provider.   Document Released: 07/17/2007 Document Revised: 02/02/2013 Document Reviewed:  11/20/2012 Elsevier Interactive Patient Education 2016 [employer] DASH stands for "Dietary Approaches to Stop Hypertension." The DASH eating plan is a healthy eating plan that has been shown to reduce high blood pressure (hypertension). Additional health benefits may include reducing the risk of type 2 diabetes mellitus, heart disease, and stroke. The DASH eating plan may also help with weight loss. WHAT DO I NEED TO KNOW ABOUT THE DASH EATING PLAN? For the DASH eating plan, you will follow these general guidelines:  Choose foods with a percent daily value for sodium of less than 5% (as listed on the food label).  Use salt-free seasonings or herbs instead of table salt or sea salt.  Check with your health care provider or pharmacist before using salt substitutes.  Eat lower-sodium products, often labeled as "lower sodium" or "no salt added."  Eat fresh foods.  Eat more vegetables, fruits, and low-fat dairy products.  Choose whole grains. Look for the word "whole" as the first word in the ingredient list.  Choose fish and skinless chicken or [place] more often than red meat. Limit fish, poultry, and meat to 6 oz (170 g) each day.  Limit sweets, desserts, sugars, and sugary drinks.  Choose heart-healthy fats.  Limit cheese to 1 oz (28 g) per day.  Eat more home-cooked food and less restaurant, buffet, and fast food.  Limit fried foods.  Cook foods using methods other than frying.  Limit canned vegetables. If you  do use them, rinse them well to decrease the sodium.  When eating at a restaurant, ask that your food be prepared with less salt, or no salt if possible. WHAT FOODS CAN I EAT? Seek help from a dietitian for individual calorie needs. Grains Whole grain or whole wheat bread. Brown rice. Whole grain or whole wheat pasta. Quinoa, bulgur, and whole grain cereals. Low-sodium cereals. Corn or whole wheat flour tortillas. Whole grain cornbread. Whole grain  crackers. Low-sodium crackers. Vegetables Fresh or frozen vegetables (raw, steamed, roasted, or grilled). Low-sodium or reduced-sodium tomato and vegetable juices. Low-sodium or reduced-sodium tomato sauce and paste. Low-sodium or reduced-sodium canned vegetables.  Fruits All fresh, canned (in natural juice), or frozen fruits. Meat and Other Protein Products Ground beef (85% or leaner), grass-fed beef, or beef trimmed of fat. Skinless chicken or [place]. Ground chicken or [place]. Pork trimmed of fat. All fish and seafood. Eggs. Dried beans, peas, or lentils. Unsalted nuts and seeds. Unsalted canned beans. Dairy Low-fat dairy products, such as skim or 1% milk, 2% or reduced-fat cheeses, low-fat ricotta or cottage cheese, or plain low-fat yogurt. Low-sodium or reduced-sodium cheeses. Fats and Oils Tub margarines without trans fats. Light or reduced-fat mayonnaise and salad dressings (reduced sodium). Avocado. Safflower, olive, or canola oils. Natural peanut or almond butter. Other Unsalted popcorn and pretzels. The items listed above may not be a complete list of recommended foods or beverages. Contact your dietitian for more options. WHAT FOODS ARE NOT RECOMMENDED? Grains White bread. White pasta. White rice. Refined cornbread. Bagels and croissants. Crackers that contain trans fat. Vegetables Creamed or fried vegetables. Vegetables in a cheese sauce. Regular canned vegetables. Regular canned tomato sauce and paste. Regular tomato and vegetable juices. Fruits Dried fruits. Canned fruit in light or heavy syrup. Fruit juice. Meat and Other Protein Products Fatty cuts of meat. Ribs, chicken wings, bacon, sausage, bologna, salami, chitterlings, fatback, hot dogs, bratwurst, and packaged luncheon meats. Salted nuts and seeds. Canned beans with salt. Dairy Whole or 2% milk, cream, half-and-half, and cream cheese. Whole-fat or sweetened yogurt. Full-fat cheeses or blue cheese. Nondairy creamers and  whipped toppings. Processed cheese, cheese spreads, or cheese curds. Condiments Onion and garlic salt, seasoned salt, table salt, and sea salt. Canned and packaged gravies. Worcestershire sauce. Tartar sauce. Barbecue sauce. Teriyaki sauce. Soy sauce, including reduced sodium. Steak sauce. Fish sauce. Oyster sauce. Cocktail sauce. Horseradish. Ketchup and mustard. Meat flavorings and tenderizers. Bouillon cubes. Hot sauce. Tabasco sauce. Marinades. Taco seasonings. Relishes. Fats and Oils Butter, stick margarine, lard, shortening, ghee, and bacon fat. Coconut, palm kernel, or palm oils. Regular salad dressings. Other Pickles and olives. Salted popcorn and pretzels. The items listed above may not be a complete list of foods and beverages to avoid. Contact your dietitian for more information. WHERE CAN I FIND MORE INFORMATION? National Heart, Lung, and Blood Institute: travelstabloid.com   This information is not intended to replace advice given to you by your health care provider. Make sure you discuss any questions you have with your health care provider.   Document Released: 01/17/2011 Document Revised: 02/18/2014 Document Reviewed: 12/02/2012 Elsevier Interactive Patient Education [employer].

## 2015-10-12 NOTE — Assessment & Plan Note (Signed)
Uncontrolled, new dx but strong f.h Start amlodipine DASH diet and commitment to daily physical activity for a minimum of 30 minutes discussed and encouraged, as a part of hypertension management. The importance of attaining a healthy weight is also discussed.  BP/Weight 10/12/2015 11/05/2014 10/20/2013 09/08/2013 08/11/2013 08/05/2013 [id number]  Systolic BP 0000000 [id number] [id number] 123456 123456 -  Diastolic BP 96 71 78 80 90 62 -  Wt. (Lbs) 209 204 209.04 205 199 - 205.03  BMI 35.87 35 35.86 35.17 34.14 - 35.18

## 2015-10-13 ENCOUNTER — Ambulatory Visit (HOSPITAL_COMMUNITY)
Admission: RE | Admit: 2015-10-13 | Discharge: 2015-10-13 | Disposition: A | Discharge status: Home / Self Care | Payer: 59 | Source: Ambulatory Visit | Attending: Family Medicine | Admitting: Family Medicine

## 2015-10-13 ENCOUNTER — Encounter (INDEPENDENT_AMBULATORY_CARE_PROVIDER_SITE_OTHER): Payer: Self-pay | Admitting: *Deleted

## 2015-10-13 DIAGNOSIS — Z1231 Encounter for screening mammogram for malignant neoplasm of breast: Secondary | ICD-10-CM | POA: Diagnosis present

## 2015-10-13 LAB — CBC
HCT: 43.4 % (ref 35.0–45.0)
HEMOGLOBIN: 14.5 g/dL (ref 11.7–15.5)
MCH: 28.7 pg (ref 27.0–33.0)
MCHC: 33.4 g/dL (ref 32.0–36.0)
MCV: 85.8 fL (ref 80.0–100.0)
MPV: 9.4 fL (ref 7.5–12.5)
PLATELETS: 312 10*3/uL (ref 140–400)
RBC: 5.06 MIL/uL (ref 3.80–5.10)
RDW: 13.9 % (ref 11.0–15.0)
WBC: 7 10*3/uL (ref 3.8–10.8)

## 2015-10-13 LAB — COMPREHENSIVE METABOLIC PANEL
ALBUMIN: 3.7 g/dL (ref 3.6–5.1)
ALK PHOS: 51 U/L (ref 33–130)
ALT: 9 U/L (ref 6–29)
AST: 13 U/L (ref 10–35)
BILIRUBIN TOTAL: 0.5 mg/dL (ref 0.2–1.2)
BUN: 12 mg/dL (ref 7–25)
CALCIUM: 9 mg/dL (ref 8.6–10.4)
CO2: 26 mmol/L (ref 20–31)
CREATININE: 0.87 mg/dL (ref 0.50–1.05)
Chloride: 104 mmol/L (ref 98–110)
Glucose, Bld: 91 mg/dL (ref 65–99)
Potassium: 4 mmol/L (ref 3.5–5.3)
SODIUM: 138 mmol/L (ref 135–146)
TOTAL PROTEIN: 6.7 g/dL (ref 6.1–8.1)

## 2015-10-13 LAB — LIPID PANEL
CHOLESTEROL: 189 mg/dL (ref 125–200)
HDL: 69 mg/dL (ref >=46)
LDL Cholesterol: 104 mg/dL (ref <130)
TRIGLYCERIDES: 82 mg/dL (ref <150)
Total CHOL/HDL Ratio: 2.7 Ratio (ref <=5.0)
VLDL: 16 mg/dL (ref <30)

## 2015-10-13 LAB — HIV ANTIBODY (ROUTINE TESTING W REFLEX): HIV: NONREACTIVE

## 2015-10-13 LAB — HEPATITIS C ANTIBODY: HCV AB: NEGATIVE

## 2015-10-14 LAB — VITAMIN D 25 HYDROXY (VIT D DEFICIENCY, FRACTURES): VIT D 25 HYDROXY: 31 ng/mL (ref 30–100)

## 2015-10-14 LAB — TSH: TSH: 2.33 mIU/L

## 2015-10-15 NOTE — Assessment & Plan Note (Signed)

## 2015-10-15 NOTE — Assessment & Plan Note (Signed)
C/o swelling in left neck, will refer for [place] for further evaluation

## 2015-10-17 ENCOUNTER — Ambulatory Visit (HOSPITAL_COMMUNITY): Admission: RE | Admit: 2015-10-17 | Payer: 59 | Source: Ambulatory Visit

## 2015-10-18 LAB — CYTOLOGY - PAP

## 2015-10-19 ENCOUNTER — Other Ambulatory Visit: Payer: Self-pay

## 2015-10-19 MED ORDER — FLUCONAZOLE 150 MG PO TABS: 150.0000 mg | ORAL_TABLET | Freq: Once | ORAL | 0 refills | Status: AC

## 2015-10-24 ENCOUNTER — Encounter: Payer: Self-pay | Admitting: Family Medicine

## 2015-10-31 ENCOUNTER — Ambulatory Visit (HOSPITAL_COMMUNITY): Admission: RE | Admit: 2015-10-31 | Payer: 59 | Source: Ambulatory Visit

## 2016-01-12 ENCOUNTER — Encounter (INDEPENDENT_AMBULATORY_CARE_PROVIDER_SITE_OTHER): Payer: Self-pay | Admitting: Orthopaedic Surgery

## 2016-01-12 ENCOUNTER — Ambulatory Visit (INDEPENDENT_AMBULATORY_CARE_PROVIDER_SITE_OTHER): Payer: Worker's Compensation | Admitting: Orthopaedic Surgery

## 2016-01-12 VITALS — BP 141/87 | HR 67 | Ht 64.0 in | Wt 205.0 lb

## 2016-01-12 DIAGNOSIS — S9032XD Contusion of left foot, subsequent encounter: Secondary | ICD-10-CM | POA: Diagnosis not present

## 2016-01-12 NOTE — Progress Notes (Signed)
Office Visit Note   Patient: Bridget Roberts           Date of Birth: December 09, 1965           MRN: [id number] Visit Date: 01/12/2016              Requested by: Fayrene Helper, MD [address], [place], [place] [address] PCP: Tula Nakayama, MD   Assessment & Plan: Visit Diagnoses:  Plan: The patient still states she she has some stiffness aching that bothers her in the morning she is on her feet all day long for ten-hour shifts. She normally wears steel toed shoes when she works. She states she has pain over the left heel points to the plantar surface at the plantar fascial origin. She is a mature with normal heel toe gait. Original injury was 07/30/2015 she had a bone scan that showed some increased uptake on the dorsum of her foot and small chip avulsion at the navicular bone. Patient had soft tissue contusion when a roll of paper fell and hit her on the dorsum of the foot. We'll apply some Visco heels that she can use as needed to help cushion the heel. We discussed some plantar fascial stretching exercises that she could you used with the frozen or juicer frozen lemonade can. She's been through physical therapy she's had plain radiographs 3 phase bone scans been obtained.  Based on the injured her foot or greater impairment 5% of the left foot office follow-up when necessary patient is at [facility] and is rated and released. Rehabilitation nurse Meredeth Ide was present for today's discussion fax number [phone] Follow-Up Instructions: Return if symptoms worsen or fail to improve.   Orders:  No orders of the defined types were placed in this encounter.  No orders of the defined types were placed in this encounter.     Procedures: No procedures performed   Clinical Data: No additional findings.   Subjective: Chief Complaint  Patient presents with  . Left Foot - Follow-up    Patient returns for 2 month follow up left foot. Her initial date of injury was 07/30/2015.  She is under [employer]. She states that her foot still gets stiff a lot. She continues to have pain in the bottom of her heel. She is not taking anything for pain.    Review of Systems  Constitutional: Negative for chills and diaphoresis.  HENT: Negative for ear discharge, ear pain and nosebleeds.   Eyes: Negative for discharge and visual disturbance.  Respiratory: Negative for cough, choking and shortness of breath.   Cardiovascular: Negative for chest pain and palpitations.  Gastrointestinal: Negative for abdominal distention and abdominal pain.  Endocrine: Negative for cold intolerance and heat intolerance.  Genitourinary: Negative for flank pain and hematuria.  Musculoskeletal: Positive for back pain.  Skin: Negative for rash and wound.  Neurological: Negative for seizures and speech difficulty.  Hematological: Negative for adenopathy. Does not bruise/bleed easily.  Psychiatric/Behavioral: Negative for agitation and suicidal ideas.     Objective: Vital Signs: BP (!) 141/87   Pulse 67   Ht 5\' 4"  (1.626 m)   Wt 205 lb (93 kg)   LMP 01/29/2013   BMI 35.19 kg/m   Physical Exam  Constitutional: She is oriented to person, place, and time. She appears well-developed.  HENT:  Head: Normocephalic.  Right Ear: External ear normal.  Left Ear: External ear normal.  Eyes: Pupils are equal, round, and reactive to light.  Neck: No tracheal deviation present. No thyromegaly present.  Cardiovascular: Normal rate.   Pulmonary/Chest: Effort normal.  Abdominal: Soft.  Musculoskeletal:  Patient's a mature pelvis is level normal heel toe gait. No pitting edema. Dorsum of the foot is normal. She has tenderness over the plantar fascial origin at the calcaneus. No palpable nodules in the plantar fascia.  Neurological: She is alert and oriented to person, place, and time.  Skin: Skin is warm and dry.  Psychiatric: She has a normal mood and affect. Her behavior is normal.    Ortho  Exam  Specialty Comments:  No specialty comments available.  Imaging: No results found.   PMFS History: Patient Active Problem List   Diagnosis Date Noted  . Annual physical exam 10/12/2015  . Neck mass 10/12/2015  . Essential hypertension 10/12/2015  . 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 Migraine headache 09/04/2012  . IGT (impaired glucose tolerance) 05/11/2012  . Other and unspecified hyperlipidemia 05/11/2012  . OBESITY, UNSPECIFIED 06/01/2007  . BACK PAIN 06/01/2007   Past Medical History:  Diagnosis Date  . Asthma   . Back pain   . GERD (gastroesophageal reflux disease)   . Headache(784.0)   . Migraines   . PONV (postoperative nausea and vomiting)     Family History  Problem Relation Age of Onset  . Prostate cancer Father   . Heart attack Mother   . Hypertension Sister   . Diabetes Sister   . Hypertension Brother   . Hypertension Sister   . Hypertension Brother   . Hypertension Brother     Past Surgical History:  Procedure Laterality Date  . ECTOPIC PREGNANCY SURGERY    . ENDOMETRIAL ABLATION    . POLYPECTOMY     vocal chords  . SALPINGECTOMY  right   unilateral  . VAGINAL HYSTERECTOMY N/A 08/04/2013   Procedure: HYSTERECTOMY VAGINAL;  Surgeon: Florian Buff, MD;  Location: [facility];  Service: Gynecology;  Laterality: N/A;  . vocal chord polyp     Social History   Occupational History  . Not on file.   Social History Main Topics  . Smoking status: Never Smoker  . Smokeless tobacco: Never Used  . Alcohol use No  . Drug use: No  . Sexual activity: Not Currently    Birth control/ protection: Surgical

## 2016-01-24 ENCOUNTER — Other Ambulatory Visit: Payer: Self-pay | Admitting: Family Medicine

## 2016-01-24 DIAGNOSIS — J4520 Mild intermittent asthma, uncomplicated: Secondary | ICD-10-CM

## 2016-01-25 ENCOUNTER — Telehealth: Payer: Self-pay | Admitting: Family Medicine

## 2016-01-25 NOTE — Telephone Encounter (Signed)
[name] is c/o of the new medication for Blood Pressure is triggering her asthma and she is asking for a refill on her Inhaler, please advise?

## 2016-01-26 NOTE — Telephone Encounter (Signed)
Called and left message for patient to return call.  

## 2016-02-23 DIAGNOSIS — R05 Cough: Secondary | ICD-10-CM | POA: Diagnosis not present

## 2016-02-23 DIAGNOSIS — J329 Chronic sinusitis, unspecified: Secondary | ICD-10-CM | POA: Diagnosis not present

## 2016-03-06 ENCOUNTER — Encounter: Payer: Self-pay | Admitting: Family Medicine

## 2016-03-06 ENCOUNTER — Ambulatory Visit: Payer: Self-pay | Admitting: Family Medicine

## 2016-05-09 ENCOUNTER — Ambulatory Visit (INDEPENDENT_AMBULATORY_CARE_PROVIDER_SITE_OTHER): Payer: 59 | Admitting: Family Medicine

## 2016-05-09 ENCOUNTER — Encounter: Payer: Self-pay | Admitting: Family Medicine

## 2016-05-09 VITALS — BP 120/82 | HR 74 | Resp 16 | Ht 64.0 in | Wt 218.0 lb

## 2016-05-09 DIAGNOSIS — I10 Essential (primary) hypertension: Secondary | ICD-10-CM | POA: Diagnosis not present

## 2016-05-09 DIAGNOSIS — Z23 Encounter for immunization: Secondary | ICD-10-CM

## 2016-05-09 DIAGNOSIS — R491 Aphonia: Secondary | ICD-10-CM | POA: Diagnosis not present

## 2016-05-09 NOTE — Patient Instructions (Addendum)
f/u in 3 month, call if you need me before  TdAP today  pls call and re sschedule your colonoscopy  Excellent BP BUT you are working on alternative rearmament  It is important that you exercise regularly at least 30 minutes 7 times a week. If you develop chest pain, have severe difficulty breathing, or feel very tired, stop exercising immediately and seek medical attention   Weight loss goal of 8 pounds Thanks for choosing [facility], we consider it a privelige to serve you.  You are referred to Dr ENT to evaluate larynx with h/o nodules and loss of voice

## 2016-05-10 DIAGNOSIS — Z23 Encounter for immunization: Secondary | ICD-10-CM | POA: Insufficient documentation

## 2016-05-10 DIAGNOSIS — R491 Aphonia: Secondary | ICD-10-CM | POA: Insufficient documentation

## 2016-05-10 NOTE — Assessment & Plan Note (Signed)
After obtaining informed consent, the vaccine is  administered by LPN.  

## 2016-05-10 NOTE — Assessment & Plan Note (Signed)
.,  con1  DASH diet and commitment to daily physical activity for a minimum of 30 minutes discussed and encouraged, as a part of hypertension management. The importance of attaining a healthy weight is also discussed.  BP/Weight 05/09/2016 01/12/2016 10/12/2015 11/05/2014 10/20/2013 08/15/8887 02/17/9448  Systolic BP 388 828 003 491 791 505 697  Diastolic BP 82 87 96 71 78 80 90  Wt. (Lbs) 218 205 209 204 209.04 205 199  BMI 37.42 35.19 35.87 35 35.86 35.17 34.14

## 2016-05-10 NOTE — Progress Notes (Signed)
   Bridget Roberts     MRN: [id number]      DOB: [birth year]   HPI Ms. Filler is here for follow up and re-evaluation of chronic medical conditions, medication management and review of any available recent lab and radiology data.  Preventive health is updated, specifically  Cancer screening and Immunization.    The PT denies any adverse reactions to current medications since the last visit.  c/o weight gain despite dietary discretion, frustrated as her goal is to lose weight and get off of medication Was told at urgent care that her tonsils are large, has h/o polyps on her larynx, and had been on PPI therapy in the past. Does report loss of ability to sing , will have ENT re evaluation  ROS Denies recent fever or chills. Denies sinus pressure, nasal congestion, ear pain or sore throat. Denies chest congestion, productive cough or wheezing. Denies chest pains, palpitations and leg swelling Denies abdominal pain, nausea, vomiting,diarrhea or constipation.   Denies dysuria, frequency, hesitancy or incontinence. Denies joint pain, swelling and limitation in mobility. Denies headaches, seizures, numbness, or tingling. Denies depression, anxiety or insomnia. Denies skin break down or rash.   PE  BP 120/82   Pulse 74   Resp 16   Ht 5\' 4"  (1.626 m)   Wt 218 lb (98.9 kg)   LMP 01/29/2013   SpO2 98%   BMI 37.42 kg/m   Patient alert and oriented and in no cardiopulmonary distress.  HEENT: No facial asymmetry, EOMI,   oropharynx pink and moist.  Neck supple no JVD, no mass.Mild tonsillar enlargement, no exudate, no cervical adenopathy, TM clear  Chest: Clear to auscultation bilaterally.  CVS: S1, S2 no murmurs, no S3.Regular rate.  ABD: Soft non tender.   Ext: No edema  MS: Adequate ROM spine, shoulders, hips and knees.  Skin: Intact, no ulcerations or rash noted.  Psych: Good eye contact, normal affect. Memory intact not anxious or depressed appearing.  CNS: CN 2-12 intact,  power,  normal throughout.no focal deficits noted.   Assessment & Plan  Essential hypertension .,con1  DASH diet and commitment to daily physical activity for a minimum of 30 minutes discussed and encouraged, as a part of hypertension management. The importance of attaining a healthy weight is also discussed.  BP/Weight 05/09/2016 01/12/2016 10/12/2015 11/05/2014 10/20/2013 4/33/2951 09/18/4164  Systolic BP 063 016 010 932 355 732 202  Diastolic BP 82 87 96 71 78 80 90  Wt. (Lbs) 218 205 209 204 209.04 205 199  BMI 37.42 35.19 35.87 35 35.86 35.17 34.14       Morbid obesity (HCC) Deteriorated. Patient re-educated about  the importance of commitment to a  minimum of 150 minutes of exercise per week.  The importance of healthy food choices with portion control discussed. Encouraged to start a food diary, count calories and to consider  joining a support group. Sample diet sheets offered. Goals set by the patient for the next several months.   Weight /BMI 05/09/2016 01/12/2016 10/12/2015  WEIGHT 218 lb 205 lb 209 lb  HEIGHT 5\' 4"  5\' 4"  5\' 4"   BMI 37.42 kg/m2 35.19 kg/m2 35.87 kg/m2      Need for Tdap vaccination After obtaining informed consent, the vaccine is  administered by LPN.   Loss of voice c/o loss of ability to sing as in the past with h/o GERD and nodules on larynx, will refer to ENT

## 2016-05-10 NOTE — Assessment & Plan Note (Signed)
c/o loss of ability to sing as in the past with h/o GERD and nodules on larynx, will refer to ENT

## 2016-05-10 NOTE — Assessment & Plan Note (Signed)
Deteriorated. Patient re-educated about  the importance of commitment to a  minimum of 150 minutes of exercise per week.  The importance of healthy food choices with portion control discussed. Encouraged to start a food diary, count calories and to consider  joining a support group. Sample diet sheets offered. Goals set by the patient for the next several months.   Weight /BMI 05/09/2016 01/12/2016 10/12/2015  WEIGHT 218 lb 205 lb 209 lb  HEIGHT 5\' 4"  5\' 4"  5\' 4"   BMI 37.42 kg/m2 35.19 kg/m2 35.87 kg/m2

## 2016-07-29 ENCOUNTER — Other Ambulatory Visit: Payer: Self-pay | Admitting: Family Medicine

## 2016-08-08 ENCOUNTER — Ambulatory Visit: Payer: Self-pay | Admitting: Family Medicine

## 2016-11-06 DIAGNOSIS — R110 Nausea: Secondary | ICD-10-CM | POA: Diagnosis not present

## 2016-11-06 DIAGNOSIS — R51 Headache: Secondary | ICD-10-CM | POA: Diagnosis not present

## 2017-02-14 ENCOUNTER — Telehealth: Payer: Self-pay | Admitting: Family Medicine

## 2017-02-14 NOTE — Telephone Encounter (Signed)
Patient needs blood pressure medicine--has appt at the end of the month

## 2017-02-17 ENCOUNTER — Other Ambulatory Visit: Payer: Self-pay

## 2017-02-17 MED ORDER — AMLODIPINE BESYLATE 5 MG PO TABS: 5.0000 mg | ORAL_TABLET | Freq: Every day | ORAL | 3 refills | Status: DC

## 2017-03-11 ENCOUNTER — Other Ambulatory Visit: Payer: Self-pay

## 2017-03-11 ENCOUNTER — Encounter: Payer: Self-pay | Admitting: Family Medicine

## 2017-03-11 ENCOUNTER — Ambulatory Visit (INDEPENDENT_AMBULATORY_CARE_PROVIDER_SITE_OTHER): Payer: 59 | Admitting: Family Medicine

## 2017-03-11 VITALS — BP 120/80 | HR 64 | Temp 98.4°F | Resp 16 | Ht 64.0 in | Wt 213.2 lb

## 2017-03-11 DIAGNOSIS — R7989 Other specified abnormal findings of blood chemistry: Secondary | ICD-10-CM

## 2017-03-11 DIAGNOSIS — J45909 Unspecified asthma, uncomplicated: Secondary | ICD-10-CM | POA: Diagnosis not present

## 2017-03-11 DIAGNOSIS — R7302 Impaired glucose tolerance (oral): Secondary | ICD-10-CM

## 2017-03-11 DIAGNOSIS — Z1231 Encounter for screening mammogram for malignant neoplasm of breast: Secondary | ICD-10-CM | POA: Diagnosis not present

## 2017-03-11 DIAGNOSIS — I10 Essential (primary) hypertension: Secondary | ICD-10-CM

## 2017-03-11 DIAGNOSIS — G43119 Migraine with aura, intractable, without status migrainosus: Secondary | ICD-10-CM | POA: Diagnosis not present

## 2017-03-11 NOTE — Patient Instructions (Addendum)
Physical with pap August 31 or shortly after , call if you need me sooner  Please get mammogram before, we will schedule and call you with appt info, Friday appt needed  Colonosocopy referral will be made again  BP is good.. It is important that you exercise regularly at least 30 minutes 5 times a week. If you develop chest pain, have severe difficulty breathing, or feel very tired, stop exercising immediately and seek medical attention    Fasting labs asap

## 2017-03-11 NOTE — Progress Notes (Signed)
Bridget Roberts     MRN: [id number]      DOB: 07-08-1965   HPI Ms. Bonifas is here for follow up and re-evaluation of chronic medical conditions, medication management and review of any available recent lab and radiology data.  . The PT denies any adverse reactions to current medications since the last visit.   Migraines on average twice per year , last on average 2 days, needs FMLA form completion for this  Unable to exercise regularly due to current work schedule , states she plans to work on changing this as well as her diet , as weight loss is a priority  ROS Denies recent fever or chills. Denies sinus pressure, nasal congestion, ear pain or sore throat. Denies chest congestion, productive cough or wheezing. Denies chest pains, palpitations and leg swelling Denies abdominal pain, nausea, vomiting,diarrhea or constipation.   Denies dysuria, frequency, hesitancy or incontinence. Denies joint pain, swelling and limitation in mobility. Denies uncontrolled  Headaches, denies  seizures, numbness, or tingling. Denies depression, anxiety or insomnia. Denies skin break down or rash.   PE  BP 120/80 (BP Location: Left Arm, Patient Position: Sitting, Cuff Size: Normal)   Pulse 64   Temp 98.4 F (36.9 C) (Temporal)   Resp 16   Ht 5\' 4"  (1.626 m)   Wt 213 lb 4 oz (96.7 kg)   LMP 01/29/2013   SpO2 98%   BMI 36.60 kg/m   Patient alert and oriented and in no cardiopulmonary distress.  HEENT: No facial asymmetry, EOMI,   oropharynx pink and moist.  Neck supple no JVD, no mass.  Chest: Clear to auscultation bilaterally.  CVS: S1, S2 no murmurs, no S3.Regular rate.  ABD: Soft non tender.   Ext: No edema  MS: Adequate ROM spine, shoulders, hips and knees.  Skin: Intact, no ulcerations or rash noted.  Psych: Good eye contact, normal affect. Memory intact not anxious or depressed appearing.  CNS: CN 2-12 intact, power,  normal throughout.no focal deficits  noted.   Assessment & Plan  Essential hypertension Controlled, no change in medication DASH diet and commitment to daily physical activity for a minimum of 30 minutes discussed and encouraged, as a part of hypertension management. The importance of attaining a healthy weight is also discussed.  BP/Weight 03/11/2017 05/09/2016 01/12/2016 10/12/2015 11/05/2014 10/20/2013 03/15/5425  Systolic BP 062 376 283 151 761 607 371  Diastolic BP 80 82 87 96 71 78 80  Wt. (Lbs) 213.25 218 205 209 204 209.04 205  BMI 36.6 37.42 35.19 35.87 35 35.86 35.17       Migraine headache Improved control with daily topamax reports on average  Migraines twice pe year, duration 2 days each episode, continue current management  Intrinsic asthma No recent flares,has albuterol for as needed use , which is infrequent  Morbid obesity ([place]) improved Patient re-educated about  the importance of commitment to a  minimum of 150 minutes of exercise per week.  The importance of healthy food choices with portion control discussed. Encouraged to start a food diary, count calories and to consider  joining a support group. Sample diet sheets offered. Goals set by the patient for the next several months.   Weight /BMI 03/11/2017 05/09/2016 01/12/2016  WEIGHT 213 lb 4 oz 218 lb 205 lb  HEIGHT 5\' 4"  5\' 4"  5\' 4"   BMI 36.6 kg/m2 37.42 kg/m2 35.19 kg/m2      Need for prophylactic vaccination and inoculation against influenza Pt refused

## 2017-03-12 ENCOUNTER — Encounter: Payer: Self-pay | Admitting: Family Medicine

## 2017-03-12 NOTE — Assessment & Plan Note (Signed)
Improved control with daily topamax reports on average  Migraines twice pe year, duration 2 days each episode, continue current management

## 2017-03-12 NOTE — Assessment & Plan Note (Signed)
Controlled, no change in medication DASH diet and commitment to daily physical activity for a minimum of 30 minutes discussed and encouraged, as a part of hypertension management. The importance of attaining a healthy weight is also discussed.  BP/Weight 03/11/2017 05/09/2016 01/12/2016 10/12/2015 11/05/2014 10/20/2013 08/07/348  Systolic BP 093 818 299 371 696 789 381  Diastolic BP 80 82 87 96 71 78 80  Wt. (Lbs) 213.25 218 205 209 204 209.04 205  BMI 36.6 37.42 35.19 35.87 35 35.86 35.17

## 2017-03-12 NOTE — Assessment & Plan Note (Signed)
No recent flares,has albuterol for as needed use , which is infrequent

## 2017-03-12 NOTE — Assessment & Plan Note (Signed)
improved Patient re-educated about  the importance of commitment to a  minimum of 150 minutes of exercise per week.  The importance of healthy food choices with portion control discussed. Encouraged to start a food diary, count calories and to consider  joining a support group. Sample diet sheets offered. Goals set by the patient for the next several months.   Weight /BMI 03/11/2017 05/09/2016 01/12/2016  WEIGHT 213 lb 4 oz 218 lb 205 lb  HEIGHT 5\' 4"  5\' 4"  5\' 4"   BMI 36.6 kg/m2 37.42 kg/m2 35.19 kg/m2

## 2017-03-12 NOTE — Assessment & Plan Note (Addendum)
Pt refused.

## 2017-03-14 ENCOUNTER — Ambulatory Visit (HOSPITAL_COMMUNITY): Payer: Self-pay

## 2017-07-02 ENCOUNTER — Emergency Department (HOSPITAL_COMMUNITY)
Admission: EM | Admit: 2017-07-02 | Discharge: 2017-07-02 | Disposition: A | Discharge status: Home / Self Care | Payer: 59 | Attending: Emergency Medicine | Admitting: Emergency Medicine

## 2017-07-02 ENCOUNTER — Other Ambulatory Visit: Payer: Self-pay

## 2017-07-02 ENCOUNTER — Ambulatory Visit: Payer: 59 | Admitting: Family Medicine

## 2017-07-02 ENCOUNTER — Encounter (HOSPITAL_COMMUNITY): Payer: Self-pay | Admitting: Emergency Medicine

## 2017-07-02 ENCOUNTER — Emergency Department (HOSPITAL_COMMUNITY): Payer: 59

## 2017-07-02 ENCOUNTER — Encounter: Payer: Self-pay | Admitting: Family Medicine

## 2017-07-02 VITALS — BP 122/84 | HR 66 | Resp 16 | Ht 64.0 in | Wt 212.0 lb

## 2017-07-02 DIAGNOSIS — Z09 Encounter for follow-up examination after completed treatment for conditions other than malignant neoplasm: Secondary | ICD-10-CM | POA: Diagnosis not present

## 2017-07-02 DIAGNOSIS — Z1231 Encounter for screening mammogram for malignant neoplasm of breast: Secondary | ICD-10-CM

## 2017-07-02 DIAGNOSIS — R0789 Other chest pain: Secondary | ICD-10-CM

## 2017-07-02 DIAGNOSIS — R002 Palpitations: Secondary | ICD-10-CM | POA: Diagnosis not present

## 2017-07-02 DIAGNOSIS — R000 Tachycardia, unspecified: Secondary | ICD-10-CM | POA: Diagnosis not present

## 2017-07-02 DIAGNOSIS — E785 Hyperlipidemia, unspecified: Secondary | ICD-10-CM

## 2017-07-02 DIAGNOSIS — J45909 Unspecified asthma, uncomplicated: Secondary | ICD-10-CM | POA: Insufficient documentation

## 2017-07-02 DIAGNOSIS — Z1211 Encounter for screening for malignant neoplasm of colon: Secondary | ICD-10-CM

## 2017-07-02 DIAGNOSIS — Z79899 Other long term (current) drug therapy: Secondary | ICD-10-CM | POA: Diagnosis not present

## 2017-07-02 DIAGNOSIS — R221 Localized swelling, mass and lump, neck: Secondary | ICD-10-CM

## 2017-07-02 DIAGNOSIS — I10 Essential (primary) hypertension: Secondary | ICD-10-CM | POA: Insufficient documentation

## 2017-07-02 DIAGNOSIS — Z13220 Encounter for screening for lipoid disorders: Secondary | ICD-10-CM

## 2017-07-02 DIAGNOSIS — E559 Vitamin D deficiency, unspecified: Secondary | ICD-10-CM

## 2017-07-02 LAB — CBC WITH DIFFERENTIAL/PLATELET
BASOS ABS: 0 10*3/uL (ref 0.0–0.1)
Basophils Relative: 0 %
Eosinophils Absolute: 0.1 10*3/uL (ref 0.0–0.7)
Eosinophils Relative: 2 %
HEMATOCRIT: 42.5 % (ref 36.0–46.0)
Hemoglobin: 13.8 g/dL (ref 12.0–15.0)
LYMPHS PCT: 37 %
Lymphs Abs: 2.5 10*3/uL (ref 0.7–4.0)
MCH: 27.8 pg (ref 26.0–34.0)
MCHC: 32.5 g/dL (ref 30.0–36.0)
MCV: 85.7 fL (ref 78.0–100.0)
Monocytes Absolute: 0.5 10*3/uL (ref 0.1–1.0)
Monocytes Relative: 7 %
NEUTROS ABS: 3.7 10*3/uL (ref 1.7–7.7)
Neutrophils Relative %: 54 %
Platelets: 294 10*3/uL (ref 150–400)
RBC: 4.96 MIL/uL (ref 3.87–5.11)
RDW: 13.3 % (ref 11.5–15.5)
WBC: 6.8 10*3/uL (ref 4.0–10.5)

## 2017-07-02 LAB — BASIC METABOLIC PANEL
ANION GAP: 7 (ref 5–15)
BUN: 14 mg/dL (ref 6–20)
CHLORIDE: 102 mmol/L (ref 101–111)
CO2: 29 mmol/L (ref 22–32)
Calcium: 9.1 mg/dL (ref 8.9–10.3)
Creatinine, Ser: 0.88 mg/dL (ref 0.44–1.00)
GFR calc Af Amer: >60 mL/min (ref >60)
GFR calc non Af Amer: >60 mL/min (ref >60)
GLUCOSE: 115 mg/dL — AB (ref 65–99)
POTASSIUM: 3.7 mmol/L (ref 3.5–5.1)
Sodium: 138 mmol/L (ref 135–145)

## 2017-07-02 LAB — D-DIMER, QUANTITATIVE: D-Dimer, Quant: <0.27 ug/mL-FEU (ref 0.00–0.50)

## 2017-07-02 LAB — TROPONIN I: Troponin I: <0.03 ng/mL (ref <0.03)

## 2017-07-02 MED ORDER — NITROGLYCERIN 0.4 MG SL SUBL
0.4000 mg | SUBLINGUAL_TABLET | SUBLINGUAL | Status: DC | PRN
  Administered 2017-07-02: 0.4 mg via SUBLINGUAL
  Filled 2017-07-02: qty 1

## 2017-07-02 MED ORDER — ASPIRIN 81 MG PO CHEW
324.0000 mg | CHEWABLE_TABLET | Freq: Once | ORAL | Status: AC
  Administered 2017-07-02: 324 mg via ORAL
  Filled 2017-07-02: qty 4

## 2017-07-02 NOTE — Patient Instructions (Addendum)
Annual physical exam in 4 to 4.5 months, call if  You need me before  Please schedule on a FRIDAY mammogram which is past due at checkout  Fasting lipid and vitamin D  And TSH this Friday     Ibuprofen 200 mg one three times daily for 3 to 5 days for left chest wall pain   We will call with appt for US neck will request Friday appt  I will see if you Lucianne Lei get sooner appt with cardiology they will notify you if that is possible  You are refererd for colonoscopy that office  [facility] will call you

## 2017-07-02 NOTE — Discharge Instructions (Addendum)
Take aspirin 81 mg every day until you see the cardiologist. Take your usual prescriptions as previously directed.  Avoid avoid caffinated products, such as teas, colas, coffee, chocolate. Avoid over the counter cold medicines, herbal or "natural vitamin" products, and illicit drugs because they can contain stimulants. Call your regular medical doctor today to schedule a follow up appointment within the next 2 days. Call the Cardiologist today to schedule a follow up appointment within the next week.  Return to the Emergency Department immediately sooner if worsening. Return if symptoms are getting worse.

## 2017-07-02 NOTE — ED Triage Notes (Signed)
Pt states she noticed her heart racing around 0200 this AM. Pt states her chest "does not feel right." Pt denies N/V. Pt states she has had hot flashes.

## 2017-07-02 NOTE — ED Notes (Signed)
Patient given cup of water per her request.  Dr. Approved.

## 2017-07-02 NOTE — ED Provider Notes (Signed)
Pt received at sign out with 2nd troponin pending. Pt c/o palpitations and heavy chest feeling since 0200 overnight. Workup reassuring, including negative d-dimer and troponin x2. Low risk heart score, will d/c with Cards MD f/u. Dx and testing d/w pt and family.  Questions answered.  Verb understanding, agreeable to d/c home with outpt f/u.    Francine Graven, DO 07/02/17 [phone]

## 2017-07-02 NOTE — Progress Notes (Signed)
Bridget Roberts     MRN: [id number]      DOB: 14-Jul-1965   HPI Bridget Roberts is here for follow up of recent ED visit, seen this morning  with an episode of chest pain and tachycardia which woke her up, states this is the 2nd episode first time was approximately 2 years ago. Although she r/o for ACS , she is extremely worried based on the severity of her symptoms, there is no known f/h of CAD. She denies symptoms of GAD, or panic, and has no new specific stress that is disabling She denies symptoms of GERD C/o mass on left side of her neck which has increased in size which she wants imaged, it is non tender  Needs colonoscopy and is ready to have this done ROS Denies recent fever or chills. Denies sinus pressure, nasal congestion, ear pain or sore throat. Denies chest congestion, productive cough or wheezing. Denies PND, orthopnea, palpitations and leg swelling Denies abdominal pain, nausea, vomiting,diarrhea or constipation.   Denies dysuria, frequency, hesitancy or incontinence. Denies joint pain, swelling and limitation in mobility. Denies headaches, seizures, numbness, or tingling. Denies depression, anxiety or insomnia. Denies skin break down or rash.   PE  BP 122/84   Pulse 66   Resp 16   Ht 5\' 4"  (1.626 m)   Wt 212 lb (96.2 kg)   LMP 01/29/2013   SpO2 95%   BMI 36.39 kg/m   Patient alert and oriented and in no cardiopulmonary distress.  HEENT: No facial asymmetry, EOMI,   oropharynx pink and moist.  Neck supple no JVD, left neck mass.approx 2 cm diameter non tender  Chest: Clear to auscultation bilaterally.no reproducible chest pain  CVS: S1, S2 no murmurs, no S3.Regular rate.  ABD: Soft non tender.   Ext: No edema  MS: Adequate ROM spine, shoulders, hips and knees.  Skin: Intact, no ulcerations or rash noted.  Psych: Good eye contact, normal affect. Memory intact not anxious or depressed appearing.  CNS: CN 2-12 intact, power,  normal throughout.no focal  deficits noted.   Assessment & Plan  Encounter for examination following treatment at hospital Discharged from ED earlier this morning with negative cardiology w/u for acute onset chest pain and palpitations and negative d dimer. Based on history, still needs cardiology follow up, and will attempt to expedite sooner evaluation if possible Currently asymptomatic, advised to return to ED for recurrent symptoms   Essential hypertension Controlled, no change in medication DASH diet and commitment to daily physical activity for a minimum of 30 minutes discussed and encouraged, as a part of hypertension management. The importance of attaining a healthy weight is also discussed.  BP/Weight 07/02/2017 07/02/2017 03/11/2017 05/09/2016 01/12/2016 10/12/2015 05/10/5186  Systolic BP 416 606 301 601 093 235 573  Diastolic BP 84 93 80 82 87 96 71  Wt. (Lbs) 212 213 213.25 218 205 209 204  BMI 36.39 36.56 36.6 37.42 35.19 35.87 35       Morbid obesity (HCC) Unchanged Patient re-educated about  the importance of commitment to a  minimum of 150 minutes of exercise per week.  The importance of healthy food choices with portion control discussed.   Weight /BMI 07/02/2017 07/02/2017 03/11/2017  WEIGHT 212 lb 213 lb 213 lb 4 oz  HEIGHT 5\' 4"  - 5\' 4"   BMI 36.39 kg/m2 36.56 kg/m2 36.6 kg/m2      Hyperlipidemia LDL goal <100 Hyperlipidemia:Low fat diet discussed and encouraged.   Lipid Panel  Lab Results  Component Value Date   CHOL 215 (H) 07/04/2017   HDL 78 07/04/2017   LDLCALC 121 (H) 07/04/2017   TRIG 67 07/04/2017   CHOLHDL 2.8 07/04/2017   Needs to reduce fried and fatty foods   Vitamin D deficiency Needs to start once weekly vit d  IGT (impaired glucose tolerance) Patient educated about the importance of limiting  Carbohydrate intake , the need to commit to daily physical activity for a minimum of 30 minutes , and to commit weight loss. The fact that changes in all these areas will  reduce or eliminate all together the development of diabetes is stressed.   Diabetic Labs Latest Ref Rng & Units 07/04/2017 07/02/2017 10/12/2015 10/21/2013 08/05/2013  HbA1c <5.7 % - - - 5.6 -  Chol <200 mg/dL 215(H) - 189 183 -  HDL >50 mg/dL 78 - 69 65 -  Calc LDL mg/dL (calc) 121(H) - 104 104(H) -  Triglycerides <150 mg/dL 67 - 82 71 -  Creatinine 0.44 - 1.00 mg/dL - 0.88 0.87 0.82 0.77   BP/Weight 07/02/2017 07/02/2017 03/11/2017 05/09/2016 01/12/2016 10/12/2015 2/39/5320  Systolic BP 233 435 686 168 372 902 111  Diastolic BP 84 93 80 82 87 96 71  Wt. (Lbs) 212 213 213.25 218 205 209 204  BMI 36.39 36.56 36.6 37.42 35.19 35.87 35   No flowsheet data found.    Neck mass Left neck mass increasing in size, needs imaging

## 2017-07-02 NOTE — ED Notes (Signed)
Patient ambulatory to restroom  ?

## 2017-07-02 NOTE — ED Provider Notes (Signed)
[facility] EMERGENCY DEPARTMENT Provider Note   CSN: [id number] Arrival date & time: 07/02/17  0530     History   Chief Complaint Chief Complaint  Patient presents with  . Chest Pain    HPI Bridget Roberts is a 52 y.o. female.  The history is provided by the patient.  She has history of asthma and hypertension and comes in with heavy feeling in her chest since about 2 AM.  She states that she woke up several times between 2 AM and 3 AM with a sense that her heart was racing.  Heart racing is no longer there, but she has a heavy feeling in her chest and some shortness of breath like she just had an asthma attack.  She denies nausea, vomiting, diaphoresis.  She has not had similar symptoms before.  Nothing makes it better, nothing makes it worse.  She is a non-smoker with no history of diabetes or hyperlipidemia.  There is a family history of premature coronary atherosclerosis (mother died of a heart attack at age 87).  She denies any recent travel or surgery, denies history of DVT or PE, denies use of exogenous estrogens.  Past Medical History:  Diagnosis Date  . Asthma   . Back pain   . GERD (gastroesophageal reflux disease)   . Headache(784.0)   . Migraines   . PONV (postoperative nausea and vomiting)     Patient Active Problem List   Diagnosis Date Noted  . Essential hypertension 10/12/2015  . Need for prophylactic vaccination and inoculation against influenza 10/24/2013  . 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 Migraine headache 09/04/2012  . IGT (impaired glucose tolerance) 05/11/2012  . Morbid obesity ([place]) 06/01/2007    Past Surgical History:  Procedure Laterality Date  . ECTOPIC PREGNANCY SURGERY    . ENDOMETRIAL ABLATION    . POLYPECTOMY     vocal chords  . SALPINGECTOMY  right   unilateral  . VAGINAL HYSTERECTOMY N/A 08/04/2013   Procedure: HYSTERECTOMY VAGINAL;  Surgeon: Florian Buff, MD;  Location: [facility];  Service: Gynecology;  Laterality: N/A;  . vocal chord  polyp       OB History   None      Home Medications    Prior to Admission medications   Medication Sig Start Date End Date Taking? Authorizing Provider  amLODipine (NORVASC) 5 MG tablet Take 1 tablet (5 mg total) by mouth daily. 02/17/17   Fayrene Helper, MD  naproxen sodium (ANAPROX) 220 MG tablet Take 220 mg by mouth 2 (two) times daily with a meal.    [provider]  PROVENTIL HFA 108 (90 Base) MCG/ACT inhaler INHALE 2 PUFFS INTO THE LUNGS EVERY 6 (SIX) HOURS AS NEEDED FOR WHEEZING OR SHORTNESS OF BREATH. 01/25/16   Fayrene Helper, MD  SUMAtriptan (IMITREX) 100 MG tablet One tablet with headache onset.May repeat in 2 hours if headache persists or recurs. Maximum of 2 tablets in 24 hours Maximum use is twice weekly. 10/12/15   Fayrene Helper, MD    Family History Family History  Problem Relation Age of Onset  . Prostate cancer Father   . Heart attack Mother   . Hypertension Sister   . Diabetes Sister   . Hypertension Brother   . Hypertension Sister   . Hypertension Brother   . Hypertension Brother     Social History Social History   Tobacco Use  . Smoking status: Never Smoker  . Smokeless tobacco: Never Used  Substance Use  Topics  . Alcohol use: No  . Drug use: No     Allergies   Penicillins   Review of Systems Review of Systems  All other systems reviewed and are negative.    Physical Exam Updated Vital Signs BP (!) 147/91   Pulse 71   Temp 97.6 F (36.4 C) (Oral)   Resp 16   Wt 96.6 kg (213 lb)   LMP 01/29/2013   SpO2 100%   BMI 36.56 kg/m   Physical Exam  Nursing note and vitals reviewed.  52 year old female, resting comfortably and in no acute distress. Vital signs are significant for borderline elevated blood pressure. Oxygen saturation is 100%, which is normal. Head is normocephalic and atraumatic. PERRLA, EOMI. Oropharynx is clear. Neck is nontender and supple without adenopathy or JVD. Back is nontender and  there is no CVA tenderness. Lungs are clear without rales, wheezes, or rhonchi. Chest is nontender. Heart has regular rate and rhythm without murmur. Abdomen is soft, flat, nontender without masses or hepatosplenomegaly and peristalsis is normoactive. Extremities have no cyanosis or edema, full range of motion is present. Skin is warm and dry without rash. Neurologic: Mental status is normal, cranial nerves are intact, there are no motor or sensory deficits.  ED Treatments / Results  Labs (all labs ordered are listed, but only abnormal results are displayed) Labs Reviewed  BASIC METABOLIC PANEL - Abnormal; Notable for the following components:      Result Value   Glucose, Bld 115 (*)    All other components within normal limits  CBC WITH DIFFERENTIAL/PLATELET  TROPONIN I  D-DIMER, QUANTITATIVE (NOT AT [facility])  TROPONIN I    EKG EKG Interpretation  Date/Time:  Wednesday Jul 02 2017 05:47:02 EDT Ventricular Rate:  67 PR Interval:    QRS Duration: 92 QT Interval:  419 QTC Calculation: 443 R Axis:   57 Text Interpretation:  Sinus rhythm Normal ECG No old tracing to compare Confirmed by Delora Fuel ([id number]) on 07/02/2017 5:58:11 AM   Radiology Dg Chest 2 View  Result Date: 07/02/2017 CLINICAL DATA:  Tachycardia beginning at 0200 hours. Feeling unwell. EXAM: CHEST - 2 VIEW COMPARISON:  Chest radiograph November 05, 2014 FINDINGS: Cardiomediastinal silhouette is normal. No pleural effusions or focal consolidations. Trachea projects midline and there is no pneumothorax. Soft tissue planes and included osseous structures are non-suspicious. IMPRESSION: Normal chest radiographs. Electronically Signed   By: Elon Alas M.D.   On: 07/02/2017 06:49    Procedures Procedures  Medications Ordered in ED Medications  nitroGLYCERIN (NITROSTAT) SL tablet 0.4 mg (0.4 mg Sublingual Given 07/02/17 0547)  aspirin chewable tablet 324 mg (324 mg Oral Given 07/02/17 0547)     Initial  Impression / Assessment and Plan / ED Course  I have reviewed the triage vital signs and the nursing notes.  Pertinent labs & imaging results that were available during my care of the patient were reviewed by me and considered in my medical decision making (see chart for details).  Chest discomfort of uncertain cause.  Old records are reviewed, and she has no relevant past visits.  No risk factors for pulmonary embolism.  Heart score is 2, which puts her at low risk for major adverse cardiac events in the next 30 days.  Will give aspirin and give a therapeutic trial of nitroglycerin.  Screening labs are obtained.  Symptoms are completely resolved with above-noted treatment.  ECG is normal, chest x-ray normal, initial troponin normal, d-dimer normal.  Will  keep in the ED for delta troponin.  Case is signed out to Dr. Thurnell Garbe.  Final Clinical Impressions(s) / ED Diagnoses   Final diagnoses:  Chest discomfort    ED Discharge Orders    None       Delora Fuel, MD 78/97/84 [phone]

## 2017-07-03 ENCOUNTER — Telehealth: Payer: Self-pay | Admitting: Family Medicine

## 2017-07-03 NOTE — Telephone Encounter (Signed)
Pt is coming by to get a Dr note for 5-22 & 5-23  If you can provide

## 2017-07-04 ENCOUNTER — Ambulatory Visit (HOSPITAL_COMMUNITY): Payer: Self-pay

## 2017-07-04 DIAGNOSIS — Z13220 Encounter for screening for lipoid disorders: Secondary | ICD-10-CM | POA: Diagnosis not present

## 2017-07-04 DIAGNOSIS — I10 Essential (primary) hypertension: Secondary | ICD-10-CM | POA: Diagnosis not present

## 2017-07-04 NOTE — Telephone Encounter (Signed)
Note done

## 2017-07-05 LAB — VITAMIN D 25 HYDROXY (VIT D DEFICIENCY, FRACTURES): VIT D 25 HYDROXY: 20 ng/mL — AB (ref 30–100)

## 2017-07-05 LAB — LIPID PANEL
CHOLESTEROL: 215 mg/dL — AB (ref <200)
HDL: 78 mg/dL (ref >50)
LDL Cholesterol (Calc): 121 mg/dL (calc) — ABNORMAL HIGH
NON-HDL CHOLESTEROL (CALC): 137 mg/dL — AB (ref <130)
TRIGLYCERIDES: 67 mg/dL (ref <150)
Total CHOL/HDL Ratio: 2.8 (calc) (ref <5.0)

## 2017-07-05 LAB — TSH: TSH: 1.89 mIU/L

## 2017-07-11 ENCOUNTER — Encounter (INDEPENDENT_AMBULATORY_CARE_PROVIDER_SITE_OTHER): Payer: Self-pay | Admitting: *Deleted

## 2017-07-11 ENCOUNTER — Telehealth: Payer: Self-pay | Admitting: Family Medicine

## 2017-07-11 ENCOUNTER — Ambulatory Visit (HOSPITAL_COMMUNITY): Payer: 59

## 2017-07-11 ENCOUNTER — Other Ambulatory Visit: Payer: Self-pay | Admitting: Family Medicine

## 2017-07-11 ENCOUNTER — Ambulatory Visit (HOSPITAL_COMMUNITY): Payer: Self-pay

## 2017-07-11 DIAGNOSIS — Z1211 Encounter for screening for malignant neoplasm of colon: Secondary | ICD-10-CM

## 2017-07-11 MED ORDER — ERGOCALCIFEROL 1.25 MG (50000 UT) PO CAPS: 50000.0000 [IU] | ORAL_CAPSULE | ORAL | 1 refills | Status: DC

## 2017-07-11 NOTE — Assessment & Plan Note (Signed)
Controlled, no change in medication DASH diet and commitment to daily physical activity for a minimum of 30 minutes discussed and encouraged, as a part of hypertension management. The importance of attaining a healthy weight is also discussed.  BP/Weight 07/02/2017 07/02/2017 03/11/2017 05/09/2016 01/12/2016 10/12/2015 07/30/120  Systolic BP 241 146 431 427 670 110 034  Diastolic BP 84 93 80 82 87 96 71  Wt. (Lbs) 212 213 213.25 218 205 209 204  BMI 36.39 36.56 36.6 37.42 35.19 35.87 35

## 2017-07-11 NOTE — Telephone Encounter (Signed)
Needs screening colonoscopy, past due and she now wants it

## 2017-07-11 NOTE — Assessment & Plan Note (Signed)
Patient educated about the importance of limiting  Carbohydrate intake , the need to commit to daily physical activity for a minimum of 30 minutes , and to commit weight loss. The fact that changes in all these areas will reduce or eliminate all together the development of diabetes is stressed.   Diabetic Labs Latest Ref Rng & Units 07/04/2017 07/02/2017 10/12/2015 10/21/2013 08/05/2013  HbA1c <5.7 % - - - 5.6 -  Chol <200 mg/dL 215(H) - 189 183 -  HDL >50 mg/dL 78 - 69 65 -  Calc LDL mg/dL (calc) 121(H) - 104 104(H) -  Triglycerides <150 mg/dL 67 - 82 71 -  Creatinine 0.44 - 1.00 mg/dL - 0.88 0.87 0.82 0.77   BP/Weight 07/02/2017 07/02/2017 03/11/2017 05/09/2016 01/12/2016 10/12/2015 08/10/5282  Systolic BP 132 440 102 725 366 440 347  Diastolic BP 84 93 80 82 87 96 71  Wt. (Lbs) 212 213 213.25 218 205 209 204  BMI 36.39 36.56 36.6 37.42 35.19 35.87 35   No flowsheet data found.

## 2017-07-11 NOTE — Assessment & Plan Note (Addendum)
Discharged from ED earlier this morning with negative cardiology w/u for acute onset chest pain and palpitations and negative d dimer. Based on history, still needs cardiology follow up, and will attempt to expedite sooner evaluation if possible Currently asymptomatic, advised to return to ED for recurrent symptoms

## 2017-07-11 NOTE — Assessment & Plan Note (Signed)
Left neck mass increasing in size, needs imaging

## 2017-07-11 NOTE — Assessment & Plan Note (Signed)
Hyperlipidemia:Low fat diet discussed and encouraged.   Lipid Panel  Lab Results  Component Value Date   CHOL 215 (H) 07/04/2017   HDL 78 07/04/2017   LDLCALC 121 (H) 07/04/2017   TRIG 67 07/04/2017   CHOLHDL 2.8 07/04/2017   Needs to reduce fried and fatty foods

## 2017-07-11 NOTE — Assessment & Plan Note (Signed)
Unchanged Patient re-educated about  the importance of commitment to a  minimum of 150 minutes of exercise per week.  The importance of healthy food choices with portion control discussed.   Weight /BMI 07/02/2017 07/02/2017 03/11/2017  WEIGHT 212 lb 213 lb 213 lb 4 oz  HEIGHT 5\' 4"  - 5\' 4"   BMI 36.39 kg/m2 36.56 kg/m2 36.6 kg/m2

## 2017-07-11 NOTE — Assessment & Plan Note (Signed)
Needs to start once weekly vit d

## 2017-07-18 ENCOUNTER — Ambulatory Visit (HOSPITAL_COMMUNITY): Payer: 59

## 2017-08-08 ENCOUNTER — Encounter: Payer: Self-pay | Admitting: Cardiovascular Disease

## 2017-08-08 ENCOUNTER — Ambulatory Visit: Payer: 59 | Admitting: Cardiovascular Disease

## 2017-08-08 VITALS — BP 149/86 | HR 68 | Wt 213.0 lb

## 2017-08-08 DIAGNOSIS — Z9289 Personal history of other medical treatment: Secondary | ICD-10-CM | POA: Diagnosis not present

## 2017-08-08 DIAGNOSIS — I10 Essential (primary) hypertension: Secondary | ICD-10-CM

## 2017-08-08 DIAGNOSIS — E7800 Pure hypercholesterolemia, unspecified: Secondary | ICD-10-CM | POA: Diagnosis not present

## 2017-08-08 DIAGNOSIS — H5203 Hypermetropia, bilateral: Secondary | ICD-10-CM | POA: Diagnosis not present

## 2017-08-08 DIAGNOSIS — R079 Chest pain, unspecified: Secondary | ICD-10-CM

## 2017-08-08 DIAGNOSIS — H524 Presbyopia: Secondary | ICD-10-CM | POA: Diagnosis not present

## 2017-08-08 DIAGNOSIS — H52201 Unspecified astigmatism, right eye: Secondary | ICD-10-CM | POA: Diagnosis not present

## 2017-08-08 NOTE — Patient Instructions (Signed)
Your physician recommends that you schedule a follow-up appointment in: next available apt with Holly      Your physician has requested that you have an exercise tolerance test. For further information please visit [email or web address]. Please also follow instruction sheet, as given.     Your physician recommends that you continue on your current medications as directed. Please refer to the Current Medication list given to you today.    If you need a refill on your cardiac medications before your next appointment, please call your pharmacy.     No lab work today.      Thank you for choosing [facility] !

## 2017-08-08 NOTE — Progress Notes (Signed)
CARDIOLOGY CONSULT NOTE  Patient ID: [name] MRN: [id number] DOB/AGE: Sep 26, 1965 52 y.o.  Admit date: (Not on file) Primary Physician: Fayrene Helper, MD Referring Physician: Dr. Roxanne Mins  Reason for Consultation: Chest pain  HPI: Bridget Roberts is a 52 y.o. female who is being seen today for the evaluation of chest pain at the request of Delora Fuel, MD.  Past medical history includes hypertension.  She was evaluated in the ED for chest pain on 07/02/2017.  I reviewed all relevant documentation, labs, and studies.  She was given aspirin and nitroglycerin with symptom resolution.  ECG, chest x-ray, troponins, CBC, renal function, and d-dimer were normal.  I personally reviewed the ECG performed on 07/02/2017 which demonstrated normal sinus rhythm with no ischemic ST segment or T wave abnormalities, nor any arrhythmias.  On the day of the ED presentation, she was awoken by retrosternal chest pain between 2 and 3 AM.  She said it felt similar to a panic attack she experienced 2 years ago.  At that time she was driving up to the [place] with a friend and had a fear of heights.  She denies exertional chest pain.  She describes weight gain and some shortness of breath related to that.  She denies palpitations, leg swelling, orthopnea, lightheadedness, dizziness, syncope, and paroxysmal nocturnal dyspnea.  She recently returned from [facility] where she did quite a bit of walking.  She also exercises during work breaks and does some routine fasting.  I reviewed lipids dated 07/04/2017: Total cholesterol 215, HDL 78, triglycerides 67, LDL 121.  Family history: Her mother died of an MI at the age of 52.  She was a smoker.   Allergies  Allergen Reactions  . Penicillins Rash    Current Outpatient Medications  Medication Sig Dispense Refill  . amLODipine (NORVASC) 5 MG tablet Take 1 tablet (5 mg total) by mouth daily. 30 tablet 3  . PROVENTIL HFA 108 (90 Base)  MCG/ACT inhaler INHALE 2 PUFFS INTO THE LUNGS EVERY 6 (SIX) HOURS AS NEEDED FOR WHEEZING OR SHORTNESS OF BREATH. 18 g 2  . SUMAtriptan (IMITREX) 100 MG tablet One tablet with headache onset.May repeat in 2 hours if headache persists or recurs. Maximum of 2 tablets in 24 hours Maximum use is twice weekly. 10 tablet 1   No current facility-administered medications for this visit.     Past Medical History:  Diagnosis Date  . Asthma   . Back pain   . GERD (gastroesophageal reflux disease)   . Headache(784.0)   . Hypertension   . Migraines   . PONV (postoperative nausea and vomiting)     Past Surgical History:  Procedure Laterality Date  . ECTOPIC PREGNANCY SURGERY    . ENDOMETRIAL ABLATION    . POLYPECTOMY     vocal chords  . SALPINGECTOMY  right   unilateral  . VAGINAL HYSTERECTOMY N/A 08/04/2013   Procedure: HYSTERECTOMY VAGINAL;  Surgeon: Florian Buff, MD;  Location: [facility];  Service: Gynecology;  Laterality: N/A;  . vocal chord polyp      Social History   Socioeconomic History  . Marital status: Single    Spouse name: Not on file  . Number of children: Not on file  . Years of education: Not on file  . Highest education level: Not on file  Occupational History  . Not on file  Social Needs  . Financial resource strain: Not on file  . Food insecurity:  Worry: Not on file    Inability: Not on file  . Transportation needs:    Medical: Not on file    Non-medical: Not on file  Tobacco Use  . Smoking status: Never Smoker  . Smokeless tobacco: Never Used  Substance and Sexual Activity  . Alcohol use: No  . Drug use: No  . Sexual activity: Not Currently    Birth control/protection: Surgical  Lifestyle  . Physical activity:    Days per week: Not on file    Minutes per session: Not on file  . Stress: Not on file  Relationships  . Social connections:    Talks on phone: Not on file    Gets together: Not on file    Attends religious service: Not on file     Active member of club or organization: Not on file    Attends meetings of clubs or organizations: Not on file    Relationship status: Not on file  . Intimate partner violence:    Fear of current or ex partner: Not on file    Emotionally abused: Not on file    Physically abused: Not on file    Forced sexual activity: Not on file  Other Topics Concern  . Not on file  Social History Narrative  . Not on file      Current Meds  Medication Sig  . amLODipine (NORVASC) 5 MG tablet Take 1 tablet (5 mg total) by mouth daily.  Marland Kitchen PROVENTIL HFA 108 (90 Base) MCG/ACT inhaler INHALE 2 PUFFS INTO THE LUNGS EVERY 6 (SIX) HOURS AS NEEDED FOR WHEEZING OR SHORTNESS OF BREATH.  . SUMAtriptan (IMITREX) 100 MG tablet One tablet with headache onset.May repeat in 2 hours if headache persists or recurs. Maximum of 2 tablets in 24 hours Maximum use is twice weekly.      Review of systems complete and found to be negative unless listed above in HPI    Physical exam Blood pressure (!) 149/86, pulse 68, weight 213 lb (96.6 kg), last menstrual period 01/29/2013, SpO2 98 %. General: NAD Neck: No JVD, no thyromegaly or thyroid nodule.  Lungs: Clear to auscultation bilaterally with normal respiratory effort. CV: Nondisplaced PMI. Regular rate and rhythm, normal S1/S2, no S3/S4, no murmur.  No peripheral edema.  No carotid bruit.    Abdomen: Soft, nontender, no distention.  Skin: Intact without lesions or rashes.  Neurologic: Alert and oriented x 3.  Psych: Normal affect. Extremities: No clubbing or cyanosis.  HEENT: Normal.   ECG: Most recent ECG reviewed.   Labs: Lab Results  Component Value Date/Time   K 3.7 07/02/2017 05:52 AM   BUN 14 07/02/2017 05:52 AM   CREATININE 0.88 07/02/2017 05:52 AM   CREATININE 0.87 10/12/2015 07:18 AM   ALT 9 10/12/2015 07:18 AM   TSH 1.89 07/04/2017 10:49 AM   HGB 13.8 07/02/2017 05:52 AM     Lipids: Lab Results  Component Value Date/Time   LDLCALC 121 (H)  07/04/2017 10:49 AM   CHOL 215 (H) 07/04/2017 10:49 AM   TRIG 67 07/04/2017 10:49 AM   HDL 78 07/04/2017 10:49 AM        ASSESSMENT AND PLAN:   1.  Chest pain: Unclear etiology at this time.  Risk factors include hypertension and hypercholesterolemia.  Her mother died of sudden MI but she was a smoker and the patient does not smoke. I will obtain an exercise tolerance test for further assessment.  2.  Hypertension: Blood pressure is elevated.  She is on amlodipine 5 mg.  This will need further monitoring.  3.  Hypercholesterolemia: Lipids reviewed above.  No indication for medical therapy at this time.  She needs lifestyle modification which she is pursuing.   Disposition: Follow up with me within the next 2 to 3 months.  Signed: Kate Sable, M.D., F.A.C.C.  08/08/2017, 9:04 AM

## 2017-08-15 ENCOUNTER — Ambulatory Visit (HOSPITAL_COMMUNITY)
Admission: RE | Admit: 2017-08-15 | Discharge: 2017-08-15 | Disposition: A | Discharge status: Home / Self Care | Payer: 59 | Source: Ambulatory Visit | Attending: Cardiovascular Disease | Admitting: Cardiovascular Disease

## 2017-08-15 DIAGNOSIS — R079 Chest pain, unspecified: Secondary | ICD-10-CM

## 2017-08-15 LAB — EXERCISE TOLERANCE TEST
CSEPED: 7 min
CSEPEDS: 50 s
CSEPEW: 10.1 METS
MPHR: 166 {beats}/min
Peak HR: 155 {beats}/min
Percent HR: 93 %
RPE: 17
Rest HR: 59 {beats}/min

## 2017-08-18 ENCOUNTER — Telehealth: Payer: Self-pay | Admitting: *Deleted

## 2017-08-18 NOTE — Telephone Encounter (Signed)
-----   Message from Herminio Commons, MD sent at 08/15/2017  3:20 PM EDT ----- Normal

## 2017-08-18 NOTE — Telephone Encounter (Signed)
Called patient with test results. No answer. Left message to call back.  

## 2017-10-08 ENCOUNTER — Encounter: Payer: Self-pay | Admitting: Family Medicine

## 2017-10-09 ENCOUNTER — Telehealth: Payer: Self-pay | Admitting: Family Medicine

## 2017-10-09 NOTE — Telephone Encounter (Signed)
Called and [facility] regarding missed appt 8/28.  Requested pt to call and re-schedule

## 2017-10-20 ENCOUNTER — Telehealth: Payer: Self-pay | Admitting: Family Medicine

## 2017-10-20 NOTE — Telephone Encounter (Signed)
FMLA  Copied, noted, sleeved

## 2017-10-28 ENCOUNTER — Encounter (HOSPITAL_COMMUNITY): Payer: Self-pay | Admitting: Physical Therapy

## 2017-10-28 NOTE — Therapy (Signed)
[place], [place], [address] Phone: [phone]   Fax:  [phone]  Patient Details  Name: Bridget Roberts MRN: [id number] Date of Birth: 12/14/1965 Referring Provider:  No ref. provider found  Encounter Date: 10/28/2017  PHYSICAL THERAPY DISCHARGE SUMMARY  Visits from Start of Care: 2  Current functional level related to goals / functional outcomes: Has not returned to PT- DC    Remaining deficits: Unable to assess   Education / Equipment: None  Plan: Patient agrees to discharge.  Patient goals were not met. Patient is being discharged due to not returning since the last visit.  ?????       [name] PT, DPT, CBIS  Supplemental Physical Therapist [facility]    Pager [phone] Acute Rehab Office [place] [address] [place], [place], [address] Phone: [phone]   Fax:  [phone]

## 2017-10-30 ENCOUNTER — Telehealth: Payer: Self-pay | Admitting: Family Medicine

## 2017-10-30 NOTE — Telephone Encounter (Signed)
pls copy completed and signed FMLA and pass forward

## 2017-11-04 DIAGNOSIS — J45909 Unspecified asthma, uncomplicated: Secondary | ICD-10-CM

## 2017-11-04 DIAGNOSIS — G43909 Migraine, unspecified, not intractable, without status migrainosus: Secondary | ICD-10-CM

## 2017-11-04 DIAGNOSIS — I10 Essential (primary) hypertension: Secondary | ICD-10-CM

## 2017-11-04 NOTE — Telephone Encounter (Signed)
completed

## 2017-11-12 ENCOUNTER — Ambulatory Visit: Payer: 59 | Admitting: Cardiovascular Disease

## 2017-12-04 ENCOUNTER — Encounter: Payer: Self-pay | Admitting: Family Medicine

## 2017-12-04 ENCOUNTER — Ambulatory Visit (INDEPENDENT_AMBULATORY_CARE_PROVIDER_SITE_OTHER): Payer: 59 | Admitting: Family Medicine

## 2017-12-04 VITALS — BP 142/90 | HR 79 | Resp 12 | Ht 64.0 in | Wt 205.0 lb

## 2017-12-04 DIAGNOSIS — R221 Localized swelling, mass and lump, neck: Secondary | ICD-10-CM

## 2017-12-04 DIAGNOSIS — Z0000 Encounter for general adult medical examination without abnormal findings: Secondary | ICD-10-CM | POA: Diagnosis not present

## 2017-12-04 DIAGNOSIS — E785 Hyperlipidemia, unspecified: Secondary | ICD-10-CM

## 2017-12-04 DIAGNOSIS — J45909 Unspecified asthma, uncomplicated: Secondary | ICD-10-CM

## 2017-12-04 DIAGNOSIS — I10 Essential (primary) hypertension: Secondary | ICD-10-CM

## 2017-12-04 DIAGNOSIS — G43119 Migraine with aura, intractable, without status migrainosus: Secondary | ICD-10-CM

## 2017-12-04 DIAGNOSIS — E559 Vitamin D deficiency, unspecified: Secondary | ICD-10-CM

## 2017-12-04 MED ORDER — SUMATRIPTAN SUCCINATE 100 MG PO TABS: ORAL_TABLET | ORAL | 0 refills | Status: DC

## 2017-12-04 MED ORDER — AMLODIPINE BESYLATE 5 MG PO TABS: 5.0000 mg | ORAL_TABLET | Freq: Every day | ORAL | 3 refills | Status: DC

## 2017-12-04 MED ORDER — ALBUTEROL SULFATE HFA 108 (90 BASE) MCG/ACT IN AERS: 2.0000 | INHALATION_SPRAY | Freq: Four times a day (QID) | RESPIRATORY_TRACT | 0 refills | Status: DC | PRN

## 2017-12-04 NOTE — Patient Instructions (Addendum)
F/U  IN 6 MONTHS, CALL IF YOU NEED ME SOONER FASTING CBC, LIPIID AND EGFR AND VIT D IN 5.5 MONTHS, PLEASE LEAVE WITH LAB ORDER  BLOOD PRESSURE ELEVATED AT TODAY'S VISIT BUT WILL LET CARDIOLOGY RE EVALUATE  LEG PAIN WILL REDUCE WITH WEIGHT LOSS AND REGULAR ACTIVITY DESPITE THE FACT THAT YOU HAVE ARTHRITIS AND DISC DISEASE   YOU WILL HAVE THE [place] OF YOUR LEFT NECK TO EVALUATE SWELLING/LUMP  SCHEDULED LATEST IN PM  MAMMOGRAM SCHEDULED AT CHECKOUT LATEST IN PM  CONGRATS ON HEAL TY LIFESTYLE COMMITMENT 8 POUND WEIGHT LOSS SO FAR  It is important that you exercise regularly at least 30 minutes 5 times a week. If you develop chest pain, have severe difficulty breathing, or feel very tired, stop exercising immediately and seek medical attention   Thanks for choosing [facility], we consider it a privelige to serve you.

## 2017-12-04 NOTE — Assessment & Plan Note (Addendum)
Headaches every 2 to 3 months, responds to immitrex, medication prescribed

## 2017-12-04 NOTE — Assessment & Plan Note (Addendum)
Un Controlled, no change in medication, will defer to cardiology who she sees in the next 2 months DASH diet and commitment to daily physical activity for a minimum of 30 minutes discussed and encouraged, as a part of hypertension management. The importance of attaining a healthy weight is also discussed.  BP/Weight 12/04/2017 08/08/2017 07/02/2017 07/02/2017 03/11/2017 05/09/2016 14/04/8885  Systolic BP 579 728 206 015 615 379 432  Diastolic BP 80 86 84 93 80 82 87  Wt. (Lbs) 205.04 213 212 213 213.25 218 205  BMI 35.2 36.56 36.39 36.56 36.6 37.42 35.19

## 2017-12-05 ENCOUNTER — Other Ambulatory Visit: Payer: Self-pay

## 2017-12-05 MED ORDER — SUMATRIPTAN SUCCINATE 100 MG PO TABS: ORAL_TABLET | ORAL | 0 refills | Status: DC

## 2017-12-06 ENCOUNTER — Encounter: Payer: Self-pay | Admitting: Family Medicine

## 2017-12-06 ENCOUNTER — Telehealth: Payer: Self-pay | Admitting: Family Medicine

## 2017-12-06 DIAGNOSIS — Z0000 Encounter for general adult medical examination without abnormal findings: Secondary | ICD-10-CM | POA: Insufficient documentation

## 2017-12-06 NOTE — Progress Notes (Signed)
Bridget Roberts     MRN: [id number]      DOB: 12-07-1965  HPI: Patient is in for annual physical exam. C/o intermittent left hip and groin pain , from her chronic arthritis and disc disease in her low back Requests refill on her albuterol inhaler in the event of wheezing, generally does not  C/o swelling on left neck above collar bone , still wants to have thus checked with imaging, denies pain, just not going away  Immunization is reviewed , and  She refuses the flu vaccine, will look at  shingrix hihly motivated as far as healthy food choice with weight loss , also intends to be more diligent with exercise   PE: BP (!) 142/90   Pulse 79   Resp 12   Ht 5\' 4"  (1.626 m)   Wt 205 lb 0.6 oz (93 kg)   LMP 01/29/2013   SpO2 94% Comment: room air  BMI 35.20 kg/m   Pleasant  female, alert and oriented x 3, in no cardio-pulmonary distress. Afebrile. HEENT No facial trauma or asymetry. Sinuses non tender.  Extra occullar muscles intact, External ears normal, tympanic membranes clear. Oropharynx moist, no exudate. Neck: supple, no adenopathy,JVD or thyromegaly.No bruits. Swelling/ fullness/mass in left supraclavicular area, non tender, no erythema, subcutaneous  Chest: Clear to ascultation bilaterally.No crackles or wheezes. Non tender to palpation  Breast: No exam done per pt request, mammogram past due Cardiovascular system; Heart sounds normal,  S1 and  S2 ,no S3.  No murmur, or thrill. Apical beat not displaced Peripheral pulses normal.  Abdomen: Soft, non tender, no organomegaly or masses. No bruits. Bowel sounds normal. No guarding, tenderness or rebound.    GU:  Asymptomatic , no exam indicated   Musculoskeletal exam: Full ROM of spine, hips , shoulders and knees. No deformity ,swelling or crepitus noted. No muscle wasting or atrophy.   Neurologic: Cranial nerves 2 to 12 intact. Power, tone , normal throughout. No disturbance in gait. No  tremor.  Skin: Intact, no ulceration, erythema , scaling or rash noted. Pigmentation normal throughout  Psych; Normal mood and affect. Judgement and concentration normal   Assessment & Plan:  Migraine headache Headaches every 2 to 3 months, responds to immitrex, medication prescribed  Essential hypertension Un Controlled, no change in medication, will defer to cardiology who she sees in the next 2 months DASH diet and commitment to daily physical activity for a minimum of 30 minutes discussed and encouraged, as a part of hypertension management. The importance of attaining a healthy weight is also discussed.  BP/Weight 12/04/2017 08/08/2017 07/02/2017 07/02/2017 03/11/2017 05/09/2016 29/06/2839  Systolic BP 324 401 027 253 664 403 474  Diastolic BP 80 86 84 93 80 82 87  Wt. (Lbs) 205.04 213 212 213 213.25 218 205  BMI 35.2 36.56 36.39 36.56 36.6 37.42 35.19        Annual physical exam Annual exam as documented. Refuses flu vaccine Counseling done  re healthy lifestyle involving commitment to 150 minutes exercise per week, heart healthy diet, and attaining healthy weight.The importance of adequate sleep also discussed. Immunization and cancer screening needs are specifically addressed at this visit.Needs mammogram and colonoscopy   Neck mass 2 year h/o swelling/ mass above left collar bone, non tender , gradually increasing in size, imaging ordered in 06/2017, needs to be scheduled  Intrinsic asthma Infrequent need for proventil, on average 3 to 4 times / year, refill sent  Morbid obesity ([place]) Improved, pt applauded  on this and goals set for next 6 months Patient re-educated about  the importance of commitment to a  minimum of 150 minutes of exercise per week.  The importance of healthy food choices with portion control discussed. Encouraged to start a food diary, count calories and to consider  joining a support group. Sample diet sheets offered. Goals set by the patient  for the next several months.   Weight /BMI 12/04/2017 08/08/2017 07/02/2017  WEIGHT 205 lb 0.6 oz 213 lb 212 lb  HEIGHT 5\' 4"  - 5\' 4"   BMI 35.2 kg/m2 36.56 kg/m2 36.39 kg/m2

## 2017-12-06 NOTE — Assessment & Plan Note (Signed)
Improved, pt applauded on this and goals set for next 6 months Patient re-educated about  the importance of commitment to a  minimum of 150 minutes of exercise per week.  The importance of healthy food choices with portion control discussed. Encouraged to start a food diary, count calories and to consider  joining a support group. Sample diet sheets offered. Goals set by the patient for the next several months.   Weight /BMI 12/04/2017 08/08/2017 07/02/2017  WEIGHT 205 lb 0.6 oz 213 lb 212 lb  HEIGHT 5\' 4"  - 5\' 4"   BMI 35.2 kg/m2 36.56 kg/m2 36.39 kg/m2

## 2017-12-06 NOTE — Assessment & Plan Note (Addendum)
Annual exam as documented. Refuses flu vaccine Counseling done  re healthy lifestyle involving commitment to 150 minutes exercise per week, heart healthy diet, and attaining healthy weight.The importance of adequate sleep also discussed. Immunization and cancer screening needs are specifically addressed at this visit.Needs mammogram and colonoscopy

## 2017-12-06 NOTE — Assessment & Plan Note (Signed)
Infrequent need for proventil, on average 3 to 4 times / year, refill sent

## 2017-12-06 NOTE — Telephone Encounter (Signed)
Pls call or send letter advising her of need for colon cancer screening, as in colonoscopy she needs to advise which GI Doc she wants to be referred o for colonoscopy, nd then this will be done Thanks ? pls ask

## 2017-12-06 NOTE — Assessment & Plan Note (Signed)
2 year h/o swelling/ mass above left collar bone, non tender , gradually increasing in size, imaging ordered in 06/2017, needs to be scheduled

## 2017-12-08 NOTE — Telephone Encounter (Signed)
Letter sent.

## 2017-12-22 ENCOUNTER — Ambulatory Visit (HOSPITAL_COMMUNITY): Payer: Self-pay

## 2018-01-29 ENCOUNTER — Ambulatory Visit: Payer: 59 | Admitting: Cardiovascular Disease

## 2018-01-29 ENCOUNTER — Encounter: Payer: Self-pay | Admitting: Cardiovascular Disease

## 2018-01-29 VITALS — BP 140/86 | HR 61 | Ht 64.0 in | Wt 201.0 lb

## 2018-01-29 DIAGNOSIS — E7800 Pure hypercholesterolemia, unspecified: Secondary | ICD-10-CM

## 2018-01-29 DIAGNOSIS — R079 Chest pain, unspecified: Secondary | ICD-10-CM | POA: Diagnosis not present

## 2018-01-29 DIAGNOSIS — I10 Essential (primary) hypertension: Secondary | ICD-10-CM | POA: Diagnosis not present

## 2018-01-29 MED ORDER — AMLODIPINE BESYLATE 10 MG PO TABS: 10.0000 mg | ORAL_TABLET | Freq: Every day | ORAL | 3 refills | Status: DC

## 2018-01-29 MED ORDER — AMLODIPINE BESYLATE 5 MG PO TABS: 5.0000 mg | ORAL_TABLET | Freq: Every day | ORAL | 3 refills | Status: DC

## 2018-01-29 NOTE — Progress Notes (Signed)
SUBJECTIVE: The patient returns for follow-up after undergoing cardiovascular testing performed for the evaluation of chest pain.  Exercise tolerance test on 08/15/2017 showed no evidence of ischemia with a low risk Duke treadmill score.  There was a hypertensive response to exercise.  The patient denies any symptoms of chest pain, palpitations, shortness of breath, lightheadedness, dizziness, leg swelling, orthopnea, PND, and syncope.  She has changed her diet and has lost 18 pounds.  She slept 2 hours this past Monday night 4 hours on Tuesday night and 9 hours last night and thinks that is why her blood pressure might be slightly elevated today.  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  She plans to take 2 months off after finishing this job before starting her next job.  She is also going to [employer] school.      Review of Systems: As per "subjective", otherwise negative.  Allergies  Allergen Reactions  . Penicillins Rash    Current Outpatient Medications  Medication Sig Dispense Refill  . albuterol (PROVENTIL HFA;VENTOLIN HFA) 108 (90 Base) MCG/ACT inhaler Inhale 2 puffs into the lungs every 6 (six) hours as needed for wheezing or shortness of breath. 1 Inhaler 0  . amLODipine (NORVASC) 5 MG tablet Take 1 tablet (5 mg total) by mouth daily. 90 tablet 3  . SUMAtriptan (IMITREX) 100 MG tablet One tablet at headache onset May repeat in 2 hours if headache persists or recurs. Maximum  Use is twice weekly 10 tablet 0   No current facility-administered medications for this visit.     Past Medical History:  Diagnosis Date  . Asthma   . Back pain   . GERD (gastroesophageal reflux disease)   . Headache(784.0)   . Hypertension   . Migraines   . PONV (postoperative nausea and vomiting)     Past Surgical History:  Procedure Laterality Date  . ECTOPIC PREGNANCY SURGERY    . ENDOMETRIAL ABLATION      . POLYPECTOMY     vocal chords  . SALPINGECTOMY  right   unilateral  . VAGINAL HYSTERECTOMY N/A 08/04/2013   Procedure: HYSTERECTOMY VAGINAL;  Surgeon: Florian Buff, MD;  Location: [facility];  Service: Gynecology;  Laterality: N/A;  . vocal chord polyp      Social History   Socioeconomic History  . Marital status: Single    Spouse name: Not on file  . Number of children: Not on file  . Years of education: Not on file  . Highest education level: Not on file  Occupational History  . Not on file  Social Needs  . Financial resource strain: Not on file  . Food insecurity:    Worry: Not on file    Inability: Not on file  . Transportation needs:    Medical: Not on file    Non-medical: Not on file  Tobacco Use  . Smoking status: Never Smoker  . Smokeless tobacco: Never Used  Substance and Sexual Activity  . Alcohol use: No  . Drug use: No  . Sexual activity: Not Currently    Birth control/protection: Surgical  Lifestyle  . Physical activity:    Days per week: Not on file    Minutes per session: Not on file  . Stress: Not on file  Relationships  . Social connections:    Talks on phone: Not on file    Gets together: Not on file  Attends religious service: Not on file    Active member of club or organization: Not on file    Attends meetings of clubs or organizations: Not on file    Relationship status: Not on file  . Intimate partner violence:    Fear of current or ex partner: Not on file    Emotionally abused: Not on file    Physically abused: Not on file    Forced sexual activity: Not on file  Other Topics Concern  . Not on file  Social History Narrative  . Not on file     Vitals:   01/29/18 0840  BP: 140/86  Pulse: 61  SpO2: 98%  Weight: 201 lb (91.2 kg)  Height: 5\' 4"  (1.626 m)    Wt Readings from Last 3 Encounters:  01/29/18 201 lb (91.2 kg)  12/04/17 205 lb 0.6 oz (93 kg)  08/08/17 213 lb (96.6 kg)     PHYSICAL EXAM General: NAD HEENT:  Normal. Neck: No JVD, no thyromegaly. Lungs: Clear to auscultation bilaterally with normal respiratory effort. CV: Regular rate and rhythm, normal S1/S2, no S3/S4, no murmur. No pretibial or periankle edema.     Abdomen: Soft, nontender, no distention.  Neurologic: Alert and oriented.  Psych: Normal affect. Skin: Normal. Musculoskeletal: No gross deformities.    ECG: Reviewed above under Subjective   Labs: Lab Results  Component Value Date/Time   K 3.7 07/02/2017 05:52 AM   BUN 14 07/02/2017 05:52 AM   CREATININE 0.88 07/02/2017 05:52 AM   CREATININE 0.87 10/12/2015 07:18 AM   ALT 9 10/12/2015 07:18 AM   TSH 1.89 07/04/2017 10:49 AM   HGB 13.8 07/02/2017 05:52 AM     Lipids: Lab Results  Component Value Date/Time   LDLCALC 121 (H) 07/04/2017 10:49 AM   CHOL 215 (H) 07/04/2017 10:49 AM   TRIG 67 07/04/2017 10:49 AM   HDL 78 07/04/2017 10:49 AM       ASSESSMENT AND PLAN: 1.  Chest pain: No recurrences.  Low risk exercise tolerance test as detailed above with a hypertensive response to exercise.  No further testing is indicated.  2.  Hypertension: Blood pressure is mildly elevated today but she has had significant lack of sleep this week.  She is on amlodipine 5 mg.    She has lost 18 pounds by her home scale and plans to continue to lose weight through dietary and exercise modification.  I asked her to continue to check her blood pressures and her PCP can follow-up on this.  3.  Hypercholesterolemia: Lipids reviewed above.  No indication for medical therapy at this time.  She needs lifestyle modification which she is pursuing and has lost 18 pounds already.  I congratulated her on this.     Disposition: Follow up as needed   Kate Sable, M.D., F.A.C.C.

## 2018-01-29 NOTE — Patient Instructions (Addendum)
Medication Instructions:  Your physician recommends that you continue on your current medications as directed. Please refer to the Current Medication list given to you today.   Labwork: NONE  Testing/Procedures: NONE  Follow-Up: Your physician recommends that you schedule a follow-up appointment in: AS NEEDED      Any Other Special Instructions Will Be Listed Below (If Applicable).     If you need a refill on your cardiac medications before your next appointment, please call your pharmacy.   

## 2018-06-04 ENCOUNTER — Ambulatory Visit: Payer: Self-pay | Admitting: Family Medicine

## 2018-06-04 ENCOUNTER — Other Ambulatory Visit: Payer: Self-pay

## 2018-12-31 ENCOUNTER — Encounter: Payer: Self-pay | Admitting: Family Medicine

## 2018-12-31 ENCOUNTER — Ambulatory Visit (INDEPENDENT_AMBULATORY_CARE_PROVIDER_SITE_OTHER): Payer: 59 | Admitting: Family Medicine

## 2018-12-31 ENCOUNTER — Other Ambulatory Visit: Payer: Self-pay

## 2018-12-31 VITALS — BP 138/88 | HR 69 | Temp 98.2°F | Resp 15 | Ht 64.0 in | Wt 209.0 lb

## 2018-12-31 DIAGNOSIS — Z1211 Encounter for screening for malignant neoplasm of colon: Secondary | ICD-10-CM

## 2018-12-31 DIAGNOSIS — M5416 Radiculopathy, lumbar region: Secondary | ICD-10-CM

## 2018-12-31 DIAGNOSIS — E785 Hyperlipidemia, unspecified: Secondary | ICD-10-CM

## 2018-12-31 DIAGNOSIS — Z1231 Encounter for screening mammogram for malignant neoplasm of breast: Secondary | ICD-10-CM

## 2018-12-31 DIAGNOSIS — R110 Nausea: Secondary | ICD-10-CM

## 2018-12-31 DIAGNOSIS — I10 Essential (primary) hypertension: Secondary | ICD-10-CM | POA: Diagnosis not present

## 2018-12-31 DIAGNOSIS — G43901 Migraine, unspecified, not intractable, with status migrainosus: Secondary | ICD-10-CM

## 2018-12-31 DIAGNOSIS — E559 Vitamin D deficiency, unspecified: Secondary | ICD-10-CM

## 2018-12-31 MED ORDER — ONDANSETRON HCL 4 MG/2ML IJ SOLN
4.0000 mg | Freq: Once | INTRAMUSCULAR | Status: AC
  Administered 2018-12-31: 4 mg via INTRAMUSCULAR

## 2018-12-31 MED ORDER — SUMATRIPTAN SUCCINATE 100 MG PO TABS
ORAL_TABLET | ORAL | 1 refills | Status: DC
Start: 2018-12-31 — End: 2020-10-12

## 2018-12-31 MED ORDER — ONDANSETRON HCL 4 MG PO TABS: 4.0000 mg | ORAL_TABLET | Freq: Three times a day (TID) | ORAL | 0 refills | Status: DC | PRN

## 2018-12-31 MED ORDER — KETOROLAC TROMETHAMINE 60 MG/2ML IM SOLN
60.0000 mg | Freq: Once | INTRAMUSCULAR | Status: AC
  Administered 2018-12-31: 60 mg via INTRAMUSCULAR

## 2018-12-31 MED ORDER — AMLODIPINE BESYLATE 5 MG PO TABS: 5.0000 mg | ORAL_TABLET | Freq: Every day | ORAL | 3 refills | Status: DC

## 2018-12-31 MED ORDER — PREDNISONE 10 MG PO TABS: 10.0000 mg | ORAL_TABLET | Freq: Two times a day (BID) | ORAL | 0 refills | Status: DC

## 2018-12-31 MED ORDER — IBUPROFEN 800 MG PO TABS: ORAL_TABLET | ORAL | 0 refills | Status: DC

## 2018-12-31 MED ORDER — PANTOPRAZOLE SODIUM 40 MG PO TBEC: DELAYED_RELEASE_TABLET | ORAL | 0 refills | Status: DC

## 2018-12-31 MED ORDER — METHYLPREDNISOLONE ACETATE 80 MG/ML IJ SUSP
80.0000 mg | Freq: Once | INTRAMUSCULAR | Status: AC
  Administered 2018-12-31: 80 mg via INTRAMUSCULAR

## 2018-12-31 NOTE — Progress Notes (Signed)
Bridget Roberts     MRN: [id number]      DOB: 11-06-1965   HPI Ms. Bridget Roberts is here for follow up and re-evaluation of chronic medical conditions, medication management and review of any available recent lab and radiology data.  4 day h/o disabling, generalized bur worse on rigth side of face and neck , like a typical migraine , throbbing currently a 4, early in the week a 10. Did go to work on Monday with the headache but had to leave, has not been back since Has been taking 1 immitrex daily, all finished, yesterday excedrin migraine  Has nausea, phono and  Photophobia. This is her first disabling migraine in the past 12 months. Has vomited once earlier this week Back pain at end of work day past a 80, with LLE weakness and numbness, should have had a scan last year but was unable to follow through ROS Denies recent fever or chills. Denies sinus pressure, nasal congestion, ear pain or sore throat. Denies chest congestion, productive cough or wheezing. Denies chest pains, palpitations and leg swelling Denies abdominal pain, nausea, vomiting,diarrhea or constipation.   Denies dysuria, frequency, hesitancy or incontinence.  Denies seizures, numbness, or tingling. Denies depression, anxiety or insomnia. Denies skin break down or rash.   PE  BP 138/88   Pulse 69   Temp 98.2 F (36.8 C)   Resp 15   Ht 5\' 4"  (1.626 m)   Wt 209 lb (94.8 kg)   LMP 01/29/2013   SpO2 98%   BMI 35.87 kg/m   Patient alert and oriented and in no cardiopulmonary distress.Pt in pain  HEENT: No facial asymmetry, EOMI,     Neck supple .  Chest: Clear to auscultation bilaterally.  CVS: S1, S2 no murmurs, no S3.Regular rate.  ABD: Soft non tender.   Ext: No edema  [id number]  ROM lumbar  spine, adequate in shoulders, hips and knees.  Skin: Intact, no ulcerations or rash noted.  Psych: Good eye contact, normal affect. Memory intact not anxious or depressed appearing.  CNS: CN 2-12 intact, grade  4 power  In LLE and reduced sensation in LLE  Assessment & Plan  Migraine headache Disabling headache 4 days, slight improvement. Had only 2 immitrex and I have re educated her as to appropriate dosing to abort headache sooner as well as combining with ibuprofen. Work excuse for 1 week, stil has lingering headache  Essential hypertension Controlled, no change in medication DASH diet and commitment to daily physical activity for a minimum of 30 minutes discussed and encouraged, as a part of hypertension management. The importance of attaining a healthy weight is also discussed.  BP/Weight 12/31/2018 01/29/2018 12/04/2017 08/08/2017 07/02/2017 07/02/2017 0000000  Systolic BP 0000000 [id number] [id number]  Diastolic BP 88 86 90 86 84 93 80  Wt. (Lbs) 209 201 205.04 213 212 213 213.25  BMI 35.87 34.5 35.2 36.56 36.39 36.56 36.6       Morbid obesity ([facility]) Obesity linked with hypertension and back pain  Patient re-educated about  the importance of commitment to a  minimum of 150 minutes of exercise per week as able.  The importance of healthy food choices with portion control discussed, as well as eating regularly and within a 12 hour window most days. The need to choose "clean , green" food 50 to 75% of the time is discussed, as well as to make water the primary drink and set a goal of 64 ounces water  daily.    Weight /BMI 12/31/2018 01/29/2018 12/04/2017  WEIGHT 209 lb 201 lb 205 lb 0.6 oz  HEIGHT 5\' 4"  5\' 4"  5\' 4"   BMI 35.87 kg/m2 34.5 kg/m2 35.2 kg/m2      Lumbar back pain with radiculopathy affecting left lower extremity Rates pain at a 10 at the end of the work day. Update MRI, has LLE weakness and numbness, denies incontinence Uncontrolled.Toradol and depo medrol administered IM in the office , to be followed by a short course of oral prednisone and NSAIDS.   Nausea Zofran 4 mg IM administered  Vitamin D deficiency Needs treatment x 6 months, then daily oTC vit D3 2000  iU

## 2018-12-31 NOTE — Patient Instructions (Addendum)
F/U in office with MD in 4.5 months, call if you need me sooner  Please schedule mammogram which is overdue , at [facility]  Work excuse for 11/16 to retuern 01/05/2019  Toradol 60 mg , depo medrol 80 mg and Zofran 4 mg im in office for headache, nausea and back pain  Please reconsider the flu vaccine, you NEED this esp as you are asthmatic  Labs today, CBc, lipid, cmp and EGFr , TSH, Vit D  You are referred to GI for colonoscope, that office will call  Medications are prescribed for management of acute migraine , please take as directed  You are referred for  MRI lumbar spine

## 2019-01-02 ENCOUNTER — Encounter: Payer: Self-pay | Admitting: Family Medicine

## 2019-01-02 DIAGNOSIS — M5116 Intervertebral disc disorders with radiculopathy, lumbar region: Secondary | ICD-10-CM | POA: Insufficient documentation

## 2019-01-02 DIAGNOSIS — M5416 Radiculopathy, lumbar region: Secondary | ICD-10-CM | POA: Insufficient documentation

## 2019-01-02 DIAGNOSIS — R110 Nausea: Secondary | ICD-10-CM | POA: Insufficient documentation

## 2019-01-02 LAB — LIPID PANEL
Cholesterol: 214 mg/dL — ABNORMAL HIGH (ref <200)
HDL: 71 mg/dL (ref >=50)
LDL Cholesterol (Calc): 121 mg/dL (calc) — ABNORMAL HIGH
Non-HDL Cholesterol (Calc): 143 mg/dL (calc) — ABNORMAL HIGH (ref <130)
Total CHOL/HDL Ratio: 3 (calc) (ref <5.0)
Triglycerides: 109 mg/dL (ref <150)

## 2019-01-02 LAB — CBC
HCT: 44.7 % (ref 35.0–45.0)
Hemoglobin: 14.6 g/dL (ref 11.7–15.5)
MCH: 28.1 pg (ref 27.0–33.0)
MCHC: 32.7 g/dL (ref 32.0–36.0)
MCV: 86 fL (ref 80.0–100.0)
MPV: 9.7 fL (ref 7.5–12.5)
Platelets: 305 10*3/uL (ref 140–400)
RBC: 5.2 10*6/uL — ABNORMAL HIGH (ref 3.80–5.10)
RDW: 13.1 % (ref 11.0–15.0)
WBC: 6.4 10*3/uL (ref 3.8–10.8)

## 2019-01-02 LAB — VITAMIN D 25 HYDROXY (VIT D DEFICIENCY, FRACTURES): Vit D, 25-Hydroxy: 17 ng/mL — ABNORMAL LOW (ref 30–100)

## 2019-01-02 LAB — COMPLETE METABOLIC PANEL WITH GFR
AG Ratio: 1.4 (calc) (ref 1.0–2.5)
ALT: 14 U/L (ref 6–29)
AST: 15 U/L (ref 10–35)
Albumin: 4 g/dL (ref 3.6–5.1)
Alkaline phosphatase (APISO): 69 U/L (ref 37–153)
BUN: 13 mg/dL (ref 7–25)
CO2: 26 mmol/L (ref 20–32)
Calcium: 9.4 mg/dL (ref 8.6–10.4)
Chloride: 104 mmol/L (ref 98–110)
Creat: 0.86 mg/dL (ref 0.50–1.05)
GFR, Est African American: 88 mL/min/{1.73_m2} (ref >=60)
GFR, Est Non African American: 76 mL/min/{1.73_m2} (ref >=60)
Globulin: 2.9 g/dL (calc) (ref 1.9–3.7)
Glucose, Bld: 86 mg/dL (ref 65–99)
Potassium: 4.5 mmol/L (ref 3.5–5.3)
Sodium: 140 mmol/L (ref 135–146)
Total Bilirubin: 0.4 mg/dL (ref 0.2–1.2)
Total Protein: 6.9 g/dL (ref 6.1–8.1)

## 2019-01-02 LAB — TSH: TSH: 1.18 mIU/L

## 2019-01-02 MED ORDER — ERGOCALCIFEROL 1.25 MG (50000 UT) PO CAPS: 50000.0000 [IU] | ORAL_CAPSULE | ORAL | 1 refills | Status: DC

## 2019-01-02 NOTE — Assessment & Plan Note (Signed)
Needs treatment x 6 months, then daily oTC vit D3 2000 iU

## 2019-01-02 NOTE — Assessment & Plan Note (Signed)
Obesity linked with hypertension and back pain  Patient re-educated about  the importance of commitment to a  minimum of 150 minutes of exercise per week as able.  The importance of healthy food choices with portion control discussed, as well as eating regularly and within a 12 hour window most days. The need to choose "clean , green" food 50 to 75% of the time is discussed, as well as to make water the primary drink and set a goal of 64 ounces water daily.    Weight /BMI 12/31/2018 01/29/2018 12/04/2017  WEIGHT 209 lb 201 lb 205 lb 0.6 oz  HEIGHT 5\' 4"  5\' 4"  5\' 4"   BMI 35.87 kg/m2 34.5 kg/m2 35.2 kg/m2

## 2019-01-02 NOTE — Assessment & Plan Note (Addendum)
Rates pain at a 10 at the end of the work day. Update MRI, has LLE weakness and numbness, denies incontinence Uncontrolled.Toradol and depo medrol administered IM in the office , to be followed by a short course of oral prednisone and NSAIDS.

## 2019-01-02 NOTE — Assessment & Plan Note (Signed)
Zofran 4 mg IM administered 

## 2019-01-02 NOTE — Assessment & Plan Note (Signed)
Controlled, no change in medication DASH diet and commitment to daily physical activity for a minimum of 30 minutes discussed and encouraged, as a part of hypertension management. The importance of attaining a healthy weight is also discussed.  BP/Weight 12/31/2018 01/29/2018 12/04/2017 08/08/2017 07/02/2017 07/02/2017 0000000  Systolic BP 0000000 [id number] 123456 [id number]  Diastolic BP 88 86 90 86 84 93 80  Wt. (Lbs) 209 201 205.04 213 212 213 213.25  BMI 35.87 34.5 35.2 36.56 36.39 36.56 36.6

## 2019-01-02 NOTE — Assessment & Plan Note (Signed)
Disabling headache 4 days, slight improvement. Had only 2 immitrex and I have re educated her as to appropriate dosing to abort headache sooner as well as combining with ibuprofen. Work excuse for 1 week, stil has lingering headache

## 2019-01-04 ENCOUNTER — Encounter: Payer: Self-pay | Admitting: Gastroenterology

## 2019-01-05 ENCOUNTER — Telehealth: Payer: Self-pay

## 2019-01-05 NOTE — Telephone Encounter (Signed)
FMLA   Copied Noted Sleeved  

## 2019-01-12 ENCOUNTER — Telehealth: Payer: Self-pay | Admitting: Obstetrics and Gynecology

## 2019-01-12 NOTE — Telephone Encounter (Signed)

## 2019-01-13 ENCOUNTER — Other Ambulatory Visit: Payer: Self-pay | Admitting: Obstetrics and Gynecology

## 2019-01-14 DIAGNOSIS — I10 Essential (primary) hypertension: Secondary | ICD-10-CM

## 2019-01-18 ENCOUNTER — Ambulatory Visit (HOSPITAL_COMMUNITY): Admission: RE | Admit: 2019-01-18 | Payer: 59 | Source: Ambulatory Visit

## 2019-01-20 ENCOUNTER — Telehealth: Payer: Self-pay | Admitting: Obstetrics & Gynecology

## 2019-01-20 NOTE — Telephone Encounter (Signed)
Tried to reach the patient of her appointment/restrictions, mailbox is full.

## 2019-01-21 ENCOUNTER — Encounter: Payer: Self-pay | Admitting: Obstetrics & Gynecology

## 2019-01-21 ENCOUNTER — Ambulatory Visit (INDEPENDENT_AMBULATORY_CARE_PROVIDER_SITE_OTHER): Payer: 59 | Admitting: Obstetrics & Gynecology

## 2019-01-21 ENCOUNTER — Other Ambulatory Visit: Payer: Self-pay

## 2019-01-21 VITALS — BP 140/91 | HR 62 | Ht 65.0 in | Wt 209.5 lb

## 2019-01-21 DIAGNOSIS — Z1211 Encounter for screening for malignant neoplasm of colon: Secondary | ICD-10-CM

## 2019-01-21 DIAGNOSIS — Z1212 Encounter for screening for malignant neoplasm of rectum: Secondary | ICD-10-CM | POA: Diagnosis not present

## 2019-01-21 DIAGNOSIS — Z01419 Encounter for gynecological examination (general) (routine) without abnormal findings: Secondary | ICD-10-CM

## 2019-01-21 LAB — HEMOCCULT GUIAC POC 1CARD (OFFICE): Fecal Occult Blood, POC: NEGATIVE

## 2019-01-21 MED ORDER — METRONIDAZOLE 0.75 % VA GEL: VAGINAL | 0 refills | Status: DC

## 2019-01-21 NOTE — Progress Notes (Signed)
Subjective:     Bridget Roberts is a 53 y.o. female here for a routine exam.  Patient's last menstrual period was 01/29/2013. G1P1000 Birth Control Method:  hysterectomy Menstrual Calendar(currently): amenorrhiec hysterectomy  Current complaints: vaginal itching with odor, irritation.   Current acute medical issues:  none   Recent Gynecologic History Patient's last menstrual period was 01/29/2013. Last Pap: 2017,  normal Last mammogram: scheduled 01/22/19,  pending  Past Medical History:  Diagnosis Date  . Asthma   . Back pain   . GERD (gastroesophageal reflux disease)   . Headache(784.0)   . Hypertension   . Migraines   . PONV (postoperative nausea and vomiting)     Past Surgical History:  Procedure Laterality Date  . ECTOPIC PREGNANCY SURGERY    . ENDOMETRIAL ABLATION    . POLYPECTOMY     vocal chords  . SALPINGECTOMY  right   unilateral  . VAGINAL HYSTERECTOMY N/A 08/04/2013   Procedure: HYSTERECTOMY VAGINAL;  Surgeon: Florian Buff, MD;  Location: [facility];  Service: Gynecology;  Laterality: N/A;  . vocal chord polyp      OB History    Gravida  1   Para  1   Term  1   Preterm      AB      Living        SAB      TAB      Ectopic      Multiple      Live Births  1           Social History   Socioeconomic History  . Marital status: Single    Spouse name: Not on file  . Number of children: Not on file  . Years of education: Not on file  . Highest education level: Not on file  Occupational History  . Not on file  Tobacco Use  . Smoking status: Never Smoker  . Smokeless tobacco: Never Used  Substance and Sexual Activity  . Alcohol use: No  . Drug use: No  . Sexual activity: Not Currently    Birth control/protection: Surgical    Comment: hyst  Other Topics Concern  . Not on file  Social History Narrative  . Not on file   Social Determinants of Health   Financial Resource Strain:   . Difficulty of Paying Living Expenses: Not on  file  Food Insecurity:   . Worried About Charity fundraiser in the Last Year: Not on file  . Ran Out of Food in the Last Year: Not on file  Transportation Needs:   . Lack of Transportation (Medical): Not on file  . Lack of Transportation (Non-Medical): Not on file  Physical Activity:   . Days of Exercise per Week: Not on file  . Minutes of Exercise per Session: Not on file  Stress:   . Feeling of Stress : Not on file  Social Connections:   . Frequency of Communication with Friends and Family: Not on file  . Frequency of Social Gatherings with Friends and Family: Not on file  . Attends Religious Services: Not on file  . Active Member of Clubs or Organizations: Not on file  . Attends Archivist Meetings: Not on file  . Marital Status: Not on file    Family History  Problem Relation Age of Onset  . Prostate cancer Father   . Heart attack Mother   . Hypertension Sister   . Diabetes Sister   .  Hypertension Brother   . Hypertension Sister   . Hypertension Brother   . Hypertension Brother   . Other Son        MVA     Current Outpatient Medications:  .  albuterol (PROVENTIL HFA;VENTOLIN HFA) 108 (90 Base) MCG/ACT inhaler, Inhale 2 puffs into the lungs every 6 (six) hours as needed for wheezing or shortness of breath., Disp: 1 Inhaler, Rfl: 0 .  amLODipine (NORVASC) 5 MG tablet, Take 1 tablet (5 mg total) by mouth daily., Disp: 180 tablet, Rfl: 3 .  ergocalciferol (VITAMIN D2) 1.25 MG (50000 UT) capsule, Take 1 capsule (50,000 Units total) by mouth once a week. One capsule once weekly, Disp: 12 capsule, Rfl: 1 .  ibuprofen (ADVIL) 800 MG tablet, Take one tablet at headache onset, may repeat after 8 hours if needed for severe headache .  Maximum use is twice weekly, Disp: 30 tablet, Rfl: 0 .  ondansetron (ZOFRAN) 4 MG tablet, Take 1 tablet (4 mg total) by mouth every 8 (eight) hours as needed for nausea or vomiting., Disp: 20 tablet, Rfl: 0 .  pantoprazole (PROTONIX) 40 MG  tablet, Take one tablet daily for 2 weeks, then as needed, on days when you takeibuprofen, Disp: 30 tablet, Rfl: 0 .  SUMAtriptan (IMITREX) 100 MG tablet, One tablet at headache onset May repeat in 2 hours if headache persists or recurs. Maximum  Use is twice weekly, Disp: 10 tablet, Rfl: 0 .  SUMAtriptan (IMITREX) 100 MG tablet, Take one tablet at headache onset ,May repeat once in 2 hours if headache persists or recurs.Maximum use is two times per week, Disp: 10 tablet, Rfl: 1 .  metroNIDAZOLE (METROGEL VAGINAL) 0.75 % vaginal gel, Nightly x 5 nights, Disp: 70 g, Rfl: 0  Review of Systems  Review of Systems  Constitutional: Negative for fever, chills, weight loss, malaise/fatigue and diaphoresis.  HENT: Negative for hearing loss, ear pain, nosebleeds, congestion, sore throat, neck pain, tinnitus and ear discharge.   Eyes: Negative for blurred vision, double vision, photophobia, pain, discharge and redness.  Respiratory: Negative for cough, hemoptysis, sputum production, shortness of breath, wheezing and stridor.   Cardiovascular: Negative for chest pain, palpitations, orthopnea, claudication, leg swelling and PND.  Gastrointestinal: negative for abdominal pain. Negative for heartburn, nausea, vomiting, diarrhea, constipation, blood in stool and melena.  Genitourinary: Negative for dysuria, urgency, frequency, hematuria and flank pain.  Musculoskeletal: Negative for myalgias, back pain, joint pain and falls.  Skin: Negative for itching and rash.  Neurological: Negative for dizziness, tingling, tremors, sensory change, speech change, focal weakness, seizures, loss of consciousness, weakness and headaches.  Endo/Heme/Allergies: Negative for environmental allergies and polydipsia. Does not bruise/bleed easily.  Psychiatric/Behavioral: Negative for depression, suicidal ideas, hallucinations, memory loss and substance abuse. The patient is not nervous/anxious and does not have insomnia.         Objective:  Blood pressure (!) 140/91, pulse 62, height 5\' 5"  (1.651 m), weight 209 lb 8 oz (95 kg), last menstrual period 01/29/2013.   Physical Exam  Vitals reviewed. Constitutional: She is oriented to person, place, and time. She appears well-developed and well-nourished.  HENT:  Head: Normocephalic and atraumatic.        Right Ear: External ear normal.  Left Ear: External ear normal.  Nose: Nose normal.  Mouth/Throat: Oropharynx is clear and moist.  Eyes: Conjunctivae and EOM are normal. Pupils are equal, round, and reactive to light. Right eye exhibits no discharge. Left eye exhibits no discharge. No scleral icterus.  Neck: Normal range of motion. Neck supple. No tracheal deviation present. No thyromegaly present.  Cardiovascular: Normal rate, regular rhythm, normal heart sounds and intact distal pulses.  Exam reveals no gallop and no friction rub.   No murmur heard. Respiratory: Effort normal and breath sounds normal. No respiratory distress. She has no wheezes. She has no rales. She exhibits no tenderness.  GI: Soft. Bowel sounds are normal. She exhibits no distension and no mass. There is no tenderness. There is no rebound and no guarding.  Genitourinary:  Breasts no masses skin changes or nipple changes bilaterally      Vulva is normal without lesions Vagina is pink moist without discharge Cervix surgically absent Uterus is surgically absent Adnexa is negative  {Rectal    hemoccult negative, normal tone, no masses  Musculoskeletal: Normal range of motion. She exhibits no edema and no tenderness.  Neurological: She is alert and oriented to person, place, and time. She has normal reflexes. She displays normal reflexes. No cranial nerve deficit. She exhibits normal muscle tone. Coordination normal.  Skin: Skin is warm and dry. No rash noted. No erythema. No pallor.  Psychiatric: She has a normal mood and affect. Her behavior is normal. Judgment and thought content normal.        Medications Ordered at today's visit: Meds ordered this encounter  Medications  . metroNIDAZOLE (METROGEL VAGINAL) 0.75 % vaginal gel    Sig: Nightly x 5 nights    Dispense:  70 g    Refill:  0    Other orders placed at today's visit: No orders of the defined types were placed in this encounter.     Assessment:    Healthy female exam.   BV symptoms Plan:    Contraception: status post hysterectomy. Mammogram ordered. Follow up in: prn years.    Meds ordered this encounter  Medications  . metroNIDAZOLE (METROGEL VAGINAL) 0.75 % vaginal gel    Sig: Nightly x 5 nights    Dispense:  70 g    Refill:  0    No follow-ups on file.

## 2019-01-21 NOTE — Addendum Note (Signed)
Addended by: Linton Rump on: 01/21/2019 11:59 AM   Modules accepted: Orders

## 2019-01-22 ENCOUNTER — Inpatient Hospital Stay (HOSPITAL_COMMUNITY): Admission: RE | Admit: 2019-01-22 | Payer: 59 | Source: Ambulatory Visit

## 2019-02-03 ENCOUNTER — Other Ambulatory Visit: Payer: Self-pay | Admitting: Family Medicine

## 2019-02-06 ENCOUNTER — Other Ambulatory Visit: Payer: Self-pay | Admitting: Family Medicine

## 2019-02-09 ENCOUNTER — Ambulatory Visit: Payer: 59

## 2019-02-10 ENCOUNTER — Telehealth: Payer: Self-pay | Admitting: Gastroenterology

## 2019-02-10 ENCOUNTER — Encounter: Payer: Self-pay | Admitting: Gastroenterology

## 2019-02-10 NOTE — Telephone Encounter (Signed)
Noted  

## 2019-02-10 NOTE — Telephone Encounter (Signed)
Patient was a no show and letter sent  °

## 2019-02-25 ENCOUNTER — Other Ambulatory Visit: Payer: Self-pay | Admitting: Obstetrics and Gynecology

## 2019-04-12 ENCOUNTER — Other Ambulatory Visit: Payer: Self-pay

## 2019-04-12 ENCOUNTER — Emergency Department (HOSPITAL_COMMUNITY): Payer: 59

## 2019-04-12 ENCOUNTER — Emergency Department (HOSPITAL_COMMUNITY)
Admission: EM | Admit: 2019-04-12 | Discharge: 2019-04-12 | Disposition: A | Discharge status: Home / Self Care | Payer: 59 | Attending: Emergency Medicine | Admitting: Emergency Medicine

## 2019-04-12 ENCOUNTER — Encounter (HOSPITAL_COMMUNITY): Payer: Self-pay | Admitting: *Deleted

## 2019-04-12 DIAGNOSIS — Y939 Activity, unspecified: Secondary | ICD-10-CM | POA: Diagnosis not present

## 2019-04-12 DIAGNOSIS — Y9259 Other trade areas as the place of occurrence of the external cause: Secondary | ICD-10-CM | POA: Insufficient documentation

## 2019-04-12 DIAGNOSIS — I10 Essential (primary) hypertension: Secondary | ICD-10-CM | POA: Diagnosis not present

## 2019-04-12 DIAGNOSIS — J45909 Unspecified asthma, uncomplicated: Secondary | ICD-10-CM | POA: Insufficient documentation

## 2019-04-12 DIAGNOSIS — Z79899 Other long term (current) drug therapy: Secondary | ICD-10-CM | POA: Insufficient documentation

## 2019-04-12 DIAGNOSIS — S6991XA Unspecified injury of right wrist, hand and finger(s), initial encounter: Secondary | ICD-10-CM | POA: Diagnosis present

## 2019-04-12 DIAGNOSIS — Z23 Encounter for immunization: Secondary | ICD-10-CM | POA: Diagnosis not present

## 2019-04-12 DIAGNOSIS — S62639A Displaced fracture of distal phalanx of unspecified finger, initial encounter for closed fracture: Secondary | ICD-10-CM

## 2019-04-12 DIAGNOSIS — S62634A Displaced fracture of distal phalanx of right ring finger, initial encounter for closed fracture: Secondary | ICD-10-CM | POA: Diagnosis not present

## 2019-04-12 DIAGNOSIS — Y990 Civilian activity done for income or pay: Secondary | ICD-10-CM | POA: Diagnosis not present

## 2019-04-12 DIAGNOSIS — W230XXA Caught, crushed, jammed, or pinched between moving objects, initial encounter: Secondary | ICD-10-CM | POA: Insufficient documentation

## 2019-04-12 MED ORDER — LIDOCAINE HCL (PF) 1 % IJ SOLN
5.0000 mL | Freq: Once | INTRAMUSCULAR | Status: DC
  Filled 2019-04-12: qty 6

## 2019-04-12 MED ORDER — HYDROCODONE-ACETAMINOPHEN 5-325 MG PO TABS: 1.0000 | ORAL_TABLET | ORAL | 0 refills | Status: DC | PRN

## 2019-04-12 MED ORDER — DOXYCYCLINE HYCLATE 100 MG PO CAPS: 100.0000 mg | ORAL_CAPSULE | Freq: Two times a day (BID) | ORAL | 0 refills | Status: DC

## 2019-04-12 MED ORDER — HYDROCODONE-ACETAMINOPHEN 5-325 MG PO TABS
1.0000 | ORAL_TABLET | Freq: Once | ORAL | Status: AC
  Administered 2019-04-12: 1 via ORAL
  Filled 2019-04-12: qty 1

## 2019-04-12 MED ORDER — TETANUS-DIPHTH-ACELL PERTUSSIS 5-2.5-18.5 LF-MCG/0.5 IM SUSP
0.5000 mL | Freq: Once | INTRAMUSCULAR | Status: AC
  Administered 2019-04-12: 0.5 mL via INTRAMUSCULAR
  Filled 2019-04-12: qty 0.5

## 2019-04-12 MED ORDER — POVIDONE-IODINE 10 % EX SOLN
CUTANEOUS | Status: AC
  Filled 2019-04-12: qty 15

## 2019-04-12 NOTE — ED Triage Notes (Signed)
Pt with right ring finger that was smashed on a machine at work, occurred last night.  Occurred at work but not worker's comp. Per pt.

## 2019-04-12 NOTE — ED Provider Notes (Signed)
[facility] EMERGENCY DEPARTMENT Provider Note   CSN: [id number] Arrival date & time: 04/12/19  1200    History Chief Complaint  Patient presents with  . Finger Injury    Bridget Roberts is a 54 y.o. female with past medical history significant for hypertension, migraine who presents for evaluation of hand injury.  Patient states yesterday evening approximate 11 PM, 13 hours PTA she got her finger caught in a machine at work.  She suffered a laceration to her nailbed and had part of her acrylic on her nails pulled off.  Patient states she was addressed by the nurse at work who cleaned this off and placed a Band-Aid on it.  Has had continued pain throughout the night.  Unsure last tetanus.  Rates her pain a 7/10.  Described as throbbing.  Denies any bleeding, drainage or swelling.  No paresthesias.  Denies additional aggravating or alleviating factors  History obtained from patient and past medical records.  No interpreter is used.  HPI     Past Medical History:  Diagnosis Date  . Asthma   . Back pain   . GERD (gastroesophageal reflux disease)   . Headache(784.0)   . Hypertension   . Migraines   . PONV (postoperative nausea and vomiting)     Patient Active Problem List   Diagnosis Date Noted  . Lumbar back pain with radiculopathy affecting left lower extremity 01/02/2019  . Nausea 01/02/2019  . Neck mass 10/12/2015  . Essential hypertension 10/12/2015  . 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 Migraine headache 09/04/2012  . Hyperlipidemia LDL goal <100 05/11/2012  . Vitamin D deficiency 04/11/2011  . Morbid obesity ([place]) 06/01/2007    Past Surgical History:  Procedure Laterality Date  . ABDOMINAL HYSTERECTOMY    . ECTOPIC PREGNANCY SURGERY    . ENDOMETRIAL ABLATION    . POLYPECTOMY     vocal chords  . SALPINGECTOMY  right   unilateral  . VAGINAL HYSTERECTOMY N/A 08/04/2013   Procedure: HYSTERECTOMY VAGINAL;  Surgeon: Florian Buff, MD;  Location: [facility];  Service:  Gynecology;  Laterality: N/A;  . vocal chord polyp       OB History    Gravida  1   Para  1   Term  1   Preterm      AB      Living        SAB      TAB      Ectopic      Multiple      Live Births  1           Family History  Problem Relation Age of Onset  . Prostate cancer Father   . Heart attack Mother   . Hypertension Sister   . Diabetes Sister   . Hypertension Brother   . Hypertension Sister   . Hypertension Brother   . Hypertension Brother   . Other Son        MVA    Social History   Tobacco Use  . Smoking status: Never Smoker  . Smokeless tobacco: Never Used  Substance Use Topics  . Alcohol use: No  . Drug use: No    Home Medications Prior to Admission medications   Medication Sig Start Date End Date Taking? Authorizing Provider  albuterol (PROVENTIL HFA;VENTOLIN HFA) 108 (90 Base) MCG/ACT inhaler Inhale 2 puffs into the lungs every 6 (six) hours as needed for wheezing or shortness of breath. 12/04/17   Fayrene Helper,  MD  amLODipine (NORVASC) 5 MG tablet Take 1 tablet (5 mg total) by mouth daily. 01/29/18 12/31/19  Herminio Commons, MD  doxycycline (VIBRAMYCIN) 100 MG capsule Take 1 capsule (100 mg total) by mouth 2 (two) times daily. 04/12/19   Cami Delawder A, PA-C  ergocalciferol (VITAMIN D2) 1.25 MG (50000 UT) capsule Take 1 capsule (50,000 Units total) by mouth once a week. One capsule once weekly 01/02/19   Fayrene Helper, MD  HYDROcodone-acetaminophen (NORCO/VICODIN) 5-325 MG tablet Take 1 tablet by mouth every 4 (four) hours as needed. 04/12/19   Medardo Hassing A, PA-C  ibuprofen (ADVIL) 800 MG tablet TAKE 1 TABLET BY MOUTH AT ONSET OF HEADACHE , MAY REPEAT AFTER 8 HOURS IF NEEDED 02/03/19   Fayrene Helper, MD  metroNIDAZOLE (METROGEL VAGINAL) 0.75 % vaginal gel Nightly x 5 nights 01/21/19   Florian Buff, MD  ondansetron (ZOFRAN) 4 MG tablet Take 1 tablet (4 mg total) by mouth every 8 (eight) hours as needed for  nausea or vomiting. 12/31/18   Fayrene Helper, MD  pantoprazole (PROTONIX) 40 MG tablet TAKE ONE TABLET DAILY FOR 2 WEEKS, THEN AS NEEDED, ON DAYS WHEN YOU TAKE IBUPROFEN 02/08/19   Fayrene Helper, MD  SUMAtriptan (IMITREX) 100 MG tablet One tablet at headache onset May repeat in 2 hours if headache persists or recurs. Maximum  Use is twice weekly 12/05/17   Fayrene Helper, MD  SUMAtriptan (IMITREX) 100 MG tablet Take one tablet at headache onset ,May repeat once in 2 hours if headache persists or recurs.Maximum use is two times per week 12/31/18   Fayrene Helper, MD    Allergies    Penicillins  Review of Systems   Review of Systems  Constitutional: Negative.   HENT: Negative.   Respiratory: Negative.   Cardiovascular: Negative.   Gastrointestinal: Negative.   Genitourinary: Negative.   Musculoskeletal:       Right 4th digit pain  Skin: Positive for wound.  Neurological: Negative.   All other systems reviewed and are negative.   Physical Exam Updated Vital Signs BP (!) 159/75 (BP Location: Left Arm)   Pulse 68   Temp 98.2 F (36.8 C) (Oral)   Resp 14   Ht 5\' 4"  (1.626 m)   Wt 94.3 kg   LMP 01/29/2013   SpO2 99%   BMI 35.70 kg/m   Physical Exam Vitals and nursing note reviewed.  Constitutional:      General: She is not in acute distress.    Appearance: She is well-developed. She is not ill-appearing, toxic-appearing or diaphoretic.  HENT:     Head: Normocephalic and atraumatic.     Nose: Nose normal.     Mouth/Throat:     Mouth: Mucous membranes are moist.     Pharynx: Oropharynx is clear.  Eyes:     Pupils: Pupils are equal, round, and reactive to light.  Cardiovascular:     Rate and Rhythm: Normal rate.     Pulses: Normal pulses.     Heart sounds: Normal heart sounds.  Pulmonary:     Effort: Pulmonary effort is normal. No respiratory distress.     Breath sounds: Normal breath sounds.  Abdominal:     General: Bowel sounds are normal.  There is no distension.  Musculoskeletal:        General: Signs of injury present. No swelling or deformity. Normal range of motion.     Right hand: Laceration and tenderness present. No swelling, deformity  or bony tenderness. Normal range of motion. Normal strength. Normal sensation. There is no disruption of two-point discrimination. Normal capillary refill. Normal pulse.     Left hand: Normal.       Hands:     Cervical back: Normal range of motion.     Right lower leg: No edema.     Left lower leg: No edema.     Comments: No bony tenderness on exam.  No edema, erythema or warmth.  Full range of motion to digits and hand  Skin:    General: Skin is warm and dry.     Capillary Refill: Capillary refill takes less than 2 seconds.     Comments: Laceration to nail bed to fourth digit on left upper extremity.  No bleeding or drainage.  Neurological:     General: No focal deficit present.     Mental Status: She is alert and oriented to person, place, and time.       ED Results / Procedures / Treatments   Labs (all labs ordered are listed, but only abnormal results are displayed) Labs Reviewed - No data to display  EKG None  Radiology DG Finger Ring Right  Result Date: 04/12/2019 CLINICAL DATA:  Crush injury distal right ring finger in a machine last night. Initial encounter. EXAM: RIGHT RING FINGER 2+V COMPARISON:  None. FINDINGS: There is a nondisplaced fracture of the tuft of the ring finger. Associated soft tissue swelling is noted. No radiopaque foreign body or soft tissue gas. IMPRESSION: Nondisplaced fracture tuft of the right ring finger. Electronically Signed   By: Inge Rise M.D.   On: 04/12/2019 12:48    Procedures Procedures (including critical care time)  Medications Ordered in ED Medications  lidocaine (PF) (XYLOCAINE) 1 % injection 5 mL (has no administration in time range)  povidone-iodine (BETADINE) 10 % external solution (has no administration in time range)   Tdap (BOOSTRIX) injection 0.5 mL (0.5 mLs Intramuscular Given 04/12/19 1251)  HYDROcodone-acetaminophen (NORCO/VICODIN) 5-325 MG per tablet 1 tablet (1 tablet Oral Given 04/12/19 1254)    ED Course  I have reviewed the triage vital signs and the nursing notes.  Pertinent labs & imaging results that were available during my care of the patient were reviewed by me and considered in my medical decision making (see chart for details).  54 year old female presents for evaluation of finger injury on fourth digit on upper extremity after getting this caught on a machine yesterday evening approximately 13 hours PTA.  No bleeding or drainage.  No edema, erythema or warmth.  She has had some generalized tenderness to distal aspect of finger.  Patient with laceration involving nail.  She does have acrylic nails on.  Unknown last tetanus, will update. Nail bed firmly attaches at lateral edges and proximal nail bed. Will obtain x-ray.  Dg right ring finger with distal tuft fracture. Discussed with Attending Dr. Roderic Palau. Does not recommend nail removal given firm attachment. Rec PO abx and follow up with hand surgery. No subungual hematoma. Will start on Abx and have her follow up with hand surgery.  The patient has been appropriately medically screened and/or stabilized in the ED. I have low suspicion for any other emergent medical condition which would require further screening, evaluation or treatment in the ED or require inpatient management.  Patient is hemodynamically stable and in no acute distress.  Patient able to ambulate in department prior to ED.  Evaluation does not show acute pathology that would require ongoing or  additional emergent interventions while in the emergency department or further inpatient treatment.  I have discussed the diagnosis with the patient and answered all questions.  Pain is been managed while in the emergency department and patient has no further complaints prior to discharge.   Patient is comfortable with plan discussed in room and is stable for discharge at this time.  I have discussed strict return precautions for returning to the emergency department.  Patient was encouraged to follow-up with PCP/specialist refer to at discharge.    MDM Rules/Calculators/A&P                       Final Clinical Impression(s) / ED Diagnoses Final diagnoses:  Closed fracture of tuft of distal phalanx of finger    Rx / DC Orders ED Discharge Orders         Ordered    HYDROcodone-acetaminophen (NORCO/VICODIN) 5-325 MG tablet  Every 4 hours PRN     04/12/19 1339    doxycycline (VIBRAMYCIN) 100 MG capsule  2 times daily     04/12/19 1339           Tymeer Vaquera A, PA-C 04/12/19 1340    Milton Ferguson, MD 04/12/19 1344

## 2019-04-12 NOTE — Discharge Instructions (Addendum)
Take the antibiotics as prescribed.   Follow up with hand surgery for reevaluation.  Return for new or worsening symptoms.

## 2019-04-14 DIAGNOSIS — M79644 Pain in right finger(s): Secondary | ICD-10-CM | POA: Insufficient documentation

## 2019-04-24 ENCOUNTER — Ambulatory Visit: Payer: Self-pay

## 2019-05-18 ENCOUNTER — Ambulatory Visit: Payer: 59 | Admitting: Family Medicine

## 2019-07-07 ENCOUNTER — Other Ambulatory Visit: Payer: Self-pay | Admitting: Family Medicine

## 2019-10-13 ENCOUNTER — Ambulatory Visit: Payer: 59 | Admitting: Family Medicine

## 2019-12-08 ENCOUNTER — Ambulatory Visit: Payer: Self-pay

## 2019-12-08 ENCOUNTER — Other Ambulatory Visit: Payer: Self-pay

## 2019-12-08 ENCOUNTER — Other Ambulatory Visit: Payer: Self-pay | Admitting: Occupational Medicine

## 2019-12-08 DIAGNOSIS — M79672 Pain in left foot: Secondary | ICD-10-CM

## 2019-12-17 ENCOUNTER — Ambulatory Visit (INDEPENDENT_AMBULATORY_CARE_PROVIDER_SITE_OTHER): Payer: Worker's Compensation

## 2019-12-17 ENCOUNTER — Ambulatory Visit (INDEPENDENT_AMBULATORY_CARE_PROVIDER_SITE_OTHER): Payer: Worker's Compensation | Admitting: Orthopaedic Surgery

## 2019-12-17 ENCOUNTER — Other Ambulatory Visit: Payer: Self-pay

## 2019-12-17 ENCOUNTER — Encounter: Payer: Self-pay | Admitting: Orthopaedic Surgery

## 2019-12-17 VITALS — BP 143/86 | Ht 64.0 in | Wt 211.0 lb

## 2019-12-17 DIAGNOSIS — M79672 Pain in left foot: Secondary | ICD-10-CM

## 2019-12-17 MED ORDER — IBUPROFEN 800 MG PO TABS: 800.0000 mg | ORAL_TABLET | Freq: Two times a day (BID) | ORAL | 0 refills | Status: DC | PRN

## 2019-12-17 NOTE — Progress Notes (Signed)
Office Visit Note   Patient: Bridget Roberts           Date of Birth: [birth year]           MRN: [id number] Visit Date: 12/17/2019              Requested by: Fayrene Helper, [address], [address] [place],  [place] [address] PCP: Fayrene Helper, MD   Assessment & Plan: Visit Diagnoses:  1. Pain in left foot     Plan: Patient mains in postop shoe. She can elevate her foot for swelling. I discussed with ecchymosis pain and swelling should resolve with time. Work slip given no work [employer]. Recheck 2 weeks. Ibuprofen 800 mg p.o. twice daily sent into her [facility] pharmacy.  Follow-Up Instructions: No follow-ups on file.   Orders:  Orders Placed This Encounter  Procedures  . XR Foot Complete Left   No orders of the defined types were placed in this encounter.     Procedures: No procedures performed   Clinical Data: No additional findings.   Subjective: Chief Complaint  Patient presents with  . Left Foot - Pain    OTJI 12/08/2019    HPI 54 year old female works at [employer] which is compressed roll of paper on her left [place] on 12/08/2019 and had x-ray showing questionable fifth metatarsal neck fracture.  She is placed in a postop shoe she has been using an ankle brace since the postop shoe is roomy and her foot slides around.  She has ecchymosis over the dorsum of the foot base of the toes second third and fourth.  She had some ibuprofen when she is taken.  Date of injury was 12/08/2019.  States she not able to be on the floor at work with an open toed shoe and is not able to wear tennis shoe at this time.  Review of Systems previous injured her foot several years ago treated by me which she states was injury to her arch.  Possible hypertension and history of back pain all other systems are negative as pertains to HPI.   Objective: Vital Signs: BP (!) 143/86   Ht 5\' 4"  (1.626 m)   Wt 211 lb (95.7 kg)   LMP 01/29/2013   BMI  36.22 kg/m   Physical Exam Constitutional:      Appearance: She is well-developed.  HENT:     Head: Normocephalic.     Right Ear: External ear normal.     Left Ear: External ear normal.  Eyes:     Pupils: Pupils are equal, round, and reactive to light.  Neck:     Thyroid: No thyromegaly.     Trachea: No tracheal deviation.  Cardiovascular:     Rate and Rhythm: Normal rate.  Pulmonary:     Effort: Pulmonary effort is normal.  Abdominal:     Palpations: Abdomen is soft.  Skin:    General: Skin is warm and dry.  Neurological:     Mental Status: She is alert and oriented to person, place, and time.  Psychiatric:        Behavior: Behavior normal.     Ortho Exam patient has ecchymosis base second third and fourth toes.  Tenderness over forefoot.  No palpable step-off of metatarsals.  Specialty Comments:  No specialty comments available.  Imaging: No results found.   PMFS History: Patient Active Problem List   Diagnosis Date Noted  . Lumbar back pain with  radiculopathy affecting left lower extremity 01/02/2019  . Nausea 01/02/2019  . Neck mass 10/12/2015  . Essential hypertension 10/12/2015  . 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 Migraine headache 09/04/2012  . Hyperlipidemia LDL goal <100 05/11/2012  . Vitamin D deficiency 04/11/2011  . Morbid obesity ([place]) 06/01/2007   Past Medical History:  Diagnosis Date  . Asthma   . Back pain   . GERD (gastroesophageal reflux disease)   . Headache(784.0)   . Hypertension   . Migraines   . PONV (postoperative nausea and vomiting)     Family History  Problem Relation Age of Onset  . Prostate cancer Father   . Heart attack Mother   . Hypertension Sister   . Diabetes Sister   . Hypertension Brother   . Hypertension Sister   . Hypertension Brother   . Hypertension Brother   . Other Son        MVA    Past Surgical History:  Procedure Laterality Date  . ABDOMINAL HYSTERECTOMY    . ECTOPIC PREGNANCY SURGERY    .  ENDOMETRIAL ABLATION    . POLYPECTOMY     vocal chords  . SALPINGECTOMY  right   unilateral  . VAGINAL HYSTERECTOMY N/A 08/04/2013   Procedure: HYSTERECTOMY VAGINAL;  Surgeon: Florian Buff, MD;  Location: [facility];  Service: Gynecology;  Laterality: N/A;  . vocal chord polyp     Social History   Occupational History  . Not on file  Tobacco Use  . Smoking status: Never Smoker  . Smokeless tobacco: Never Used  Vaping Use  . Vaping Use: Never used  Substance and Sexual Activity  . Alcohol use: No  . Drug use: No  . Sexual activity: Not Currently    Birth control/protection: Surgical    Comment: hyst

## 2019-12-29 ENCOUNTER — Ambulatory Visit (INDEPENDENT_AMBULATORY_CARE_PROVIDER_SITE_OTHER): Payer: 59 | Admitting: Family Medicine

## 2019-12-29 ENCOUNTER — Encounter: Payer: Self-pay | Admitting: Family Medicine

## 2019-12-29 ENCOUNTER — Other Ambulatory Visit: Payer: Self-pay

## 2019-12-29 VITALS — BP 138/92 | HR 107 | Resp 15 | Ht 64.0 in | Wt 217.0 lb

## 2019-12-29 DIAGNOSIS — G43109 Migraine with aura, not intractable, without status migrainosus: Secondary | ICD-10-CM | POA: Diagnosis not present

## 2019-12-29 DIAGNOSIS — I10 Essential (primary) hypertension: Secondary | ICD-10-CM | POA: Diagnosis not present

## 2019-12-29 DIAGNOSIS — Z1231 Encounter for screening mammogram for malignant neoplasm of breast: Secondary | ICD-10-CM

## 2019-12-29 NOTE — Patient Instructions (Addendum)
F/U in office with MD in 4 months, please call if you need me before  Please schedule mammogram for early morning  Appointment, 7/ 7:30 in [place] as soon as possible give appt at checkout please  Nurse please provide healthy plate sheet for good nutrition  Weight loss goal of 10 pounds in 4 months  Please reconsider flu vaccine  Need colonoscopy please let me know when you are ready  for referral, this is extremely important  Please sign for  recent labs to be sent to [place]  Thanks for choosing [facility], we consider it a privelige to serve you.

## 2019-12-30 ENCOUNTER — Encounter: Payer: Self-pay | Admitting: Family Medicine

## 2019-12-30 NOTE — Assessment & Plan Note (Signed)
  Patient re-educated about  the importance of commitment to a  minimum of 150 minutes of exercise per week as able.  The importance of healthy food choices with portion control discussed, as well as eating regularly and within a 12 hour window most days. The need to choose "clean , green" food 50 to 75% of the time is discussed, as well as to make water the primary drink and set a goal of 64 ounces water daily.    Weight /BMI 12/29/2019 12/17/2019 04/12/2019  WEIGHT 217 lb 211 lb 208 lb  HEIGHT 5\' 4"  5\' 4"  5\' 4"   BMI 37.25 kg/m2 36.22 kg/m2 35.7 kg/m2

## 2019-12-30 NOTE — Assessment & Plan Note (Signed)
Controlled, no change in medication  

## 2019-12-30 NOTE — Progress Notes (Signed)
   Bridget Roberts     MRN: [id number]      DOB: 11-18-1965   HPI Ms. Timme is here for follow up and re-evaluation of chronic medical conditions, medication management and review of any available recent lab and radiology data.  Preventive health is updated, specifically  Cancer screening and Immunization.   Injured left foot on the job several weeks ago, being cared for through [employer], wearing a hard shoe, and still unable to weight bear without significant pain The PT denies any adverse reactions to current medications since the last visit.  There are concerns about weight gain and possibly uncontrolled blood pressure although she reports good readings at home  C/o stress on the job, and difficulty committing to needed medical exams because of unavailability of timeoff from work, does state wants to do colonoscopy and will call back. No f/h of colon cancer and no abdominal pain, change in stool caliber or bowel movements  ROS Denies recent fever or chills. Denies sinus pressure, nasal congestion, ear pain or sore throat. Denies chest congestion, productive cough or wheezing. Denies chest pains, palpitations and leg swelling Denies abdominal pain, nausea, vomiting,diarrhea or constipation.   Denies dysuria, frequency, hesitancy or incontinence.  Denies uncontrolled headaches, seizures, numbness, or tingling. Denies depression, anxiety or insomnia. Denies skin break down or rash.   PE  BP (!) 138/92   Pulse (!) 107   Resp 15   Ht 5\' 4"  (1.626 m)   Wt 217 lb (98.4 kg)   LMP 01/29/2013   SpO2 100%   BMI 37.25 kg/m   Patient alert and oriented and in no cardiopulmonary distress.  HEENT: No facial asymmetry, EOMI,     Neck supple .  Chest: Clear to auscultation bilaterally.  CVS: S1, S2 no murmurs, no S3.Regular rate.  ABD: Soft non tender.   Ext: No edema  MS: Adequate ROM spine, shoulders, hips and knees.  Skin: Intact, no ulcerations or rash noted.  Psych:  Good eye contact, normal affect. Memory intact not anxious or depressed appearing.  CNS: CN 2-12 intact, power,  normal throughout.no focal deficits noted.   Assessment & Plan  Essential hypertension UnControlled, no change in medication DASH diet and commitment to daily physical activity for a minimum of 30 minutes discussed and encouraged, as a part of hypertension management. The importance of attaining a healthy weight is also discussed.  BP/Weight 12/29/2019 12/17/2019 04/12/2019 01/21/2019 12/31/2018 01/29/2018 19/62/2297  Systolic BP 989 211 941 740 814 481 856  Diastolic BP 92 86 88 91 88 86 90  Wt. (Lbs) 217 211 208 209.5 209 201 205.04  BMI 37.25 36.22 35.7 34.86 35.87 34.5 35.2        Morbid obesity ([place])  Patient re-educated about  the importance of commitment to a  minimum of 150 minutes of exercise per week as able.  The importance of healthy food choices with portion control discussed, as well as eating regularly and within a 12 hour window most days. The need to choose "clean , green" food 50 to 75% of the time is discussed, as well as to make water the primary drink and set a goal of 64 ounces water daily.    Weight /BMI 12/29/2019 12/17/2019 04/12/2019  WEIGHT 217 lb 211 lb 208 lb  HEIGHT 5\' 4"  5\' 4"  5\' 4"   BMI 37.25 kg/m2 36.22 kg/m2 35.7 kg/m2      Migraine headache Controlled, no change in medication

## 2019-12-30 NOTE — Assessment & Plan Note (Signed)
UnControlled, no change in medication DASH diet and commitment to daily physical activity for a minimum of 30 minutes discussed and encouraged, as a part of hypertension management. The importance of attaining a healthy weight is also discussed.  BP/Weight 12/29/2019 12/17/2019 04/12/2019 01/21/2019 12/31/2018 01/29/2018 38/37/7939  Systolic BP 688 648 472 072 182 883 374  Diastolic BP 92 86 88 91 88 86 90  Wt. (Lbs) 217 211 208 209.5 209 201 205.04  BMI 37.25 36.22 35.7 34.86 35.87 34.5 35.2

## 2019-12-31 ENCOUNTER — Ambulatory Visit (INDEPENDENT_AMBULATORY_CARE_PROVIDER_SITE_OTHER): Payer: Worker's Compensation | Admitting: Orthopaedic Surgery

## 2019-12-31 ENCOUNTER — Encounter: Payer: Self-pay | Admitting: Orthopaedic Surgery

## 2019-12-31 ENCOUNTER — Other Ambulatory Visit: Payer: Self-pay

## 2019-12-31 DIAGNOSIS — S9032XD Contusion of left foot, subsequent encounter: Secondary | ICD-10-CM

## 2019-12-31 DIAGNOSIS — S9030XA Contusion of unspecified foot, initial encounter: Secondary | ICD-10-CM | POA: Insufficient documentation

## 2019-12-31 NOTE — Progress Notes (Signed)
Office Visit Note   Patient: Bridget Roberts           Date of Birth: [birth year]           MRN: [id number] Visit Date: 12/31/2019              Requested by: Fayrene Helper, [place], [place],  [place] [id number] PCP: Fayrene Helper, MD   Assessment & Plan: Visit Diagnoses:  1. Contusion of left foot, subsequent encounter     Plan: Patient still using the postop shoe with wooden sole.  Her normal job involves being on her feet she is not ready to resume that activity.    Follow-Up Instructions: No follow-ups on file.   Orders:  No orders of the defined types were placed in this encounter.  No orders of the defined types were placed in this encounter.     Procedures: No procedures performed   Clinical Data: No additional findings.   Subjective: Chief Complaint  Patient presents with  . Left Foot - Follow-up, Pain    OTJI 12/08/2019    HPI 54 year old female returns for follow-up 2 weeks after on-the-job injury 12/08/2019 when a roll of paper which is called a chip board fell landed on her left foot with forefoot contusion and ecchymosis.  She still has some bruising in her toes pain with range of motion fifth MTP joint.  She still using a postop shoe.  Review of Systems reviewed updated and unchanged.   Objective: Vital Signs: BP (!) 149/90   Pulse 64   Ht 5\' 4"  (1.626 m)   Wt 217 lb (98.4 kg)   LMP 01/29/2013   BMI 37.25 kg/m   Physical Exam Constitutional:      Appearance: She is well-developed.  HENT:     Head: Normocephalic.     Right Ear: External ear normal.     Left Ear: External ear normal.  Eyes:     Pupils: Pupils are equal, round, and reactive to light.  Neck:     Thyroid: No thyromegaly.     Trachea: No tracheal deviation.  Cardiovascular:     Rate and Rhythm: Normal rate.  Pulmonary:     Effort: Pulmonary effort is normal.  Abdominal:     Palpations: Abdomen is soft.  Skin:    General: Skin is warm and  dry.  Neurological:     Mental Status: She is alert and oriented to person, place, and time.  Psychiatric:        Behavior: Behavior normal.     Ortho Exam patient has decreased fifth MTP range of motion with pain.  Toe ecchymosis dorsally is resolving plantar surface of the foot and toes are intact.  Normal pulses.  EHL and FHL function is intact.  Specialty Comments:  No specialty comments available.  Imaging: No results found.   PMFS History: Patient Active Problem List   Diagnosis Date Noted  . Foot contusion 12/31/2019  . Essential hypertension 10/12/2015  . 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 Migraine headache 09/04/2012  . Hyperlipidemia LDL goal <100 05/11/2012  . Vitamin D deficiency 04/11/2011  . Morbid obesity ([place]) 06/01/2007   Past Medical History:  Diagnosis Date  . Asthma   . Back pain   . GERD (gastroesophageal reflux disease)   . Headache(784.0)   . Hypertension   . Migraines   . PONV (postoperative nausea and vomiting)     Family History  Problem Relation Age  of Onset  . Prostate cancer Father   . Heart attack Mother   . Hypertension Sister   . Diabetes Sister   . Hypertension Brother   . Hypertension Sister   . Hypertension Brother   . Hypertension Brother   . Other Son        MVA    Past Surgical History:  Procedure Laterality Date  . ABDOMINAL HYSTERECTOMY    . ECTOPIC PREGNANCY SURGERY    . ENDOMETRIAL ABLATION    . POLYPECTOMY     vocal chords  . SALPINGECTOMY  right   unilateral  . VAGINAL HYSTERECTOMY N/A 08/04/2013   Procedure: HYSTERECTOMY VAGINAL;  Surgeon: Florian Buff, MD;  Location: [facility];  Service: Gynecology;  Laterality: N/A;  . vocal chord polyp     Social History   Occupational History  . Not on file  Tobacco Use  . Smoking status: Never Smoker  . Smokeless tobacco: Never Used  Vaping Use  . Vaping Use: Never used  Substance and Sexual Activity  . Alcohol use: No  . Drug use: No  . Sexual activity: Not  Currently    Birth control/protection: Surgical    Comment: hyst

## 2020-01-04 ENCOUNTER — Telehealth: Payer: Self-pay | Admitting: Orthopaedic Surgery

## 2020-01-04 NOTE — Telephone Encounter (Signed)
WC Jessica with [employer] called asking we fax the appt notes from [place] 12/17/19 and 12/31/19 made to the attention of jessica  Fax# [phone]

## 2020-01-14 ENCOUNTER — Other Ambulatory Visit: Payer: Self-pay

## 2020-01-14 ENCOUNTER — Ambulatory Visit (INDEPENDENT_AMBULATORY_CARE_PROVIDER_SITE_OTHER): Payer: Worker's Compensation | Admitting: Orthopaedic Surgery

## 2020-01-14 ENCOUNTER — Encounter: Payer: Self-pay | Admitting: Orthopaedic Surgery

## 2020-01-14 VITALS — Ht 64.0 in | Wt 217.0 lb

## 2020-01-14 DIAGNOSIS — S9032XD Contusion of left foot, subsequent encounter: Secondary | ICD-10-CM | POA: Diagnosis not present

## 2020-01-14 NOTE — Progress Notes (Signed)
Office Visit Note   Patient: Bridget Roberts           Date of Birth: Dec 28, 1965           MRN: [id number] Visit Date: 01/14/2020              Requested by: Fayrene Helper, [place], [place],   [id number] PCP: Fayrene Helper, MD   Assessment & Plan: Visit Diagnoses:  1. Contusion of left foot, subsequent encounter     Plan: Patient normal job involves being on her feet almost the entire shift and she is not ready to resume that activity.  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  She is making progress and I expect she should have full recovery but will continue to improve with time.  Recheck 2 weeks.  Follow-Up Instructions: No follow-ups on file.   Orders:  No orders of the defined types were placed in this encounter.  No orders of the defined types were placed in this encounter.     Procedures: No procedures performed   Clinical Data: No additional findings.   Subjective: Chief Complaint  Patient presents with  . Left Foot - Follow-up    OTJI 12/08/2019    HPI 54 year old female returns for follow-up after on-the-job injury when she dropped a chip board on her left foot lateral aspect with persistent pain left fourth and fifth metatarsal region.  Date of injury was 12/08/2019.  She has been using a wooden postop shoe she still uses at home and is just started wearing her tennis shoe some.  She still has trouble putting weight on the lateral aspect of her foot her normal job involves a lot of walking.  She is using ibuprofen 800 mg p.o. twice daily and elevates her foot intermittently during the day.  Review of Systems updated unchanged.   Objective: Vital Signs: Ht 5\' 4"  (1.626 m)   Wt 217 lb (98.4 kg)   LMP 01/29/2013   BMI 37.25 kg/m   Physical Exam Constitutional:      Appearance: She is well-developed.  HENT:     Head: Normocephalic.     Right Ear: External ear normal.      Left Ear: External ear normal.  Eyes:     Pupils: Pupils are equal, round, and reactive to light.  Neck:     Thyroid: No thyromegaly.     Trachea: No tracheal deviation.  Cardiovascular:     Rate and Rhythm: Normal rate.  Pulmonary:     Effort: Pulmonary effort is normal.  Abdominal:     Palpations: Abdomen is soft.  Skin:    General: Skin is warm and dry.  Neurological:     Mental Status: She is alert and oriented to person, place, and time.  Psychiatric:        Behavior: Behavior normal.     Ortho Exam patient has mild swelling laterally still over the fourth and fifth toe extreme extreme tenderness with palpation of the fifth toe.  No palpable step-off.  Skin is intact.  She is able to ambulate with her shoes on with slow short stride gait. Specialty Comments:  No specialty comments available.  Imaging: No results found.   PMFS History: Patient Active Problem List   Diagnosis Date Noted  . Foot contusion 12/31/2019  . Essential hypertension 10/12/2015  . 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 Migraine  headache 09/04/2012  . Hyperlipidemia LDL goal <100 05/11/2012  . Vitamin D deficiency 04/11/2011  . Morbid obesity ([place]) 06/01/2007   Past Medical History:  Diagnosis Date  . Asthma   . Back pain   . GERD (gastroesophageal reflux disease)   . Headache(784.0)   . Hypertension   . Migraines   . PONV (postoperative nausea and vomiting)     Family History  Problem Relation Age of Onset  . Prostate cancer Father   . Heart attack Mother   . Hypertension Sister   . Diabetes Sister   . Hypertension Brother   . Hypertension Sister   . Hypertension Brother   . Hypertension Brother   . Other Son        MVA    Past Surgical History:  Procedure Laterality Date  . ABDOMINAL HYSTERECTOMY    . ECTOPIC PREGNANCY SURGERY    . ENDOMETRIAL ABLATION    . POLYPECTOMY     vocal chords  . SALPINGECTOMY  right   unilateral  . VAGINAL HYSTERECTOMY N/A 08/04/2013    Procedure: HYSTERECTOMY VAGINAL;  Surgeon: Florian Buff, MD;  Location: [facility];  Service: Gynecology;  Laterality: N/A;  . vocal chord polyp     Social History   Occupational History  . Not on file  Tobacco Use  . Smoking status: Never Smoker  . Smokeless tobacco: Never Used  Vaping Use  . Vaping Use: Never used  Substance and Sexual Activity  . Alcohol use: No  . Drug use: No  . Sexual activity: Not Currently    Birth control/protection: Surgical    Comment: hyst

## 2020-01-18 ENCOUNTER — Telehealth: Payer: Self-pay | Admitting: Orthopaedic Surgery

## 2020-01-18 NOTE — Telephone Encounter (Signed)
FYI  I faxed last office note and out of work note to [employer].

## 2020-01-18 NOTE — Telephone Encounter (Signed)
Janett Billow called and would like pts notes from her last visit stating that she is out of work for an additional 2 weeks.   [phone] fax

## 2020-01-28 ENCOUNTER — Ambulatory Visit (INDEPENDENT_AMBULATORY_CARE_PROVIDER_SITE_OTHER): Payer: Worker's Compensation | Admitting: Orthopaedic Surgery

## 2020-01-28 ENCOUNTER — Encounter: Payer: Self-pay | Admitting: Orthopaedic Surgery

## 2020-01-28 ENCOUNTER — Ambulatory Visit (HOSPITAL_COMMUNITY): Admission: RE | Admit: 2020-01-28 | Payer: 59 | Source: Ambulatory Visit

## 2020-01-28 ENCOUNTER — Other Ambulatory Visit: Payer: Self-pay

## 2020-01-28 VITALS — BP 149/91 | HR 72 | Ht 64.0 in | Wt 220.0 lb

## 2020-01-28 DIAGNOSIS — S9032XD Contusion of left foot, subsequent encounter: Secondary | ICD-10-CM | POA: Diagnosis not present

## 2020-01-28 NOTE — Progress Notes (Signed)
Office Visit Note   Patient: Bridget Roberts           Date of Birth: [birth year]           MRN: [id number] Visit Date: 01/28/2020              Requested by: Fayrene Helper, [place], [place],   [id number] PCP: Fayrene Helper, MD   Assessment & Plan: Visit Diagnoses:  1. Contusion of left foot, subsequent encounter     Plan: Work slip given for work resumption 01/31/2020 no restrictions.  No impairment signed patient is at [facility] office follow-up as needed.  She is rated today and released.  Follow-Up Instructions: No follow-ups on file.   Orders:  No orders of the defined types were placed in this encounter.  No orders of the defined types were placed in this encounter.     Procedures: No procedures performed   Clinical Data: No additional findings.   Subjective: Chief Complaint  Patient presents with  . Left Foot - Follow-up    OTJI 12/08/2019    HPI 54 year old female returns for follow-up left foot contusion date of injury 10/27-21.  Patient had been out of work she is walking better and feels like she is able to resume work activities.  She is here with a medical case manager Juliann Carrier fax number [phone].  Review of Systems unchanged from previous visits   Objective: Vital Signs: BP (!) 149/91   Pulse 72   Ht 5\' 4"  (1.626 m)   Wt 220 lb (99.8 kg)   LMP 01/29/2013   BMI 37.76 kg/m   Physical Exam Constitutional:      Appearance: She is well-developed.  HENT:     Head: Normocephalic.     Right Ear: External ear normal.     Left Ear: External ear normal.  Eyes:     Pupils: Pupils are equal, round, and reactive to light.  Neck:     Thyroid: No thyromegaly.     Trachea: No tracheal deviation.  Cardiovascular:     Rate and Rhythm: Normal rate.  Pulmonary:     Effort: Pulmonary effort is normal.  Abdominal:     Palpations: Abdomen is soft.  Skin:    General: Skin is warm and dry.  Neurological:      Mental Status: She is alert and oriented to person, place, and time.  Psychiatric:        Mood and Affect: Mood and affect normal.        Behavior: Behavior normal.     Ortho Exam patient ambulates without a limp.  Minimal discomfort with toe walking.  Minimal tenderness laterally over the foot swelling is down no ecchymosis.  Good ankle range of motion pulses are normal. Specialty Comments:  No specialty comments available.  Imaging: No results found.   PMFS History: Patient Active Problem List   Diagnosis Date Noted  . Foot contusion 12/31/2019  . Essential hypertension 10/12/2015  . 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 Migraine headache 09/04/2012  . Hyperlipidemia LDL goal <100 05/11/2012  . Vitamin D deficiency 04/11/2011  . Morbid obesity ([place]) 06/01/2007   Past Medical History:  Diagnosis Date  . Asthma   . Back pain   . GERD (gastroesophageal reflux disease)   . Headache(784.0)   . Hypertension   . Migraines   . PONV (postoperative nausea and vomiting)     Family History  Problem Relation Age of  Onset  . Prostate cancer Father   . Heart attack Mother   . Hypertension Sister   . Diabetes Sister   . Hypertension Brother   . Hypertension Sister   . Hypertension Brother   . Hypertension Brother   . Other Son        MVA    Past Surgical History:  Procedure Laterality Date  . ABDOMINAL HYSTERECTOMY    . ECTOPIC PREGNANCY SURGERY    . ENDOMETRIAL ABLATION    . POLYPECTOMY     vocal chords  . SALPINGECTOMY  right   unilateral  . VAGINAL HYSTERECTOMY N/A 08/04/2013   Procedure: HYSTERECTOMY VAGINAL;  Surgeon: Florian Buff, MD;  Location: [facility];  Service: Gynecology;  Laterality: N/A;  . vocal chord polyp     Social History   Occupational History  . Not on file  Tobacco Use  . Smoking status: Never Smoker  . Smokeless tobacco: Never Used  Vaping Use  . Vaping Use: Never used  Substance and Sexual Activity  . Alcohol use: No  . Drug use: No  . Sexual  activity: Not Currently    Birth control/protection: Surgical    Comment: hyst

## 2020-03-15 ENCOUNTER — Other Ambulatory Visit: Payer: Self-pay

## 2020-03-15 ENCOUNTER — Encounter: Payer: Self-pay | Admitting: Family Medicine

## 2020-03-15 ENCOUNTER — Telehealth (INDEPENDENT_AMBULATORY_CARE_PROVIDER_SITE_OTHER): Payer: 59 | Admitting: Family Medicine

## 2020-03-15 VITALS — BP 135/91

## 2020-03-15 DIAGNOSIS — E559 Vitamin D deficiency, unspecified: Secondary | ICD-10-CM

## 2020-03-15 DIAGNOSIS — R110 Nausea: Secondary | ICD-10-CM

## 2020-03-15 DIAGNOSIS — G43001 Migraine without aura, not intractable, with status migrainosus: Secondary | ICD-10-CM

## 2020-03-15 DIAGNOSIS — I10 Essential (primary) hypertension: Secondary | ICD-10-CM

## 2020-03-15 DIAGNOSIS — Z1231 Encounter for screening mammogram for malignant neoplasm of breast: Secondary | ICD-10-CM

## 2020-03-15 DIAGNOSIS — M5410 Radiculopathy, site unspecified: Secondary | ICD-10-CM

## 2020-03-15 DIAGNOSIS — E785 Hyperlipidemia, unspecified: Secondary | ICD-10-CM

## 2020-03-15 MED ORDER — METHYLPREDNISOLONE ACETATE 80 MG/ML IJ SUSP
80.0000 mg | Freq: Once | INTRAMUSCULAR | Status: AC
  Administered 2020-03-15: 80 mg via INTRAMUSCULAR

## 2020-03-15 MED ORDER — PREDNISONE 5 MG (21) PO TBPK: 5.0000 mg | ORAL_TABLET | ORAL | 0 refills | Status: DC

## 2020-03-15 MED ORDER — KETOROLAC TROMETHAMINE 60 MG/2ML IM SOLN
60.0000 mg | Freq: Once | INTRAMUSCULAR | Status: AC
  Administered 2020-03-15: 60 mg via INTRAMUSCULAR

## 2020-03-15 MED ORDER — ONDANSETRON HCL 4 MG/2ML IJ SOLN
4.0000 mg | Freq: Once | INTRAMUSCULAR | Status: AC
  Administered 2020-03-15: 4 mg via INTRAMUSCULAR

## 2020-03-15 NOTE — Patient Instructions (Addendum)
F/U with MD in 6 weeks call if you need me sooner.  Reevaluate migraine headaches and back pain.  Please come to the office this afternoon for Toradol 60 mg IM Depo-Medrol 80 mg IM and Zofran 4 mg IM for migraine headache and back pain.  Work excuse form January 31 to  return February 7.  Please get labs today when you com to office, CBC, lipid, cmp and EGFr, TSH and vit D  Mammogram is past due please see if this can be done this week.   MRI of lumbar spine is essential to evaluate and manage her back pain.  We will try and get this as soon as possible.  Prednisone Dosepak is prescribed for management of both your migraine headache and back pain.  As discussed you will most likely  be referred to neurosurgery following your MRI based on your symptoms

## 2020-03-16 ENCOUNTER — Encounter: Payer: Self-pay | Admitting: Family Medicine

## 2020-03-16 DIAGNOSIS — M5410 Radiculopathy, site unspecified: Secondary | ICD-10-CM | POA: Insufficient documentation

## 2020-03-16 MED ORDER — CYCLOBENZAPRINE HCL 10 MG PO TABS: 10.0000 mg | ORAL_TABLET | Freq: Every day | ORAL | 3 refills | Status: DC

## 2020-03-16 NOTE — Progress Notes (Signed)
Virtual Visit via video  I connected with Bridget Roberts on 03/16/20 at  1:00 PM EST by video  Location: Patient: home Provider: work     History of Present Illness: C/o disabling migraine headache that started on 03/12/2020 and still persists, feels like a typiccal migraine headache , is in the left  temporal to occipital areas, is  Rated between 7 and 10, has nausea, no relief despite immitrex 1 year h/o increased back and lower extremity pain, left and c/o right leg weakness, has needed MRI but could not get off work, currently working a 7 day week more often than not. No c/o incontinence   Observations/Objective: BP (!) 135/91   LMP 01/29/2013  Good communication with no confusion and intact memory. Alert and oriented x 3 No signs of respiratory distress during speech    Assessment and Plan: Migraine headache Experiencing cluster headache, reports throbbing and nausea for 3 days, unable to work currently She is to come in for toradol 60 mg Im and depo medrol 80 mg IM and also a prednisone dose pack is prescribed Work excuse from 01/31 to return 03/20/2020  Back pain with left-sided radiculopathy Progressive x 1 year, debilitating currently , established arthritis and disc disease  Since 2009, needs MRI, toradol and depo medrol IM in office , also bedtime muscle relaxant, will need neurosurgery eval  Essential hypertension DASH diet and commitment to daily physical activity for a minimum of 30 minutes discussed and encouraged, as a part of hypertension management. The importance of attaining a healthy weight is also discussed.  BP/Weight 03/15/2020 01/28/2020 01/14/2020 12/31/2019 12/29/2019 80/0/3491 08/20/1503  Systolic BP 697 948 - 016 553 748 270  Diastolic BP 91 91 - 90 92 86 88  Wt. (Lbs) - 220 217 217 217 211 208  BMI - 37.76 37.25 37.25 37.25 36.22 35.7   Elevated and uncontrolled from record review, needs in office eval    Hyperlipidemia LDL goal  <100 Hyperlipidemia:Low fat diet discussed and encouraged.   Lipid Panel  Lab Results  Component Value Date   CHOL 214 (H) 01/01/2019   HDL 71 01/01/2019   LDLCALC 121 (H) 01/01/2019   TRIG 109 01/01/2019   CHOLHDL 3.0 01/01/2019     'Updated lab needed     Follow Up Instructions:    I discussed the assessment and treatment plan with the patient. The patient was provided an opportunity to ask questions and all were answered. The patient agreed with the plan and demonstrated an understanding of the instructions.   The patient was advised to call back or seek an in-person evaluation if the symptoms worsen or if the condition fails to improve as anticipated.  I provided 25 minutes of non-face-to-face time during this encounter.   Tula Nakayama, MD

## 2020-03-16 NOTE — Assessment & Plan Note (Signed)
Hyperlipidemia:Low fat diet discussed and encouraged.   Lipid Panel  Lab Results  Component Value Date   CHOL 214 (H) 01/01/2019   HDL 71 01/01/2019   LDLCALC 121 (H) 01/01/2019   TRIG 109 01/01/2019   CHOLHDL 3.0 01/01/2019     'Updated lab needed

## 2020-03-16 NOTE — Assessment & Plan Note (Signed)
Progressive x 1 year, debilitating currently , established arthritis and disc disease  Since 2009, needs MRI, toradol and depo medrol IM in office , also bedtime muscle relaxant, will need neurosurgery eval

## 2020-03-16 NOTE — Assessment & Plan Note (Signed)
DASH diet and commitment to daily physical activity for a minimum of 30 minutes discussed and encouraged, as a part of hypertension management. The importance of attaining a healthy weight is also discussed.  BP/Weight 03/15/2020 01/28/2020 01/14/2020 12/31/2019 12/29/2019 92/02/1939 08/14/812  Systolic BP 481 856 - 314 970 263 785  Diastolic BP 91 91 - 90 92 86 88  Wt. (Lbs) - 220 217 217 217 211 208  BMI - 37.76 37.25 37.25 37.25 36.22 35.7   Elevated and uncontrolled from record review, needs in office eval

## 2020-03-16 NOTE — Assessment & Plan Note (Signed)
Experiencing cluster headache, reports throbbing and nausea for 3 days, unable to work currently She is to come in for toradol 60 mg Im and depo medrol 80 mg IM and also a prednisone dose pack is prescribed Work excuse from 01/31 to return 03/20/2020

## 2020-03-17 ENCOUNTER — Other Ambulatory Visit: Payer: Self-pay

## 2020-03-17 ENCOUNTER — Ambulatory Visit
Admission: RE | Admit: 2020-03-17 | Discharge: 2020-03-17 | Disposition: A | Discharge status: Home / Self Care | Payer: 59 | Source: Ambulatory Visit | Attending: Family Medicine | Admitting: Family Medicine

## 2020-03-17 DIAGNOSIS — M5410 Radiculopathy, site unspecified: Secondary | ICD-10-CM

## 2020-03-18 ENCOUNTER — Encounter: Payer: Self-pay | Admitting: Family Medicine

## 2020-03-18 LAB — CMP14+EGFR
ALT: 18 IU/L (ref 0–32)
AST: 19 IU/L (ref 0–40)
Albumin/Globulin Ratio: 1.3 (ref 1.2–2.2)
Albumin: 3.9 g/dL (ref 3.8–4.9)
Alkaline Phosphatase: 82 IU/L (ref 44–121)
BUN/Creatinine Ratio: 13 (ref 9–23)
BUN: 11 mg/dL (ref 6–24)
Bilirubin Total: 0.3 mg/dL (ref 0.0–1.2)
CO2: 25 mmol/L (ref 20–29)
Calcium: 9.5 mg/dL (ref 8.7–10.2)
Chloride: 102 mmol/L (ref 96–106)
Creatinine, Ser: 0.83 mg/dL (ref 0.57–1.00)
GFR calc Af Amer: 91 mL/min/{1.73_m2} (ref >59)
GFR calc non Af Amer: 79 mL/min/{1.73_m2} (ref >59)
Globulin, Total: 2.9 g/dL (ref 1.5–4.5)
Glucose: 82 mg/dL (ref 65–99)
Potassium: 4.3 mmol/L (ref 3.5–5.2)
Sodium: 141 mmol/L (ref 134–144)
Total Protein: 6.8 g/dL (ref 6.0–8.5)

## 2020-03-18 LAB — LIPID PANEL
Chol/HDL Ratio: 3 ratio (ref 0.0–4.4)
Cholesterol, Total: 216 mg/dL — ABNORMAL HIGH (ref 100–199)
HDL: 72 mg/dL (ref >39)
LDL Chol Calc (NIH): 131 mg/dL — ABNORMAL HIGH (ref 0–99)
Triglycerides: 73 mg/dL (ref 0–149)
VLDL Cholesterol Cal: 13 mg/dL (ref 5–40)

## 2020-03-18 LAB — VITAMIN D 25 HYDROXY (VIT D DEFICIENCY, FRACTURES): Vit D, 25-Hydroxy: 32.7 ng/mL (ref 30.0–100.0)

## 2020-03-18 LAB — CBC
Hematocrit: 43.3 % (ref 34.0–46.6)
Hemoglobin: 14.5 g/dL (ref 11.1–15.9)
MCH: 28.3 pg (ref 26.6–33.0)
MCHC: 33.5 g/dL (ref 31.5–35.7)
MCV: 85 fL (ref 79–97)
Platelets: 312 10*3/uL (ref 150–450)
RBC: 5.12 x10E6/uL (ref 3.77–5.28)
RDW: 13.3 % (ref 11.7–15.4)
WBC: 7.4 10*3/uL (ref 3.4–10.8)

## 2020-03-18 LAB — TSH: TSH: 2.14 u[IU]/mL (ref 0.450–4.500)

## 2020-03-23 ENCOUNTER — Telehealth: Payer: Self-pay

## 2020-03-23 DIAGNOSIS — M5410 Radiculopathy, site unspecified: Secondary | ICD-10-CM

## 2020-03-23 NOTE — Telephone Encounter (Signed)
-----   Message from Fayrene Helper, MD sent at 03/18/2020  6:26 AM EST ----- Labs excellent except cholesterol slightly high, needs to reduce fried and fatty foods

## 2020-03-28 ENCOUNTER — Ambulatory Visit (HOSPITAL_COMMUNITY): Payer: 59

## 2020-04-17 DIAGNOSIS — M5126 Other intervertebral disc displacement, lumbar region: Secondary | ICD-10-CM | POA: Insufficient documentation

## 2020-04-17 DIAGNOSIS — Z6837 Body mass index (BMI) 37.0-37.9, adult: Secondary | ICD-10-CM | POA: Insufficient documentation

## 2020-04-19 ENCOUNTER — Telehealth: Payer: Self-pay

## 2020-04-19 NOTE — Telephone Encounter (Signed)
FMLA forms received  Copied Noted Sleeved

## 2020-04-25 ENCOUNTER — Telehealth: Payer: Self-pay

## 2020-04-25 NOTE — Telephone Encounter (Signed)
FMLA   COPIED NOTED SLEEVED 

## 2020-04-27 ENCOUNTER — Ambulatory Visit: Payer: 59 | Admitting: Family Medicine

## 2020-05-02 ENCOUNTER — Other Ambulatory Visit: Payer: Self-pay

## 2020-05-02 ENCOUNTER — Ambulatory Visit: Payer: 59 | Attending: Neurosurgery

## 2020-05-02 ENCOUNTER — Telehealth (INDEPENDENT_AMBULATORY_CARE_PROVIDER_SITE_OTHER): Payer: 59 | Admitting: Internal Medicine

## 2020-05-02 ENCOUNTER — Encounter: Payer: Self-pay | Admitting: Internal Medicine

## 2020-05-02 DIAGNOSIS — J0140 Acute pansinusitis, unspecified: Secondary | ICD-10-CM | POA: Insufficient documentation

## 2020-05-02 DIAGNOSIS — R262 Difficulty in walking, not elsewhere classified: Secondary | ICD-10-CM | POA: Diagnosis present

## 2020-05-02 DIAGNOSIS — G8929 Other chronic pain: Secondary | ICD-10-CM | POA: Insufficient documentation

## 2020-05-02 DIAGNOSIS — G43001 Migraine without aura, not intractable, with status migrainosus: Secondary | ICD-10-CM

## 2020-05-02 DIAGNOSIS — M5416 Radiculopathy, lumbar region: Secondary | ICD-10-CM

## 2020-05-02 DIAGNOSIS — M6281 Muscle weakness (generalized): Secondary | ICD-10-CM

## 2020-05-02 DIAGNOSIS — M5442 Lumbago with sciatica, left side: Secondary | ICD-10-CM | POA: Insufficient documentation

## 2020-05-02 NOTE — Progress Notes (Signed)
Virtual Visit via Telephone Note   This visit type was conducted due to national recommendations for restrictions regarding the COVID-19 Pandemic (e.g. social distancing) in an effort to limit this patient's exposure and mitigate transmission in our community.  Due to her co-morbid illnesses, this patient is at least at moderate risk for complications without adequate follow up.  This format is felt to be most appropriate for this patient at this time.  The patient did not have access to video technology/had technical difficulties with video requiring transitioning to audio format only (telephone).  All issues noted in this document were discussed and addressed.  No physical exam could be performed with this format.    Evaluation Performed:  Follow-up visit  Date:  05/02/2020   ID:  Roberts, Bridget [birth year], MRN [id number]  Patient Location: Home Provider Location: Office/Clinic  Participants: Patient Location of Patient: Home Location of Provider: Telehealth Consent was obtain for visit to be over via telehealth. I verified that I am speaking with the correct person using two identifiers.  PCP:  Bridget Helper, MD   Chief Complaint: Back pain, FMLA paperwork  History of Present Illness:    Bridget Roberts is a 55 y.o. female who has a televisit for c/o chronic back pain, which is constant, sharp and worse with movement. She had a local injection in her back and has been getting physical therapy.  She also has a h/o migraine, for which she takes Sumatriptan. She also needs office visit/follow up for HTN and requests FMLA paperwork.  The patient does not have symptoms concerning for COVID-19 infection (fever, chills, cough, or new shortness of breath).   Past Medical, Surgical, Social History, Allergies, and Medications have been Reviewed.  Past Medical History:  Diagnosis Date  . Asthma   . Back pain   . GERD (gastroesophageal reflux disease)   . Headache(784.0)    . Hypertension   . Migraines   . PONV (postoperative nausea and vomiting)    Past Surgical History:  Procedure Laterality Date  . ABDOMINAL HYSTERECTOMY    . ECTOPIC PREGNANCY SURGERY    . ENDOMETRIAL ABLATION    . POLYPECTOMY     vocal chords  . SALPINGECTOMY  right   unilateral  . VAGINAL HYSTERECTOMY N/A 08/04/2013   Procedure: HYSTERECTOMY VAGINAL;  Surgeon: Bridget Buff, MD;  Location: [facility];  Service: Gynecology;  Laterality: N/A;  . vocal chord polyp       Current Meds  Medication Sig  . albuterol (PROVENTIL HFA;VENTOLIN HFA) 108 (90 Base) MCG/ACT inhaler Inhale 2 puffs into the lungs every 6 (six) hours as needed for wheezing or shortness of breath.  . cyclobenzaprine (FLEXERIL) 10 MG tablet Take 1 tablet (10 mg total) by mouth at bedtime.  Marland Kitchen ibuprofen (ADVIL) 800 MG tablet Take 1 tablet (800 mg total) by mouth every 12 (twelve) hours as needed.  . ondansetron (ZOFRAN) 4 MG tablet Take 1 tablet (4 mg total) by mouth every 8 (eight) hours as needed for nausea or vomiting.  . pantoprazole (PROTONIX) 40 MG tablet TAKE ONE TABLET DAILY FOR 2 WEEKS, THEN AS NEEDED, ON DAYS WHEN YOU TAKE IBUPROFEN  . predniSONE (STERAPRED UNI-PAK 21 TAB) 5 MG (21) TBPK tablet Take 1 tablet (5 mg total) by mouth as directed. Use as directed  . SUMAtriptan (IMITREX) 100 MG tablet One tablet at headache onset May repeat in 2 hours if headache persists or recurs. Maximum  Use is twice  weekly  . SUMAtriptan (IMITREX) 100 MG tablet Take one tablet at headache onset ,May repeat once in 2 hours if headache persists or recurs.Maximum use is two times per week  . Vitamin D, Ergocalciferol, (DRISDOL) 1.25 MG (50000 UNIT) CAPS capsule TAKE 1 CAPSULE (50,000 UNITS TOTAL) BY MOUTH ONCE A WEEK. ONE CAPSULE ONCE WEEKLY     Allergies:   Penicillins   ROS:   Please see the history of present illness.     All other systems reviewed and are negative.   Labs/Other Tests and Data Reviewed:    Recent  Labs: 03/17/2020: ALT 18; BUN 11; Creatinine, Ser 0.83; Hemoglobin 14.5; Platelets 312; Potassium 4.3; Sodium 141; TSH 2.140   Recent Lipid Panel Lab Results  Component Value Date/Time   CHOL 216 (H) 03/17/2020 08:47 AM   TRIG 73 03/17/2020 08:47 AM   HDL 72 03/17/2020 08:47 AM   CHOLHDL 3.0 03/17/2020 08:47 AM   CHOLHDL 3.0 01/01/2019 12:03 PM   LDLCALC 131 (H) 03/17/2020 08:47 AM   LDLCALC 121 (H) 01/01/2019 12:03 PM    Wt Readings from Last 3 Encounters:  01/28/20 220 lb (99.8 kg)  01/14/20 217 lb (98.4 kg)  12/31/19 217 lb (98.4 kg)      ASSESSMENT & PLAN:    Lumbar radiculopathy S/p steroid injection Follows up with Orthopedic surgeon Continue Physical therapy  Migraine Sumatriptan PRN  FMLA paperwork will be filled and given to patient.  Time:   Today, I have spent 9 minutes reviewing the chart, including problem list, medications, and with the patient with telehealth technology discussing the above problems.   Medication Adjustments/Labs and Tests Ordered: Current medicines are reviewed at length with the patient today.  Concerns regarding medicines are outlined above.   Tests Ordered: No orders of the defined types were placed in this encounter.   Medication Changes: No orders of the defined types were placed in this encounter.    Note: This dictation was prepared with Dragon dictation along with smaller phrase technology. Similar sounding words can be transcribed inadequately or may not be corrected upon review. Any transcriptional errors that result from this process are unintentional.      Disposition:  Follow up  Signed, Lindell Spar, MD  05/02/2020 5:03 PM     [facility]

## 2020-05-02 NOTE — Patient Instructions (Signed)
Please continue to participate in physical therapy.  Your paperwork will be completed and will be ready to be picked up.

## 2020-05-03 NOTE — Therapy (Signed)
[place], [place], [address] Phone: [phone]   Fax:  [phone]  Physical Therapy Evaluation  Patient Details  Name: Bridget Roberts MRN: [id number] Date of Birth: August 03, 1965 Referring Provider (PT): Consuella Lose, MD   Encounter Date: 05/02/2020   PT End of Session - 05/02/20 1626    Visit Number 1    Number of Visits 17    Date for PT Re-Evaluation 06/27/20    Authorization Type UHC    PT Start Time 69    PT Stop Time 4818   time limited as she took phone call from MD in middle of session, (40 minutes total of PT Time)   PT Time Calculation (min) 45 min    Activity Tolerance Patient tolerated treatment well    Behavior During Therapy [facility] for tasks assessed/performed           Past Medical History:  Diagnosis Date  . Asthma   . Back pain   . GERD (gastroesophageal reflux disease)   . Headache(784.0)   . Hypertension   . Migraines   . PONV (postoperative nausea and vomiting)     Past Surgical History:  Procedure Laterality Date  . ABDOMINAL HYSTERECTOMY    . ECTOPIC PREGNANCY SURGERY    . ENDOMETRIAL ABLATION    . POLYPECTOMY     vocal chords  . SALPINGECTOMY  right   unilateral  . VAGINAL HYSTERECTOMY N/A 08/04/2013   Procedure: HYSTERECTOMY VAGINAL;  Surgeon: Florian Buff, MD;  Location: [facility];  Service: Gynecology;  Laterality: N/A;  . vocal chord polyp      There were no vitals filed for this visit.    Subjective Assessment - 05/02/20 1632    Subjective Patient reports she is having leg and back problems due to the sciatic nerve that started about a year ago that was likely due to job related lifting, pulling, and prolonged standing activity. At first she thought it was from her hip due to sleep positions. She had an epidural injection last week and is feeling mostly numbness from this,but doesn't think it has helped with her pain. Patient reports the pain can go from  the lower back to the front/side of the Lt leg that normally does not radiate past the knee, but does radiate to the foot occasionally when she sleeps. She reports Lt groin pain as well. She reports she gets up a lot to use the bathrrom at night, but is unsure if increased frequency in urination began when her back pain began. She denies saddle parathesia. Patient reports  a previous back injury in 2009 where a freight elevator door fell on her.    Limitations Lifting;Standing;Walking;House hold activities    How long can you sit comfortably? "Sitting is kind of a relief"    How long can you stand comfortably? "I guess I never really think about it, it aches all the time."    How long can you walk comfortably? "It depends, sometimes brisk walking feels better."    Diagnostic tests IMPRESSION:  1. Left foraminal disc protrusion with progression from the prior  study. This is causing mild displacement left L3 nerve root in the  foramen.  2. Mild left subarticular stenosis L4-5 with mild progression  3. Mild to moderate foraminal stenosis bilaterally L5-S1 without  interval change.    Patient Stated Goals "not to be in pain and not to walk with a limp"    Currently in Pain?  Yes    Pain Score 2     Pain Location Leg    Pain Orientation Left    Pain Descriptors / Indicators Aching    Pain Type Chronic pain    Pain Radiating Towards LLE    Pain Onset More than a month ago    Pain Frequency Intermittent    Aggravating Factors  standing, walking    Pain Relieving Factors sitting, heat    Effect of Pain on Daily Activities difficulty with standing and walking activity              [facility] PT Assessment - 05/03/20 0001      Assessment   Medical Diagnosis M54.16 Lumbar radiculopathy, chronic    Referring Provider (PT) Consuella Lose, MD    Onset Date/Surgical Date --   >1 year   Hand Dominance Right    Next MD Visit nothing scheduled    Prior Therapy after injury in 2009      Precautions    Precautions None      Restrictions   Weight Bearing Restrictions No      Balance Screen   Has the patient fallen in the past 6 months No      [place] residence    Living Arrangements Alone    Additional Comments stairs outside      Prior Function   Level of Independence Independent    Magazine features editor, lifting, carrying      Cognition   Overall Cognitive Status Within Functional Limits for tasks assessed      Observation/Other Assessments   Focus on Therapeutic Outcomes (FOTO)  48% function to 60% predicted      Posture/Postural Control   Posture/Postural Control Postural limitations    Postural Limitations Rounded Shoulders;Forward head;Anterior pelvic tilt      AROM   Lumbar Flexion WFL    Lumbar Extension WFL pulling in thigh    Lumbar - Right Side Bend WFL    Lumbar - Left Side Bend WFL tightness low back    Lumbar - Right Rotation WFL    Lumbar - Left Rotation WFL pulling in thigh      Strength   Right Hip Flexion 5/5    Right Hip Extension 4-/5    Right Hip ABduction 5/5    Left Hip Flexion 3+/5    Left Hip Extension 4-/5    Left Hip ABduction 4-/5    Right Knee Flexion 5/5    Right Knee Extension 5/5    Left Knee Flexion 5/5    Left Knee Extension 5/5    Right Ankle Dorsiflexion 5/5    Right Ankle Plantar Flexion 5/5    Right Ankle Inversion 5/5    Right Ankle Eversion 5/5    Left Ankle Dorsiflexion 5/5    Left Ankle Plantar Flexion 5/5    Left Ankle Inversion 5/5    Left Ankle Eversion 5/5      Flexibility   Hamstrings WNL    Quadriceps PKB 90 degrees bilaterally    Piriformis Lt 50% limitation Rt 25% limitation      Palpation   Palpation comment tautness and palpable tenderness Lt glute max/med      Special Tests   Other special tests (-) SLR bilaterally; (+) FABER Lt      Ambulation/Gait   Gait Pattern Step-through pattern;Trendelenburg;Decreased stance time - left  Objective measurements completed on examination: See above findings.       [facility] Adult PT Treatment/Exercise - 05/03/20 0001      Self-Care   Self-Care Other Self-Care Comments    Other Self-Care Comments  see patient education                  PT Education - 05/02/20 1650    Education Details education on current condition, POC, HEP.    Person(s) Educated Patient    Methods Explanation;Demonstration;Verbal cues;Handout    Comprehension Verbalized understanding;Returned demonstration;Verbal cues required;Need further instruction            PT Short Term Goals - 05/02/20 1646      PT SHORT TERM GOAL #1   Title Patient will be independent with initial HEP.    Baseline Issued at eval.    Time 2    Period Weeks    Status New    Target Date 05/16/20      PT SHORT TERM GOAL #2   Title Therapist will review FOTO score and anticipated progress.    Baseline FOTO captured at eval.    Time 1    Period Weeks    Status New    Target Date 05/09/20      PT SHORT TERM GOAL #3   Title Patient will demonstrate at least 80 degrees with prone knee bend to decrease stress on low back.    Baseline 90 degrees bilaterally    Time 3    Period Weeks    Status New    Target Date 05/24/20             PT Long Term Goals - 05/03/20 1191      PT LONG TERM GOAL #1   Title Patient will demonstrate at least 4+/5 in bilateral hip musculature to improve stability about the chain with prolonged walking and standing.    Baseline see flowsheet    Time 8    Period Weeks    Status New    Target Date 06/28/20      PT LONG TERM GOAL #2   Title Patient will score at least 60% function on FOTO to signify clinically meaningful improvement in functional abilities.    Baseline 48%    Time 8    Period Weeks    Status New    Target Date 06/28/20      PT LONG TERM GOAL #3   Title Patient will demonstrate normalized gait pattern.    Baseline antalgic gait     Time 8    Period Weeks    Status New    Target Date 06/28/20      PT LONG TERM GOAL #4   Title Patient will self report ability to tolerate 30 minutes of standing activity with minimal pain (<3/10) to improve tolerance to work specific activity.    Baseline immediate pain with standing    Time 8    Period Weeks    Status New    Target Date 06/28/20                  Plan - 05/02/20 1650    Clinical Impression Statement Patient is a 55 y/o female with chief complaint of chronic low back pain with LLE radicular symptoms that worsen with prolonged standing and walking. She has a previous history of a back injury in 2009, though reports this current pain began about a year ago of insidious onset. Though she has  good lumbar AROM she reports pain in the low back with left lateral flexion and pain in the groin region with left rotation and extension. She has notable Lt hip weakness, limited quadricep and piriformis flexibility (Lt>Rt), postural dysfunction, and gait abnormalities. She will benefit from skilled PT to address above stated deficits in order to return to optimal function.    Personal Factors and Comorbidities Time since onset of injury/illness/exacerbation    Examination-Activity Limitations Lift;Stand;Locomotion Level    Examination-Participation Restrictions Occupation;Shop    Stability/Clinical Decision Making Stable/Uncomplicated    Clinical Decision Making Low    Rehab Potential Good    PT Frequency 2x / week    PT Duration 8 weeks    PT Treatment/Interventions ADLs/Self Care Home Management;Cryotherapy;Electrical Stimulation;Moist Heat;Traction;Therapeutic activities;Therapeutic exercise;Neuromuscular re-education;Patient/family education;Manual techniques;Passive range of motion;Dry needling;Taping    PT Next Visit Plan Assess groin pain further (hip joint mobility, femoral nerve tension test), gait training, review and update HEP. Review FOTO    PT Home Exercise Plan  Access Code: [email or web address]    Consulted and Agree with Plan of Care Patient           Patient will benefit from skilled therapeutic intervention in order to improve the following deficits and impairments:  Abnormal gait,Pain,Decreased strength,Difficulty walking,Postural dysfunction,Impaired flexibility  Visit Diagnosis: Chronic left-sided low back pain with left-sided sciatica  Muscle weakness (generalized)  Difficulty in walking, not elsewhere classified     Problem List Patient Active Problem List   Diagnosis Date Noted  . Acute pansinusitis 05/02/2020  . Body mass index (BMI) 37.0-37.9, adult 04/17/2020  . 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 Back pain with left-sided radiculopathy 03/16/2020  . Foot contusion 12/31/2019  . Pain in finger of right hand 04/14/2019  . Lumbar radiculopathy 01/02/2019  . Hypertensive disorder 10/12/2015  . 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 Migraine headache 09/04/2012  . Hyperlipidemia LDL goal <100 05/11/2012  . Vitamin D deficiency 04/11/2011  . Morbid obesity ([place]) 06/01/2007   Gwendolyn Grant, PT, DPT, ATC 05/03/20 6:44 AM [facility] [address] [place], [place], [address] Phone: [phone]   Fax:  [phone]  Name: Bridget Roberts MRN: [id number] Date of Birth: 1965-02-13

## 2020-05-04 DIAGNOSIS — Z8489 Family history of other specified conditions: Secondary | ICD-10-CM

## 2020-05-05 NOTE — Telephone Encounter (Signed)
Completed Pt picked up

## 2020-05-08 ENCOUNTER — Telehealth: Payer: Self-pay

## 2020-05-08 NOTE — Telephone Encounter (Signed)
FMLA - migranes FMLA - back pain  Copied Sleeved Noted

## 2020-05-09 ENCOUNTER — Telehealth: Payer: Self-pay

## 2020-05-09 ENCOUNTER — Ambulatory Visit: Payer: 59

## 2020-05-09 NOTE — Telephone Encounter (Signed)
Patient called asking if all her FMLA forms were completed.  Section # 8 on back FMLA forms were incomplete.  Patient demanded that she needed these forms done today if not she will get so many points and she will loose her job.  She has physicial therapy scheduled for Thursday, 3/31 she will have to cancel if these forms are not filled out.  Patient said she must have FMLA forms filled out for each separate visit pertaining to body part.  Patient made it very clear that all forms must be completed ASAP and the tone of voice patient had requesting these forms.    The FMLA back forms were completed, patient notified, there will be no charge again for these forms.    The FMLA forms for migranes are still pending.

## 2020-05-09 NOTE — Telephone Encounter (Signed)
Informed pt fmla back forms are ready for pickup.

## 2020-05-09 NOTE — Telephone Encounter (Signed)
error 

## 2020-05-11 ENCOUNTER — Ambulatory Visit: Payer: 59

## 2020-05-11 ENCOUNTER — Other Ambulatory Visit: Payer: Self-pay

## 2020-05-11 DIAGNOSIS — M5442 Lumbago with sciatica, left side: Secondary | ICD-10-CM | POA: Diagnosis not present

## 2020-05-11 DIAGNOSIS — R262 Difficulty in walking, not elsewhere classified: Secondary | ICD-10-CM

## 2020-05-11 DIAGNOSIS — G8929 Other chronic pain: Secondary | ICD-10-CM

## 2020-05-11 DIAGNOSIS — M6281 Muscle weakness (generalized): Secondary | ICD-10-CM

## 2020-05-11 NOTE — Therapy (Signed)
[place], [place], [address] Phone: [phone]   Fax:  [phone]  Physical Therapy Treatment  Patient Details  Name: Bridget Roberts MRN: [id number] Date of Birth: 1965-04-06 Referring Provider (PT): Consuella Lose, MD   Encounter Date: 05/11/2020   PT End of Session - 05/11/20 1659    Visit Number 2    Number of Visits 17    Date for PT Re-Evaluation 06/27/20    Authorization Type UHC    PT Start Time 1700    PT Stop Time 1742    PT Time Calculation (min) 42 min    Activity Tolerance Patient tolerated treatment well;No increased pain    Behavior During Therapy WFL for tasks assessed/performed           Past Medical History:  Diagnosis Date  . Asthma   . Back pain   . GERD (gastroesophageal reflux disease)   . Headache(784.0)   . Hypertension   . Migraines   . PONV (postoperative nausea and vomiting)     Past Surgical History:  Procedure Laterality Date  . ABDOMINAL HYSTERECTOMY    . ECTOPIC PREGNANCY SURGERY    . ENDOMETRIAL ABLATION    . POLYPECTOMY     vocal chords  . SALPINGECTOMY  right   unilateral  . VAGINAL HYSTERECTOMY N/A 08/04/2013   Procedure: HYSTERECTOMY VAGINAL;  Surgeon: Florian Buff, MD;  Location: [facility];  Service: Gynecology;  Laterality: N/A;  . vocal chord polyp      There were no vitals filed for this visit.   Subjective Assessment - 05/11/20 1700    Subjective Pt presents to PT with no current reports of pain or discomfort. She does note that she did have some discomfort during work hours earlier, but this decreased over time. Pt has been compliant with her HEP with no adverse effect. Pt is ready to begin PT treatment at this time.    Currently in Pain? No/denies    Pain Score 0-No pain              [facility] PT Assessment - 05/11/20 0001      Assessment   Medical Diagnosis hip      Special Tests    Special Tests Hip Special Tests      Hip  Scouring   Findings Negative               [facility] Adult PT Treatment/Exercise - 05/11/20 0001      Exercises   Exercises --      Lumbar Exercises: Stretches   Double Knee to Chest Stretch 2 reps;30 seconds    Lower Trunk Rotation 5 reps;10 seconds    Figure 4 Stretch Supine;2 reps;30 seconds      Lumbar Exercises: Seated   Sit to Stand 20 reps    Other Seated Lumbar Exercises --   seated sciatic nerve glide 2x15 ea     Lumbar Exercises: Supine   Pelvic Tilt 2 reps;10 reps;5 seconds    Pelvic Tilt Limitations PPT w/ ball squeeze 2x5 - 5 sec hold    Bridge 20 reps;3 seconds      Knee/Hip Exercises: Aerobic   Nustep lvl 6 x 5 min while taking subjective              PT Education - 05/11/20 1749    Education Details education on new HEP exercises    Person(s) Educated Patient    Methods Explanation;Demonstration;Verbal cues;Handout  Comprehension Verbalized understanding;Returned demonstration            PT Short Term Goals - 05/02/20 1646      PT SHORT TERM GOAL #1   Title Patient will be independent with initial HEP.    Baseline Issued at eval.    Time 2    Period Weeks    Status New    Target Date 05/16/20      PT SHORT TERM GOAL #2   Title Therapist will review FOTO score and anticipated progress.    Baseline FOTO captured at eval.    Time 1    Period Weeks    Status New    Target Date 05/09/20      PT SHORT TERM GOAL #3   Title Patient will demonstrate at least 80 degrees with prone knee bend to decrease stress on low back.    Baseline 90 degrees bilaterally    Time 3    Period Weeks    Status New    Target Date 05/24/20             PT Long Term Goals - 05/03/20 8756      PT LONG TERM GOAL #1   Title Patient will demonstrate at least 4+/5 in bilateral hip musculature to improve stability about the chain with prolonged walking and standing.    Baseline see flowsheet    Time 8    Period Weeks    Status New    Target Date 06/28/20       PT LONG TERM GOAL #2   Title Patient will score at least 60% function on FOTO to signify clinically meaningful improvement in functional abilities.    Baseline 48%    Time 8    Period Weeks    Status New    Target Date 06/28/20      PT LONG TERM GOAL #3   Title Patient will demonstrate normalized gait pattern.    Baseline antalgic gait    Time 8    Period Weeks    Status New    Target Date 06/28/20      PT LONG TERM GOAL #4   Title Patient will self report ability to tolerate 30 minutes of standing activity with minimal pain (<3/10) to improve tolerance to work specific activity.    Baseline immediate pain with standing    Time 8    Period Weeks    Status New    Target Date 06/28/20                 Plan - 05/11/20 1750    Clinical Impression Statement Pt was again able to complete all prescribed exercises with no adverse effect or increase in pain. She responded well to lumbar AROM stretches and general proximal hip strenghtening. L hip scour was not provocative for pain or discomfort. Pt continues to benefit from skilled PT services with focus on progressing neutral spine exercises and proximal hip strength. Will continue per POC as prescribed.    PT Treatment/Interventions ADLs/Self Care Home Management;Cryotherapy;Electrical Stimulation;Moist Heat;Traction;Therapeutic activities;Therapeutic exercise;Neuromuscular re-education;Patient/family education;Manual techniques;Passive range of motion;Dry needling;Taping    PT Next Visit Plan continue to progress core and proximal hip strengthening as tolerated    PT Home Exercise Plan Access Code: [email or web address]           Patient will benefit from skilled therapeutic intervention in order to improve the following deficits and impairments:     Visit Diagnosis: Chronic left-sided low back pain  with left-sided sciatica  Muscle weakness (generalized)  Difficulty in walking, not elsewhere classified     Problem  List Patient Active Problem List   Diagnosis Date Noted  . Acute pansinusitis 05/02/2020  . Body mass index (BMI) 37.0-37.9, adult 04/17/2020  . 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 Back pain with left-sided radiculopathy 03/16/2020  . Foot contusion 12/31/2019  . Pain in finger of right hand 04/14/2019  . Lumbar radiculopathy 01/02/2019  . Hypertensive disorder 10/12/2015  . 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 Migraine headache 09/04/2012  . Hyperlipidemia LDL goal <100 05/11/2012  . Vitamin D deficiency 04/11/2011  . Morbid obesity ([place]) 06/01/2007    Ward Chatters, PT, DPT 05/11/20 6:00 PM  [facility] [address] [place], [place], [address] Phone: [phone]   Fax:  [phone]  Name: Bridget Roberts MRN: [id number] Date of Birth: [birth year]

## 2020-05-16 ENCOUNTER — Ambulatory Visit: Payer: 59 | Attending: Neurosurgery

## 2020-05-16 ENCOUNTER — Other Ambulatory Visit: Payer: Self-pay

## 2020-05-16 DIAGNOSIS — M5442 Lumbago with sciatica, left side: Secondary | ICD-10-CM | POA: Insufficient documentation

## 2020-05-16 DIAGNOSIS — M6281 Muscle weakness (generalized): Secondary | ICD-10-CM | POA: Diagnosis present

## 2020-05-16 DIAGNOSIS — R262 Difficulty in walking, not elsewhere classified: Secondary | ICD-10-CM | POA: Insufficient documentation

## 2020-05-16 DIAGNOSIS — G8929 Other chronic pain: Secondary | ICD-10-CM | POA: Diagnosis present

## 2020-05-16 NOTE — Therapy (Signed)
[place], [place], [address] Phone: [phone]   Fax:  [phone]  Physical Therapy Treatment  Patient Details  Name: Bridget Roberts MRN: [id number] Date of Birth: August 31, 1965 Referring Provider (PT): Consuella Lose, MD   Encounter Date: 05/16/2020   PT End of Session - 05/16/20 1858    Visit Number 3    Number of Visits 17    Date for PT Re-Evaluation 06/27/20    Authorization Type UHC    PT Start Time 1847    PT Stop Time 1929    PT Time Calculation (min) 42 min    Activity Tolerance Patient tolerated treatment well    Behavior During Therapy [facility] for tasks assessed/performed           Past Medical History:  Diagnosis Date  . Asthma   . Back pain   . GERD (gastroesophageal reflux disease)   . Headache(784.0)   . Hypertension   . Migraines   . PONV (postoperative nausea and vomiting)     Past Surgical History:  Procedure Laterality Date  . ABDOMINAL HYSTERECTOMY    . ECTOPIC PREGNANCY SURGERY    . ENDOMETRIAL ABLATION    . POLYPECTOMY     vocal chords  . SALPINGECTOMY  right   unilateral  . VAGINAL HYSTERECTOMY N/A 08/04/2013   Procedure: HYSTERECTOMY VAGINAL;  Surgeon: Florian Buff, MD;  Location: [facility];  Service: Gynecology;  Laterality: N/A;  . vocal chord polyp      There were no vitals filed for this visit.   Subjective Assessment - 05/16/20 1849    Subjective I"m feeling pretty good. Had some tightness along the Lt side earlier today.    Currently in Pain? No/denies                             [facility] Adult PT Treatment/Exercise - 05/16/20 0001      Lumbar Exercises: Stretches   Double Knee to Chest Stretch Limitations 5 sec hold 10 reps    Quad Stretch 60 seconds    Quad Stretch Limitations bilateral    Figure 4 Stretch 30 seconds    Figure 4 Stretch Limitations x2; bilateral    Other Lumbar Stretch Exercise 3 way stability ball rollout 2  minutes      Lumbar Exercises: Seated   Other Seated Lumbar Exercises seated march 2 x 10      Lumbar Exercises: Supine   Pelvic Tilt 10 reps    Pelvic Tilt Limitations PPT    Bent Knee Raise 10 reps    Bent Knee Raise Limitations x 2    Bridge 10 reps    Bridge Limitations x2; with PPT    Straight Leg Raise --    Straight Leg Raises Limitations 2 x 8      Lumbar Exercises: Sidelying   Hip Abduction 10 reps    Hip Abduction Limitations x 2 bilateral                  PT Education - 05/16/20 1910    Education Details Reviewed FOTO score and anticipated progress.    Person(s) Educated Patient    Methods Explanation    Comprehension Verbalized understanding            PT Short Term Goals - 05/16/20 1909      PT SHORT TERM GOAL #1   Title Patient will be independent with  initial HEP.    Time 2    Period Weeks    Status Achieved    Target Date 05/16/20      PT SHORT TERM GOAL #2   Title Therapist will review FOTO score and anticipated progress.    Baseline reviewed    Time 1    Period Weeks    Status Achieved    Target Date 05/09/20      PT SHORT TERM GOAL #3   Title Patient will demonstrate at least 80 degrees with prone knee bend to decrease stress on low back.    Baseline 90 degrees bilaterally    Time 3    Period Weeks    Status Unable to assess    Target Date 05/24/20             PT Long Term Goals - 05/03/20 3338      PT LONG TERM GOAL #1   Title Patient will demonstrate at least 4+/5 in bilateral hip musculature to improve stability about the chain with prolonged walking and standing.    Baseline see flowsheet    Time 8    Period Weeks    Status New    Target Date 06/28/20      PT LONG TERM GOAL #2   Title Patient will score at least 60% function on FOTO to signify clinically meaningful improvement in functional abilities.    Baseline 48%    Time 8    Period Weeks    Status New    Target Date 06/28/20      PT LONG TERM GOAL #3    Title Patient will demonstrate normalized gait pattern.    Baseline antalgic gait    Time 8    Period Weeks    Status New    Target Date 06/28/20      PT LONG TERM GOAL #4   Title Patient will self report ability to tolerate 30 minutes of standing activity with minimal pain (<3/10) to improve tolerance to work specific activity.    Baseline immediate pain with standing    Time 8    Period Weeks    Status New    Target Date 06/28/20                 Plan - 05/16/20 1858    Clinical Impression Statement Patient arrives reporting an overall reduction in her pain and continues to respond well to flexion biased exercises. Able to progress core and hip strengthening with patient initially requiring cues to achieve neutral spine as she demonstrates excessive lordosis with ability to correct once cued. Patient challenged in maintaining pelvic stability with targeted abductor strengthening. Overall good tolerance to today's session without reports of pain,though occasional hamstring cramping with strengthening exercises.    PT Treatment/Interventions ADLs/Self Care Home Management;Cryotherapy;Electrical Stimulation;Moist Heat;Traction;Therapeutic activities;Therapeutic exercise;Neuromuscular re-education;Patient/family education;Manual techniques;Passive range of motion;Dry needling;Taping    PT Next Visit Plan continue to progress core and proximal hip strengthening as tolerated. potential traction    PT Home Exercise Plan Access Code: [email or web address]           Patient will benefit from skilled therapeutic intervention in order to improve the following deficits and impairments:  Abnormal gait,Pain,Decreased strength,Difficulty walking,Postural dysfunction,Impaired flexibility  Visit Diagnosis: Chronic left-sided low back pain with left-sided sciatica  Muscle weakness (generalized)  Difficulty in walking, not elsewhere classified     Problem List Patient Active Problem List   Diagnosis  Date Noted  . Acute pansinusitis 05/02/2020  .  Body mass index (BMI) 37.0-37.9, adult 04/17/2020  . 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 Back pain with left-sided radiculopathy 03/16/2020  . Foot contusion 12/31/2019  . Pain in finger of right hand 04/14/2019  . Lumbar radiculopathy 01/02/2019  . Hypertensive disorder 10/12/2015  . 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 Migraine headache 09/04/2012  . Hyperlipidemia LDL goal <100 05/11/2012  . Vitamin D deficiency 04/11/2011  . Morbid obesity ([place]) 06/01/2007   Gwendolyn Grant, PT, DPT, ATC 05/16/20 7:30 PM  [facility] [address] [place], [place], [address] Phone: [phone]   Fax:  [phone]  Name: Bridget Roberts MRN: [id number] Date of Birth: 10-Jan-1966

## 2020-05-18 ENCOUNTER — Other Ambulatory Visit: Payer: Self-pay

## 2020-05-18 ENCOUNTER — Ambulatory Visit: Payer: 59

## 2020-05-18 DIAGNOSIS — Z0279 Encounter for issue of other medical certificate: Secondary | ICD-10-CM

## 2020-05-18 DIAGNOSIS — M6281 Muscle weakness (generalized): Secondary | ICD-10-CM

## 2020-05-18 DIAGNOSIS — M5442 Lumbago with sciatica, left side: Secondary | ICD-10-CM | POA: Diagnosis not present

## 2020-05-18 DIAGNOSIS — R262 Difficulty in walking, not elsewhere classified: Secondary | ICD-10-CM

## 2020-05-18 DIAGNOSIS — G8929 Other chronic pain: Secondary | ICD-10-CM

## 2020-05-18 NOTE — Telephone Encounter (Signed)
complete

## 2020-05-18 NOTE — Telephone Encounter (Signed)
Forms picked up

## 2020-05-18 NOTE — Therapy (Signed)
[place], [place], [address] Phone: [phone]   Fax:  [phone]  Physical Therapy Treatment  Patient Details  Name: Bridget Roberts MRN: [id number] Date of Birth: 01-10-1966 Referring Provider (PT): Consuella Lose, MD   Encounter Date: 05/18/2020   PT End of Session - 05/18/20 1632    Visit Number 4    Number of Visits 17    Date for PT Re-Evaluation 06/27/20    Authorization Type UHC    PT Start Time 1630    PT Stop Time 1711    PT Time Calculation (min) 41 min           Past Medical History:  Diagnosis Date  . Asthma   . Back pain   . GERD (gastroesophageal reflux disease)   . Headache(784.0)   . Hypertension   . Migraines   . PONV (postoperative nausea and vomiting)     Past Surgical History:  Procedure Laterality Date  . ABDOMINAL HYSTERECTOMY    . ECTOPIC PREGNANCY SURGERY    . ENDOMETRIAL ABLATION    . POLYPECTOMY     vocal chords  . SALPINGECTOMY  right   unilateral  . VAGINAL HYSTERECTOMY N/A 08/04/2013   Procedure: HYSTERECTOMY VAGINAL;  Surgeon: Florian Buff, MD;  Location: [facility];  Service: Gynecology;  Laterality: N/A;  . vocal chord polyp      There were no vitals filed for this visit.   Subjective Assessment - 05/18/20 1633    Subjective Pt presents to PT with no current reports of lower back pain. She does note she had some muscle soreness after last session. Pt has been compliant with her HEP with no adverse effect. She is ready to begin PT treatment at this itme.    Currently in Pain? No/denies    Pain Score 0-No pain                             [facility] Adult PT Treatment/Exercise - 05/18/20 0001      Exercises   Exercises Knee/Hip      Lumbar Exercises: Stretches   Other Lumbar Stretch Exercise fwd/lat ball rollouts x 15 ea      Lumbar Exercises: Aerobic   Nustep lvl 5 x 5 min while taking subjective      Lumbar Exercises: Supine    Straight Leg Raises Limitations 2x10    Other Supine Lumbar Exercises reverse crunch 90/90 x 10    Other Supine Lumbar Exercises Supine 90/90 alternating march 2 x 10      Knee/Hip Exercises: Standing   Hip Abduction 2 sets;10 reps;Both    Hip Extension 2 sets;10 reps;Both    Functional Squat 2 sets;10 reps      Knee/Hip Exercises: Seated   Sit to Sand 2 sets;10 reps;without UE support;Other (comment)   holding 6lb DB     Knee/Hip Exercises: Supine   Bridges 2 sets;10 reps    Other Supine Knee/Hip Exercises hamstring ball rollouts x 15                    PT Short Term Goals - 05/16/20 1909      PT SHORT TERM GOAL #1   Title Patient will be independent with initial HEP.    Time 2    Period Weeks    Status Achieved    Target Date 05/16/20      PT SHORT  TERM GOAL #2   Title Therapist will review FOTO score and anticipated progress.    Baseline reviewed    Time 1    Period Weeks    Status Achieved    Target Date 05/09/20      PT SHORT TERM GOAL #3   Title Patient will demonstrate at least 80 degrees with prone knee bend to decrease stress on low back.    Baseline 90 degrees bilaterally    Time 3    Period Weeks    Status Unable to assess    Target Date 05/24/20             PT Long Term Goals - 05/03/20 1610      PT LONG TERM GOAL #1   Title Patient will demonstrate at least 4+/5 in bilateral hip musculature to improve stability about the chain with prolonged walking and standing.    Baseline see flowsheet    Time 8    Period Weeks    Status New    Target Date 06/28/20      PT LONG TERM GOAL #2   Title Patient will score at least 60% function on FOTO to signify clinically meaningful improvement in functional abilities.    Baseline 48%    Time 8    Period Weeks    Status New    Target Date 06/28/20      PT LONG TERM GOAL #3   Title Patient will demonstrate normalized gait pattern.    Baseline antalgic gait    Time 8    Period Weeks     Status New    Target Date 06/28/20      PT LONG TERM GOAL #4   Title Patient will self report ability to tolerate 30 minutes of standing activity with minimal pain (<3/10) to improve tolerance to work specific activity.    Baseline immediate pain with standing    Time 8    Period Weeks    Status New    Target Date 06/28/20                 Plan - 05/18/20 1706    Clinical Impression Statement Pt was once again able to complete prescribed exercises with no adverse effect or increase in pain. She was able to progress core endurance exercises to 90/90 position at the hip/knee as well as standing proximal hip strenghtening. She does continue to fatigue quickly with higher level exercises, especially into hip flexion. Pt continues to benefit from skilled PT services and should continue to be seen and progressed per POC as prescribed.    PT Treatment/Interventions ADLs/Self Care Home Management;Cryotherapy;Electrical Stimulation;Moist Heat;Traction;Therapeutic activities;Therapeutic exercise;Neuromuscular re-education;Patient/family education;Manual techniques;Passive range of motion;Dry needling;Taping    PT Next Visit Plan continue to progress core and proximal hip strengthening as tolerated. potential traction    PT Home Exercise Plan Access Code: [email or web address]           Patient will benefit from skilled therapeutic intervention in order to improve the following deficits and impairments:  Abnormal gait,Pain,Decreased strength,Difficulty walking,Postural dysfunction,Impaired flexibility  Visit Diagnosis: Chronic left-sided low back pain with left-sided sciatica  Muscle weakness (generalized)  Difficulty in walking, not elsewhere classified     Problem List Patient Active Problem List   Diagnosis Date Noted  . Acute pansinusitis 05/02/2020  . Body mass index (BMI) 37.0-37.9, adult 04/17/2020  . 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 Back pain with left-sided radiculopathy  03/16/2020  . Foot contusion 12/31/2019  .  Pain in finger of right hand 04/14/2019  . Lumbar radiculopathy 01/02/2019  . Hypertensive disorder 10/12/2015  . 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 Migraine headache 09/04/2012  . Hyperlipidemia LDL goal <100 05/11/2012  . Vitamin D deficiency 04/11/2011  . Morbid obesity ([place]) 06/01/2007    Ward Chatters, PT, DPT 05/18/20 5:14 PM  [facility] [address] [place], [place], [address] Phone: [phone]   Fax:  [phone]  Name: Bridget Roberts MRN: [id number] Date of Birth: 1965/05/11

## 2020-05-23 ENCOUNTER — Ambulatory Visit: Payer: 59

## 2020-05-23 ENCOUNTER — Other Ambulatory Visit: Payer: Self-pay

## 2020-05-23 DIAGNOSIS — M5442 Lumbago with sciatica, left side: Secondary | ICD-10-CM | POA: Diagnosis not present

## 2020-05-23 DIAGNOSIS — G8929 Other chronic pain: Secondary | ICD-10-CM

## 2020-05-23 DIAGNOSIS — R262 Difficulty in walking, not elsewhere classified: Secondary | ICD-10-CM

## 2020-05-23 DIAGNOSIS — M6281 Muscle weakness (generalized): Secondary | ICD-10-CM

## 2020-05-23 NOTE — Therapy (Signed)
[place], [place], [address] Phone: [phone]   Fax:  [phone]  Physical Therapy Treatment  Patient Details  Name: Bridget Roberts MRN: [id number] Date of Birth: [birth year] Referring Provider (PT): Consuella Lose, MD   Encounter Date: 05/23/2020   PT End of Session - 05/23/20 1906    Visit Number 5    Number of Visits 17    Date for PT Re-Evaluation 06/27/20    Authorization Type UHC    PT Start Time 6286    PT Stop Time 1720    PT Time Calculation (min) 45 min    Activity Tolerance Patient tolerated treatment well    Behavior During Therapy [facility] for tasks assessed/performed           Past Medical History:  Diagnosis Date  . Asthma   . Back pain   . GERD (gastroesophageal reflux disease)   . Headache(784.0)   . Hypertension   . Migraines   . PONV (postoperative nausea and vomiting)     Past Surgical History:  Procedure Laterality Date  . ABDOMINAL HYSTERECTOMY    . ECTOPIC PREGNANCY SURGERY    . ENDOMETRIAL ABLATION    . POLYPECTOMY     vocal chords  . SALPINGECTOMY  right   unilateral  . VAGINAL HYSTERECTOMY N/A 08/04/2013   Procedure: HYSTERECTOMY VAGINAL;  Surgeon: Florian Buff, MD;  Location: [facility];  Service: Gynecology;  Laterality: N/A;  . vocal chord polyp      There were no vitals filed for this visit.   Subjective Assessment - 05/23/20 1839    Subjective Pt reports she has been doing well. She notes having some back stiffness and L LE pain this morning, but this has resolved.    Limitations Lifting;Standing;Walking;House hold activities    How long can you sit comfortably? "Sitting is kind of a relief"    Diagnostic tests IMPRESSION:  1. Left foraminal disc protrusion with progression from the prior  study. This is causing mild displacement left L3 nerve root in the  foramen.  2. Mild left subarticular stenosis L4-5 with mild progression  3. Mild to moderate  foraminal stenosis bilaterally L5-S1 without  interval change.    Patient Stated Goals "not to be in pain and not to walk with a limp"    Currently in Pain? No/denies    Pain Score 0-No pain    Pain Location Leg    Pain Orientation Left    Pain Descriptors / Indicators Aching    Pain Type Chronic pain    Pain Onset More than a month ago    Pain Frequency Intermittent                             [facility] Adult PT Treatment/Exercise - 05/23/20 0001      Exercises   Exercises Knee/Hip      Lumbar Exercises: Stretches   Other Lumbar Stretch Exercise fwd/lat ball rollouts x 15 ea      Lumbar Exercises: Aerobic   Nustep lvl 5 x 5 min while taking subjective      Lumbar Exercises: Supine   Straight Leg Raises Limitations 2x10    Other Supine Lumbar Exercises reverse crunch 90/90 x 10    Other Supine Lumbar Exercises Supine 90/90 alternating march 2 x 10      Knee/Hip Exercises: Supine   Bridges 2 sets;10 reps  PT Education - 05/23/20 2228    Education Details Re-assessed FOTO and the results were reviewed    Person(s) Educated Patient    Methods Explanation    Comprehension Verbalized understanding            PT Short Term Goals - 05/16/20 1909      PT SHORT TERM GOAL #1   Title Patient will be independent with initial HEP.    Time 2    Period Weeks    Status Achieved    Target Date 05/16/20      PT SHORT TERM GOAL #2   Title Therapist will review FOTO score and anticipated progress.    Baseline reviewed    Time 1    Period Weeks    Status Achieved    Target Date 05/09/20      PT SHORT TERM GOAL #3   Title Patient will demonstrate at least 80 degrees with prone knee bend to decrease stress on low back.    Baseline 90 degrees bilaterally    Time 3    Period Weeks    Status Unable to assess    Target Date 05/24/20             PT Long Term Goals - 05/23/20 2229      PT LONG TERM GOAL #2   Title Patient will  score at least 60% function on FOTO to signify clinically meaningful improvement in functional abilities. 05/23/20- Improved to 56% functional ability    Baseline 48%    Status On-going    Target Date 06/28/20                 Plan - 05/23/20 1901    Clinical Impression Statement Pt's FOTO survey for functioal ability was reassessed and it's improvement reflects her subjective report of improvement. Additionally, pt demonstrates improved ability to complete her ther ex both in quality and tolerance. PT was continued to address lumbosacral/hip flexibility and strength/stability. Pt will benefit from continued PT progressing her ther ex/HEP as tolerated.    Personal Factors and Comorbidities Time since onset of injury/illness/exacerbation    Examination-Activity Limitations Lift;Stand;Locomotion Level    Examination-Participation Restrictions Occupation;Shop    Stability/Clinical Decision Making Stable/Uncomplicated    Clinical Decision Making Low    Rehab Potential Good    PT Frequency 2x / week    PT Duration 8 weeks    PT Treatment/Interventions ADLs/Self Care Home Management;Cryotherapy;Electrical Stimulation;Moist Heat;Traction;Therapeutic activities;Therapeutic exercise;Neuromuscular re-education;Patient/family education;Manual techniques;Passive range of motion;Dry needling;Taping    PT Next Visit Plan continue to progress core and proximal hip strengthening as tolerated. potential traction    PT Home Exercise Plan Access Code: [email or web address]    Consulted and Agree with Plan of Care Patient           Patient will benefit from skilled therapeutic intervention in order to improve the following deficits and impairments:  Abnormal gait,Decreased strength,Difficulty walking,Postural dysfunction,Impaired flexibility  Visit Diagnosis: Chronic left-sided low back pain with left-sided sciatica  Muscle weakness (generalized)  Difficulty in walking, not elsewhere classified     Problem  List Patient Active Problem List   Diagnosis Date Noted  . Acute pansinusitis 05/02/2020  . Body mass index (BMI) 37.0-37.9, adult 04/17/2020  . 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 Back pain with left-sided radiculopathy 03/16/2020  . Foot contusion 12/31/2019  . Pain in finger of right hand 04/14/2019  . Lumbar radiculopathy 01/02/2019  . Hypertensive disorder 10/12/2015  . 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  Migraine headache 09/04/2012  . Hyperlipidemia LDL goal <100 05/11/2012  . Vitamin D deficiency 04/11/2011  . Morbid obesity ([place]) 06/01/2007   Gar Ponto MS, PT 05/23/20 11:19 PM  [facility] [address] [place], [place], [address] Phone: [phone]   Fax:  [phone]  Name: MIABELLA SHANNAHAN MRN: [id number] Date of Birth: 1965/06/27

## 2020-05-25 ENCOUNTER — Other Ambulatory Visit: Payer: Self-pay

## 2020-05-25 ENCOUNTER — Ambulatory Visit: Payer: 59 | Admitting: Physical Therapy

## 2020-05-25 ENCOUNTER — Encounter: Payer: Self-pay | Admitting: Physical Therapy

## 2020-05-25 DIAGNOSIS — R262 Difficulty in walking, not elsewhere classified: Secondary | ICD-10-CM

## 2020-05-25 DIAGNOSIS — M5442 Lumbago with sciatica, left side: Secondary | ICD-10-CM | POA: Diagnosis not present

## 2020-05-25 DIAGNOSIS — G8929 Other chronic pain: Secondary | ICD-10-CM

## 2020-05-25 DIAGNOSIS — M6281 Muscle weakness (generalized): Secondary | ICD-10-CM

## 2020-05-26 ENCOUNTER — Encounter: Payer: Self-pay | Admitting: Physical Therapy

## 2020-05-26 NOTE — Therapy (Signed)
[place], [place], [address] Phone: [phone]   Fax:  [phone]  Physical Therapy Treatment  Patient Details  Name: ADELEINE PASK MRN: [id number] Date of Birth: 11-01-1965 Referring Provider (PT): Consuella Lose, MD   Encounter Date: 05/25/2020   PT End of Session - 05/26/20 0801    Visit Number 6    Date for PT Re-Evaluation 06/27/20    Authorization Type UHC    PT Start Time 1636   Patient 6 minutes late   PT Stop Time 3220    PT Time Calculation (min) 39 min    Activity Tolerance Patient tolerated treatment well    Behavior During Therapy [facility] for tasks assessed/performed           Past Medical History:  Diagnosis Date  . Asthma   . Back pain   . GERD (gastroesophageal reflux disease)   . Headache(784.0)   . Hypertension   . Migraines   . PONV (postoperative nausea and vomiting)     Past Surgical History:  Procedure Laterality Date  . ABDOMINAL HYSTERECTOMY    . ECTOPIC PREGNANCY SURGERY    . ENDOMETRIAL ABLATION    . POLYPECTOMY     vocal chords  . SALPINGECTOMY  right   unilateral  . VAGINAL HYSTERECTOMY N/A 08/04/2013   Procedure: HYSTERECTOMY VAGINAL;  Surgeon: Florian Buff, MD;  Location: [facility];  Service: Gynecology;  Laterality: N/A;  . vocal chord polyp      There were no vitals filed for this visit.   Subjective Assessment - 05/25/20 1647    Subjective Patient worked today and reports that she did pretty well. She reports her knee has hurt some. She is wearing a brace today.    Limitations Lifting;Standing;Walking;House hold activities    How long can you sit comfortably? "Sitting is kind of a relief"    How long can you stand comfortably? "I guess I never really think about it, it aches all the time."    How long can you walk comfortably? "It depends, sometimes brisk walking feels better."    Diagnostic tests IMPRESSION:  1. Left foraminal disc protrusion with  progression from the prior  study. This is causing mild displacement left L3 nerve root in the  foramen.  2. Mild left subarticular stenosis L4-5 with mild progression  3. Mild to moderate foraminal stenosis bilaterally L5-S1 without  interval change.    Patient Stated Goals "not to be in pain and not to walk with a limp"    Currently in Pain? Yes    Pain Score 3     Pain Location Hip    Pain Orientation Left    Pain Descriptors / Indicators Aching    Pain Type Chronic pain    Pain Onset More than a month ago    Pain Frequency Intermittent    Aggravating Factors  standing and walking    Pain Relieving Factors sitting, heat    Effect of Pain on Daily Activities pain with work activity                             [facility] Adult PT Treatment/Exercise - 05/26/20 0001      Lumbar Exercises: Stretches   Lower Trunk Rotation Limitations x15 with cuing not to go through pain    Figure 4 Stretch 3 reps;20 seconds      Lumbar Exercises: Aerobic   Nustep  lvl 5 x 5 min while taking subjective      Knee/Hip Exercises: Standing   Hip Abduction 2 sets;10 reps;Both    Hip Extension 2 sets;10 reps;Both    Functional Squat 2 sets;10 reps                  PT Education - 05/26/20 0801    Education Details HEP and symptom mangement    Person(s) Educated Patient    Methods Explanation;Demonstration;Tactile cues;Verbal cues    Comprehension Verbalized understanding;Returned demonstration;Verbal cues required;Tactile cues required            PT Short Term Goals - 05/16/20 1909      PT SHORT TERM GOAL #1   Title Patient will be independent with initial HEP.    Time 2    Period Weeks    Status Achieved    Target Date 05/16/20      PT SHORT TERM GOAL #2   Title Therapist will review FOTO score and anticipated progress.    Baseline reviewed    Time 1    Period Weeks    Status Achieved    Target Date 05/09/20      PT SHORT TERM GOAL #3   Title Patient will  demonstrate at least 80 degrees with prone knee bend to decrease stress on low back.    Baseline 90 degrees bilaterally    Time 3    Period Weeks    Status Unable to assess    Target Date 05/24/20             PT Long Term Goals - 05/23/20 2229      PT LONG TERM GOAL #2   Title Patient will score at least 60% function on FOTO to signify clinically meaningful improvement in functional abilities. 05/23/20- Improved to 56% functional ability    Baseline 48%    Status On-going    Target Date 06/28/20                 Plan - 05/26/20 0802    Clinical Impression Statement Patient toleratd treatment well. she continues to show sings of weakness in the left LE compared to the right but she is working hard on her exercises. she tolerated ther-ex well today despite working all day. She is motivated to get stronger. Therapy will continue to progress as tolerated.    Personal Factors and Comorbidities Time since onset of injury/illness/exacerbation    Examination-Activity Limitations Lift;Stand;Locomotion Level    Examination-Participation Restrictions Occupation;Shop    Stability/Clinical Decision Making Stable/Uncomplicated    Clinical Decision Making Low    Rehab Potential Good    PT Frequency 2x / week    PT Duration 8 weeks    PT Treatment/Interventions ADLs/Self Care Home Management;Cryotherapy;Electrical Stimulation;Moist Heat;Traction;Therapeutic activities;Therapeutic exercise;Neuromuscular re-education;Patient/family education;Manual techniques;Passive range of motion;Dry needling;Taping    PT Next Visit Plan continue to progress core and proximal hip strengthening as tolerated. potential traction    PT Home Exercise Plan Access Code: [email or web address]    Consulted and Agree with Plan of Care Patient           Patient will benefit from skilled therapeutic intervention in order to improve the following deficits and impairments:  Abnormal gait,Decreased strength,Difficulty  walking,Postural dysfunction,Impaired flexibility  Visit Diagnosis: Chronic left-sided low back pain with left-sided sciatica  Muscle weakness (generalized)  Difficulty in walking, not elsewhere classified     Problem List Patient Active Problem List   Diagnosis Date Noted  .  Acute pansinusitis 05/02/2020  . Body mass index (BMI) 37.0-37.9, adult 04/17/2020  . 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 Back pain with left-sided radiculopathy 03/16/2020  . Foot contusion 12/31/2019  . Pain in finger of right hand 04/14/2019  . Lumbar radiculopathy 01/02/2019  . Hypertensive disorder 10/12/2015  . 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 Migraine headache 09/04/2012  . Hyperlipidemia LDL goal <100 05/11/2012  . Vitamin D deficiency 04/11/2011  . Morbid obesity ([place]) 06/01/2007    Carney Living PT DPT  05/26/2020, 8:05 AM  [facility] [address] [place], [place], [address] Phone: [phone]   Fax:  [phone]  Name: TAIMANE STIMMEL MRN: [id number] Date of Birth: Oct 18, 1965

## 2020-05-30 ENCOUNTER — Other Ambulatory Visit: Payer: Self-pay

## 2020-05-30 ENCOUNTER — Ambulatory Visit: Payer: 59

## 2020-05-30 DIAGNOSIS — G8929 Other chronic pain: Secondary | ICD-10-CM

## 2020-05-30 DIAGNOSIS — M5442 Lumbago with sciatica, left side: Secondary | ICD-10-CM | POA: Diagnosis not present

## 2020-05-30 DIAGNOSIS — R262 Difficulty in walking, not elsewhere classified: Secondary | ICD-10-CM

## 2020-05-30 DIAGNOSIS — M6281 Muscle weakness (generalized): Secondary | ICD-10-CM

## 2020-05-30 NOTE — Therapy (Signed)
[place], [place], [address] Phone: [phone]   Fax:  [phone]  Physical Therapy Treatment  Patient Details  Name: Bridget Roberts MRN: [id number] Date of Birth: 1965/03/03 Referring Provider (PT): Consuella Lose, MD   Encounter Date: 05/30/2020   PT End of Session - 05/30/20 1708    Visit Number 7    Number of Visits 17    Date for PT Re-Evaluation 06/27/20    Authorization Type UHC    PT Start Time 6433    PT Stop Time 2951    PT Time Calculation (min) 39 min    Activity Tolerance No increased pain;Patient tolerated treatment well    Behavior During Therapy [facility] for tasks assessed/performed           Past Medical History:  Diagnosis Date  . Asthma   . Back pain   . GERD (gastroesophageal reflux disease)   . Headache(784.0)   . Hypertension   . Migraines   . PONV (postoperative nausea and vomiting)     Past Surgical History:  Procedure Laterality Date  . ABDOMINAL HYSTERECTOMY    . ECTOPIC PREGNANCY SURGERY    . ENDOMETRIAL ABLATION    . POLYPECTOMY     vocal chords  . SALPINGECTOMY  right   unilateral  . VAGINAL HYSTERECTOMY N/A 08/04/2013   Procedure: HYSTERECTOMY VAGINAL;  Surgeon: Florian Buff, MD;  Location: [facility];  Service: Gynecology;  Laterality: N/A;  . vocal chord polyp      There were no vitals filed for this visit.   Subjective Assessment - 05/30/20 1708    Subjective Pt presents to PT with reports of slight discomfort after standing for most of the day during work, but no current pain. She has been compliant with her HEP with no adverse effect. Pt is ready to begin PT treatment at this time.    Currently in Pain? No/denies    Pain Score 0-No pain                             [facility] Adult PT Treatment/Exercise - 05/30/20 0001      Lumbar Exercises: Aerobic   Nustep lvl 7 UE/LE x 5 min while taking subjective      Lumbar Exercises: Supine    Dead Bug Limitations 2x10 w/ exercise ball    Other Supine Lumbar Exercises table top 90/90 hold 2x30 sec    Other Supine Lumbar Exercises Supine 90/90 alternating march 2 x 10      Knee/Hip Exercises: Standing   Hip Abduction 2 sets;15 reps;Both    Hip Extension 2 sets;15 reps;Both    Functional Squat 2 sets;15 reps    Other Standing Knee Exercises lateral walk x 4 laps at counter red tband      Knee/Hip Exercises: Seated   Sit to Sand 3 sets;10 reps   with 10lb over head press                 PT Education - 05/30/20 1803    Education Details HEP update    Person(s) Educated Patient    Methods Explanation;Demonstration;Handout    Comprehension Verbalized understanding;Returned demonstration            PT Short Term Goals - 05/16/20 1909      PT SHORT TERM GOAL #1   Title Patient will be independent with initial HEP.    Time 2  Period Weeks    Status Achieved    Target Date 05/16/20      PT SHORT TERM GOAL #2   Title Therapist will review FOTO score and anticipated progress.    Baseline reviewed    Time 1    Period Weeks    Status Achieved    Target Date 05/09/20      PT SHORT TERM GOAL #3   Title Patient will demonstrate at least 80 degrees with prone knee bend to decrease stress on low back.    Baseline 90 degrees bilaterally    Time 3    Period Weeks    Status Unable to assess    Target Date 05/24/20             PT Long Term Goals - 05/23/20 2229      PT LONG TERM GOAL #2   Title Patient will score at least 60% function on FOTO to signify clinically meaningful improvement in functional abilities. 05/23/20- Improved to 56% functional ability    Baseline 48%    Status On-going    Target Date 06/28/20                 Plan - 05/30/20 1803    Clinical Impression Statement Pt was able to complete all prescribed exercises and tolerated treatment well with no adverse effect. She was able to progress proximal hip and core strengthening  exercises today and continues to show fatigue in L LE. Pt is highly motivated to increase strength and improve function. PT will continue to progress per POC as prescribed.    PT Treatment/Interventions ADLs/Self Care Home Management;Cryotherapy;Electrical Stimulation;Moist Heat;Traction;Therapeutic activities;Therapeutic exercise;Neuromuscular re-education;Patient/family education;Manual techniques;Passive range of motion;Dry needling;Taping    PT Next Visit Plan continue to progress core and proximal hip strengthening as tolerated. potential traction    PT Home Exercise Plan Access Code: [email or web address]           Patient will benefit from skilled therapeutic intervention in order to improve the following deficits and impairments:  Abnormal gait,Decreased strength,Difficulty walking,Postural dysfunction,Impaired flexibility  Visit Diagnosis: Chronic left-sided low back pain with left-sided sciatica  Muscle weakness (generalized)  Difficulty in walking, not elsewhere classified     Problem List Patient Active Problem List   Diagnosis Date Noted  . Acute pansinusitis 05/02/2020  . Body mass index (BMI) 37.0-37.9, adult 04/17/2020  . 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 Back pain with left-sided radiculopathy 03/16/2020  . Foot contusion 12/31/2019  . Pain in finger of right hand 04/14/2019  . Lumbar radiculopathy 01/02/2019  . Hypertensive disorder 10/12/2015  . 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 Migraine headache 09/04/2012  . Hyperlipidemia LDL goal <100 05/11/2012  . Vitamin D deficiency 04/11/2011  . Morbid obesity ([place]) 06/01/2007    Ward Chatters, PT, DPT 05/30/20 6:08 PM  [facility] [address] [place], [place], [address] Phone: [phone]   Fax:  [phone]  Name: Bridget Roberts MRN: [id number] Date of Birth: 1965/11/26

## 2020-06-01 ENCOUNTER — Other Ambulatory Visit: Payer: Self-pay

## 2020-06-01 ENCOUNTER — Ambulatory Visit: Payer: 59

## 2020-06-01 DIAGNOSIS — M5442 Lumbago with sciatica, left side: Secondary | ICD-10-CM | POA: Diagnosis not present

## 2020-06-01 DIAGNOSIS — R262 Difficulty in walking, not elsewhere classified: Secondary | ICD-10-CM

## 2020-06-01 DIAGNOSIS — G8929 Other chronic pain: Secondary | ICD-10-CM

## 2020-06-01 DIAGNOSIS — M6281 Muscle weakness (generalized): Secondary | ICD-10-CM

## 2020-06-01 NOTE — Therapy (Signed)
[place], [place], [address] Phone: [phone]   Fax:  [phone]  Physical Therapy Treatment  Patient Details  Name: Bridget Roberts MRN: [id number] Date of Birth: 1965-10-12 Referring Provider (PT): Consuella Lose, MD   Encounter Date: 06/01/2020   PT End of Session - 06/01/20 1702    Visit Number 8    Number of Visits 17    Date for PT Re-Evaluation 06/27/20    Authorization Type UHC    PT Start Time 1701    PT Stop Time 1740    PT Time Calculation (min) 39 min    Activity Tolerance No increased pain;Patient tolerated treatment well    Behavior During Therapy [facility] for tasks assessed/performed           Past Medical History:  Diagnosis Date  . Asthma   . Back pain   . GERD (gastroesophageal reflux disease)   . Headache(784.0)   . Hypertension   . Migraines   . PONV (postoperative nausea and vomiting)     Past Surgical History:  Procedure Laterality Date  . ABDOMINAL HYSTERECTOMY    . ECTOPIC PREGNANCY SURGERY    . ENDOMETRIAL ABLATION    . POLYPECTOMY     vocal chords  . SALPINGECTOMY  right   unilateral  . VAGINAL HYSTERECTOMY N/A 08/04/2013   Procedure: HYSTERECTOMY VAGINAL;  Surgeon: Florian Buff, MD;  Location: [facility];  Service: Gynecology;  Laterality: N/A;  . vocal chord polyp      There were no vitals filed for this visit.   Subjective Assessment - 06/01/20 1702    Subjective Pt presents to PT with reports of slight lower back stiffness, but otherwise no reports of pain. She has been compliant with her HEP with no adverse effect. Pt is ready to begin PT treatment at this time.    Currently in Pain? No/denies    Pain Score 0-No pain                             [facility] Adult PT Treatment/Exercise - 06/01/20 0001      Lumbar Exercises: Stretches   Figure 4 Stretch 3 reps;20 seconds    Other Lumbar Stretch Exercise fwd/lat ball rollouts x 15 ea       Lumbar Exercises: Aerobic   Nustep lvl 7 UE/LE x 5 min while taking subjective      Lumbar Exercises: Seated   Sit to Stand 10 reps   x 3   Sit to Stand Limitations w/ 10lb DB overhead press      Lumbar Exercises: Supine   Pelvic Tilt 10 reps;5 seconds    Other Supine Lumbar Exercises table top 90/90 hold 2x30 sec    Other Supine Lumbar Exercises Supine 90/90 alternating march 2 x 10      Lumbar Exercises: Sidelying   Other Sidelying Lumbar Exercises modified side plank on knees 2x15 sec ea      Knee/Hip Exercises: Standing   Hip Abduction 2 sets;10 reps;Both    Abduction Limitations red tband    Hip Extension 2 sets;10 reps;Both    Extension Limitations red tband    Functional Squat 2 sets;15 reps                    PT Short Term Goals - 05/16/20 1909      PT SHORT TERM GOAL #1   Title Patient will  be independent with initial HEP.    Time 2    Period Weeks    Status Achieved    Target Date 05/16/20      PT SHORT TERM GOAL #2   Title Therapist will review FOTO score and anticipated progress.    Baseline reviewed    Time 1    Period Weeks    Status Achieved    Target Date 05/09/20      PT SHORT TERM GOAL #3   Title Patient will demonstrate at least 80 degrees with prone knee bend to decrease stress on low back.    Baseline 90 degrees bilaterally    Time 3    Period Weeks    Status Unable to assess    Target Date 05/24/20             PT Long Term Goals - 05/23/20 2229      PT LONG TERM GOAL #2   Title Patient will score at least 60% function on FOTO to signify clinically meaningful improvement in functional abilities. 05/23/20- Improved to 56% functional ability    Baseline 48%    Status On-going    Target Date 06/28/20                 Plan - 06/01/20 1735    Clinical Impression Statement Pt was once again able to complete all prescribed exercises with no adverse effect or increase in pain. She continues to progress well with therpay as  she was able to perform side planks today with no increase in lower back pain. Overall she is doing well with more difficult exercises. PT will continue to progress as tolerated working towards deadlift at next session.    PT Treatment/Interventions ADLs/Self Care Home Management;Cryotherapy;Electrical Stimulation;Moist Heat;Traction;Therapeutic activities;Therapeutic exercise;Neuromuscular re-education;Patient/family education;Manual techniques;Passive range of motion;Dry needling;Taping    PT Next Visit Plan initate kettlebell deadlift and continue to progress core and hip strengthening as able    PT Home Exercise Plan Access Code: [email or web address]           Patient will benefit from skilled therapeutic intervention in order to improve the following deficits and impairments:  Abnormal gait,Decreased strength,Difficulty walking,Postural dysfunction,Impaired flexibility  Visit Diagnosis: Chronic left-sided low back pain with left-sided sciatica  Muscle weakness (generalized)  Difficulty in walking, not elsewhere classified     Problem List Patient Active Problem List   Diagnosis Date Noted  . Acute pansinusitis 05/02/2020  . Body mass index (BMI) 37.0-37.9, adult 04/17/2020  . 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 Back pain with left-sided radiculopathy 03/16/2020  . Foot contusion 12/31/2019  . Pain in finger of right hand 04/14/2019  . Lumbar radiculopathy 01/02/2019  . Hypertensive disorder 10/12/2015  . 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 Migraine headache 09/04/2012  . Hyperlipidemia LDL goal <100 05/11/2012  . Vitamin D deficiency 04/11/2011  . Morbid obesity ([place]) 06/01/2007    Ward Chatters, PT, DPT 06/01/20 5:41 PM  [facility] [address] [place], [place], [address] Phone: [phone]   Fax:  [phone]  Name: Bridget Roberts MRN: [id number] Date of Birth: 09/19/1965

## 2020-06-06 ENCOUNTER — Other Ambulatory Visit: Payer: Self-pay

## 2020-06-06 ENCOUNTER — Ambulatory Visit: Payer: 59

## 2020-06-06 DIAGNOSIS — M6281 Muscle weakness (generalized): Secondary | ICD-10-CM

## 2020-06-06 DIAGNOSIS — M5442 Lumbago with sciatica, left side: Secondary | ICD-10-CM

## 2020-06-06 DIAGNOSIS — R262 Difficulty in walking, not elsewhere classified: Secondary | ICD-10-CM

## 2020-06-06 DIAGNOSIS — G8929 Other chronic pain: Secondary | ICD-10-CM

## 2020-06-06 NOTE — Therapy (Signed)
[place], [place], [address] Phone: [phone]   Fax:  [phone]  Physical Therapy Treatment  Patient Details  Name: Bridget Roberts MRN: [id number] Date of Birth: 01-Feb-1966 Referring Provider (PT): Consuella Lose, MD   Encounter Date: 06/06/2020   PT End of Session - 06/06/20 1706    Visit Number 9    Number of Visits 17    Date for PT Re-Evaluation 06/27/20    Authorization Type UHC    PT Start Time 1703    PT Stop Time 1744    PT Time Calculation (min) 41 min    Activity Tolerance Patient tolerated treatment well    Behavior During Therapy [facility] for tasks assessed/performed           Past Medical History:  Diagnosis Date  . Asthma   . Back pain   . GERD (gastroesophageal reflux disease)   . Headache(784.0)   . Hypertension   . Migraines   . PONV (postoperative nausea and vomiting)     Past Surgical History:  Procedure Laterality Date  . ABDOMINAL HYSTERECTOMY    . ECTOPIC PREGNANCY SURGERY    . ENDOMETRIAL ABLATION    . POLYPECTOMY     vocal chords  . SALPINGECTOMY  right   unilateral  . VAGINAL HYSTERECTOMY N/A 08/04/2013   Procedure: HYSTERECTOMY VAGINAL;  Surgeon: Florian Buff, MD;  Location: [facility];  Service: Gynecology;  Laterality: N/A;  . vocal chord polyp      There were no vitals filed for this visit.   Subjective Assessment - 06/06/20 1705    Subjective "My back feels good, no pain. My leg is a little sore from my exercises."    Currently in Pain? No/denies                             [facility] Adult PT Treatment/Exercise - 06/06/20 0001      Self-Care   Other Self-Care Comments  see patient education      Lumbar Exercises: Aerobic   Nustep lvl 7 UE/LE x 5 min      Lumbar Exercises: Standing   Other Standing Lumbar Exercises hip hinge with dowel 2x 10      Lumbar Exercises: Seated   Other Seated Lumbar Exercises TA march 2x20 reps       Lumbar Exercises: Supine   Dead Bug 20 reps    Dead Bug Limitations x 2    Bridge 10 reps    Bridge Limitations x2; on stability    Other Supine Lumbar Exercises 90/90 march 2 x 20      Lumbar Exercises: Quadruped   Single Arm Raise 10 reps    Straight Leg Raise 10 reps    Straight Leg Raises Limitations x 2      Knee/Hip Exercises: Standing   Other Standing Knee Exercises lateral band walks blue band 4 x 30 ft                  PT Education - 06/06/20 1745    Education Details updated HEP    Person(s) Educated Patient    Methods Explanation;Verbal cues;Tactile cues;Demonstration;Handout    Comprehension Verbalized understanding;Returned demonstration;Verbal cues required            PT Short Term Goals - 05/16/20 1909      PT SHORT TERM GOAL #1   Title Patient will be independent with initial  HEP.    Time 2    Period Weeks    Status Achieved    Target Date 05/16/20      PT SHORT TERM GOAL #2   Title Therapist will review FOTO score and anticipated progress.    Baseline reviewed    Time 1    Period Weeks    Status Achieved    Target Date 05/09/20      PT SHORT TERM GOAL #3   Title Patient will demonstrate at least 80 degrees with prone knee bend to decrease stress on low back.    Baseline 90 degrees bilaterally    Time 3    Period Weeks    Status Unable to assess    Target Date 05/24/20             PT Long Term Goals - 05/23/20 2229      PT LONG TERM GOAL #2   Title Patient will score at least 60% function on FOTO to signify clinically meaningful improvement in functional abilities. 05/23/20- Improved to 56% functional ability    Baseline 48%    Status On-going    Target Date 06/28/20                 Plan - 06/06/20 1707    Clinical Impression Statement Patient tolerated session well today without reports of pain. Able to further progress dynamic core stabilization with patient demonstrating good core control with supine activity, though  challenged in maintaining lumbopelvic stability with quadruped exercises. Patient able to properly perform hip hinge, so will plan to progress to deadlift at next session.    PT Treatment/Interventions ADLs/Self Care Home Management;Cryotherapy;Electrical Stimulation;Moist Heat;Traction;Therapeutic activities;Therapeutic exercise;Neuromuscular re-education;Patient/family education;Manual techniques;Passive range of motion;Dry needling;Taping    PT Next Visit Plan initate kettlebell deadlift and continue to progress core and hip strengthening as able    PT Home Exercise Plan Access Code: [email or web address]    Consulted and Agree with Plan of Care Patient           Patient will benefit from skilled therapeutic intervention in order to improve the following deficits and impairments:  Abnormal gait,Decreased strength,Difficulty walking,Postural dysfunction,Impaired flexibility  Visit Diagnosis: Chronic left-sided low back pain with left-sided sciatica  Muscle weakness (generalized)  Difficulty in walking, not elsewhere classified     Problem List Patient Active Problem List   Diagnosis Date Noted  . Acute pansinusitis 05/02/2020  . Body mass index (BMI) 37.0-37.9, adult 04/17/2020  . 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 Back pain with left-sided radiculopathy 03/16/2020  . Foot contusion 12/31/2019  . Pain in finger of right hand 04/14/2019  . Lumbar radiculopathy 01/02/2019  . Hypertensive disorder 10/12/2015  . 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 Migraine headache 09/04/2012  . Hyperlipidemia LDL goal <100 05/11/2012  . Vitamin D deficiency 04/11/2011  . Morbid obesity ([place]) 06/01/2007   Gwendolyn Grant, PT, DPT, ATC 06/06/20 5:46 PM  [facility] [address] [place], [place], [address] Phone: [phone]   Fax:  [phone]  Name: Bridget Roberts MRN: [id number] Date of Birth: [birth year]

## 2020-06-08 ENCOUNTER — Other Ambulatory Visit: Payer: Self-pay

## 2020-06-08 ENCOUNTER — Ambulatory Visit: Payer: 59

## 2020-06-08 DIAGNOSIS — M5442 Lumbago with sciatica, left side: Secondary | ICD-10-CM | POA: Diagnosis not present

## 2020-06-08 DIAGNOSIS — M6281 Muscle weakness (generalized): Secondary | ICD-10-CM

## 2020-06-08 DIAGNOSIS — G8929 Other chronic pain: Secondary | ICD-10-CM

## 2020-06-08 DIAGNOSIS — R262 Difficulty in walking, not elsewhere classified: Secondary | ICD-10-CM

## 2020-06-08 NOTE — Therapy (Signed)
[place], [place], [address] Phone: [phone]   Fax:  [phone]  Physical Therapy Treatment  Patient Details  Name: Bridget Roberts MRN: [id number] Date of Birth: [birth year] Referring Provider (PT): Consuella Lose, MD   Encounter Date: 06/08/2020   PT End of Session - 06/08/20 1701    Visit Number 10    Number of Visits 17    Date for PT Re-Evaluation 06/27/20    Authorization Type U[employer]    PT Start Time 1701    PT Stop Time 1745    PT Time Calculation (min) 44 min    Activity Tolerance Patient tolerated treatment well;Patient limited by pain    Behavior During Therapy [facility] for tasks assessed/performed           Past Medical History:  Diagnosis Date  . Asthma   . Back pain   . GERD (gastroesophageal reflux disease)   . Headache(784.0)   . Hypertension   . Migraines   . PONV (postoperative nausea and vomiting)     Past Surgical History:  Procedure Laterality Date  . ABDOMINAL HYSTERECTOMY    . ECTOPIC PREGNANCY SURGERY    . ENDOMETRIAL ABLATION    . POLYPECTOMY     vocal chords  . SALPINGECTOMY  right   unilateral  . VAGINAL HYSTERECTOMY N/A 08/04/2013   Procedure: HYSTERECTOMY VAGINAL;  Surgeon: Florian Buff, MD;  Location: [facility];  Service: Gynecology;  Laterality: N/A;  . vocal chord polyp      There were no vitals filed for this visit.   Subjective Assessment - 06/08/20 1706    Subjective Patient reports having pain since Tuesday evening that is worse at night.    Currently in Pain? Yes    Pain Score 3     Pain Location Leg    Pain Orientation Left   groin to lower posteromedial leg   Pain Descriptors / Indicators --   "like a pulled muscle"   Pain Type Chronic pain    Pain Onset More than a month ago    Pain Frequency Intermittent                             [facility] Adult PT Treatment/Exercise - 06/08/20 0001      Self-Care   Other Self-Care  Comments  see patient education      Lumbar Exercises: Stretches   Double Knee to Chest Stretch Limitations 2x10, 5 sec hold with strap      Lumbar Exercises: Quadruped   Straight Leg Raises Limitations 2 x 8      Knee/Hip Exercises: Stretches   Sports administrator 60 seconds    Quad Stretch Limitations prone with strap bilateral    Other Knee/Hip Stretches butterfly stretch 60 sec      Manual Therapy   Manual therapy comments Passive Lt hip ROM, LAD 3 x 1 min, inferior hip mobs                  PT Education - 06/08/20 1752    Education Details Lumbar anatomy, discussed appropriate stretching with stretch strap that she recently purchased.    Person(s) Educated Patient    Methods Explanation;Tactile cues;Verbal cues    Comprehension Verbalized understanding;Returned demonstration;Verbal cues required            PT Short Term Goals - 05/16/20 1909      PT SHORT TERM GOAL #  1   Title Patient will be independent with initial HEP.    Time 2    Period Weeks    Status Achieved    Target Date 05/16/20      PT SHORT TERM GOAL #2   Title Therapist will review FOTO score and anticipated progress.    Baseline reviewed    Time 1    Period Weeks    Status Achieved    Target Date 05/09/20      PT SHORT TERM GOAL #3   Title Patient will demonstrate at least 80 degrees with prone knee bend to decrease stress on low back.    Baseline 90 degrees bilaterally    Time 3    Period Weeks    Status Unable to assess    Target Date 05/24/20             PT Long Term Goals - 05/23/20 2229      PT LONG TERM GOAL #2   Title Patient will score at least 60% function on FOTO to signify clinically meaningful improvement in functional abilities. 05/23/20- Improved to 56% functional ability    Baseline 48%    Status On-going    Target Date 06/28/20                 Plan - 06/08/20 1753    Clinical Impression Statement Session focused on addressing her pain as patient arrives  reporting pain along Lt groin to posteromedial calf that has been present since the evening of last session. She describes it as a pulling sensation. She initially responds well to LAD reporting abolishment of pain following this intervention. Pain returns when she completes groin stretching as well as being exacerabated with lumbar extension. Trialed flexion biased movement with patient reporting a reduction in groin pain following supine knees to chest, though no change in lower leg pain. Continued with quadruped core stabilization training with patient having difficulty maintaining core control with leg raises. She reported a reduction in pain at conclusion of session.    PT Treatment/Interventions ADLs/Self Care Home Management;Cryotherapy;Electrical Stimulation;Moist Heat;Traction;Therapeutic activities;Therapeutic exercise;Neuromuscular re-education;Patient/family education;Manual techniques;Passive range of motion;Dry needling;Taping    PT Next Visit Plan FOTO. initate kettlebell deadlift and continue to progress core and hip strengthening as able    PT Home Exercise Plan Access Code: [email or web address]    Consulted and Agree with Plan of Care Patient           Patient will benefit from skilled therapeutic intervention in order to improve the following deficits and impairments:  Abnormal gait,Decreased strength,Difficulty walking,Postural dysfunction,Impaired flexibility  Visit Diagnosis: Chronic left-sided low back pain with left-sided sciatica  Muscle weakness (generalized)  Difficulty in walking, not elsewhere classified     Problem List Patient Active Problem List   Diagnosis Date Noted  . Acute pansinusitis 05/02/2020  . Body mass index (BMI) 37.0-37.9, adult 04/17/2020  . 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 Back pain with left-sided radiculopathy 03/16/2020  . Foot contusion 12/31/2019  . Pain in finger of right hand 04/14/2019  . Lumbar radiculopathy 01/02/2019  . Hypertensive  disorder 10/12/2015  . 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 Migraine headache 09/04/2012  . Hyperlipidemia LDL goal <100 05/11/2012  . Vitamin D deficiency 04/11/2011  . Morbid obesity ([place]) 06/01/2007   Gwendolyn Grant, PT, DPT, ATC 06/08/20 6:01 PM  [facility] [address] [place], [place], [address] Phone: [phone]   Fax:  [phone]  Name: CAROLEE CHANNELL MRN:  [id number] Date of Birth: Feb 06, 1966

## 2020-06-13 ENCOUNTER — Other Ambulatory Visit: Payer: Self-pay

## 2020-06-13 ENCOUNTER — Ambulatory Visit: Payer: 59 | Attending: Neurosurgery

## 2020-06-13 DIAGNOSIS — M5442 Lumbago with sciatica, left side: Secondary | ICD-10-CM | POA: Insufficient documentation

## 2020-06-13 DIAGNOSIS — R262 Difficulty in walking, not elsewhere classified: Secondary | ICD-10-CM | POA: Diagnosis present

## 2020-06-13 DIAGNOSIS — M6281 Muscle weakness (generalized): Secondary | ICD-10-CM | POA: Insufficient documentation

## 2020-06-13 DIAGNOSIS — G8929 Other chronic pain: Secondary | ICD-10-CM | POA: Insufficient documentation

## 2020-06-13 NOTE — Therapy (Signed)
[place], [place], [address] Phone: [phone]   Fax:  [phone]  Physical Therapy Treatment  Patient Details  Name: Bridget Roberts MRN: [id number] Date of Birth: Apr 28, 1965 Referring Provider (PT): Consuella Lose, MD   Encounter Date: 06/13/2020   PT End of Session - 06/13/20 1704    Visit Number 11    Number of Visits 17    Date for PT Re-Evaluation 06/27/20    Authorization Type UHC    PT Start Time 4008    PT Stop Time 1744    PT Time Calculation (min) 40 min    Activity Tolerance Patient tolerated treatment well    Behavior During Therapy [facility] for tasks assessed/performed           Past Medical History:  Diagnosis Date  . Asthma   . Back pain   . GERD (gastroesophageal reflux disease)   . Headache(784.0)   . Hypertension   . Migraines   . PONV (postoperative nausea and vomiting)     Past Surgical History:  Procedure Laterality Date  . ABDOMINAL HYSTERECTOMY    . ECTOPIC PREGNANCY SURGERY    . ENDOMETRIAL ABLATION    . POLYPECTOMY     vocal chords  . SALPINGECTOMY  right   unilateral  . VAGINAL HYSTERECTOMY N/A 08/04/2013   Procedure: HYSTERECTOMY VAGINAL;  Surgeon: Florian Buff, MD;  Location: [facility];  Service: Gynecology;  Laterality: N/A;  . vocal chord polyp      There were no vitals filed for this visit.   Subjective Assessment - 06/13/20 1707    Subjective "I'm doing ok, haven't been doing my exercises because I don't want to hurt."She reports she started taking prescribed medication from referring provider that is for arthritis yesterday, which has helped with her pain.    Currently in Pain? No/denies    Pain Onset --              [facility] PT Assessment - 06/13/20 0001      Observation/Other Assessments   Focus on Therapeutic Outcomes (FOTO)  70% function                         [facility] Adult PT Treatment/Exercise - 06/13/20 0001       Self-Care   Other Self-Care Comments  see patient education      Lumbar Exercises: Stretches   Passive Hamstring Stretch 60 seconds    Passive Hamstring Stretch Limitations seated Lt    Other Lumbar Stretch Exercise sciatic nerve glides Lt 1 x 10      Lumbar Exercises: Supine   Dead Bug 20 reps    Dead Bug Limitations x2; LE only    Bridge with clamshell 10 reps   x2; blue band   Straight Leg Raise 10 reps    Straight Leg Raises Limitations bilateral      Lumbar Exercises: Sidelying   Hip Abduction 10 reps    Hip Abduction Limitations x2; with flexion/extension toe taps                  PT Education - 06/13/20 1718    Education Details FOTO score. Encouraged to continue with prescribed HEP. Updated HEP.    Person(s) Educated Patient    Methods Explanation;Tactile cues;Verbal cues;Handout    Comprehension Verbalized understanding;Returned demonstration;Verbal cues required            PT Short Term Goals -  05/16/20 1909      PT SHORT TERM GOAL #1   Title Patient will be independent with initial HEP.    Time 2    Period Weeks    Status Achieved    Target Date 05/16/20      PT SHORT TERM GOAL #2   Title Therapist will review FOTO score and anticipated progress.    Baseline reviewed    Time 1    Period Weeks    Status Achieved    Target Date 05/09/20      PT SHORT TERM GOAL #3   Title Patient will demonstrate at least 80 degrees with prone knee bend to decrease stress on low back.    Baseline 90 degrees bilaterally    Time 3    Period Weeks    Status Unable to assess    Target Date 05/24/20             PT Long Term Goals - 06/13/20 1726      PT LONG TERM GOAL #1   Title Patient will demonstrate at least 4+/5 in bilateral hip musculature to improve stability about the chain with prolonged walking and standing.    Baseline see flowsheet    Time 8    Period Weeks    Status Unable to assess      PT LONG TERM GOAL #2   Title Patient will score at  least 60% function on FOTO to signify clinically meaningful improvement in functional abilities.    Baseline 70% 06/13/20    Time 8    Period Weeks    Status Achieved      PT LONG TERM GOAL #3   Title Patient will demonstrate normalized gait pattern.    Baseline antalgic gait    Time 8    Period Weeks    Status On-going      PT LONG TERM GOAL #4   Title Patient will self report ability to tolerate 30 minutes of standing activity with minimal pain (<3/10) to improve tolerance to work specific activity.    Baseline intermittent pain    Time 8    Period Weeks    Status On-going                 Plan - 06/13/20 1705    Clinical Impression Statement Patient arrives without reports of pain, though admits to not completing HEP since last session due to fear of causing more pain. Her FOTO score has much improved compared to baseline having met this long term functional goal. Overall good tolerance to hip and core strengthening today reporting more difficulty during [place] strenghtening on the LLE due to weakness. She reported a pulling sensation in Lt posterior thigh with 90/90 toe taps that was reduced with hamstring stretching and sciatic nerve glides. She was encouraged to continue with prescribed HEP with patient verbalizing understanding.    PT Treatment/Interventions ADLs/Self Care Home Management;Cryotherapy;Electrical Stimulation;Moist Heat;Traction;Therapeutic activities;Therapeutic exercise;Neuromuscular re-education;Patient/family education;Manual techniques;Passive range of motion;Dry needling;Taping    PT Next Visit Plan update HEP. initate kettlebell deadlift and continue to progress core and hip strengthening as able    PT Home Exercise Plan Access Code: [email or web address]    Consulted and Agree with Plan of Care Patient           Patient will benefit from skilled therapeutic intervention in order to improve the following deficits and impairments:  Abnormal gait,Decreased  strength,Difficulty walking,Postural dysfunction,Impaired flexibility  Visit Diagnosis: Chronic left-sided low back pain  with left-sided sciatica  Muscle weakness (generalized)  Difficulty in walking, not elsewhere classified     Problem List Patient Active Problem List   Diagnosis Date Noted  . Acute pansinusitis 05/02/2020  . Body mass index (BMI) 37.0-37.9, adult 04/17/2020  . 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 Back pain with left-sided radiculopathy 03/16/2020  . Foot contusion 12/31/2019  . Pain in finger of right hand 04/14/2019  . Lumbar radiculopathy 01/02/2019  . Hypertensive disorder 10/12/2015  . 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 Migraine headache 09/04/2012  . Hyperlipidemia LDL goal <100 05/11/2012  . Vitamin D deficiency 04/11/2011  . Morbid obesity ([place]) 06/01/2007   Gwendolyn Grant, PT, DPT, ATC 06/13/20 5:47 PM  [facility] [address] [place], [place], [address] Phone: [phone]   Fax:  [phone]  Name: Bridget Roberts MRN: [id number] Date of Birth: 1965-10-22

## 2020-06-15 ENCOUNTER — Other Ambulatory Visit: Payer: Self-pay

## 2020-06-15 ENCOUNTER — Ambulatory Visit: Payer: 59

## 2020-06-15 DIAGNOSIS — G8929 Other chronic pain: Secondary | ICD-10-CM

## 2020-06-15 DIAGNOSIS — M6281 Muscle weakness (generalized): Secondary | ICD-10-CM

## 2020-06-15 DIAGNOSIS — R262 Difficulty in walking, not elsewhere classified: Secondary | ICD-10-CM

## 2020-06-15 DIAGNOSIS — M5442 Lumbago with sciatica, left side: Secondary | ICD-10-CM | POA: Diagnosis not present

## 2020-06-15 NOTE — Therapy (Signed)
[place], [place], [address] Phone: [phone]   Fax:  [phone]  Physical Therapy Treatment  Patient Details  Name: Bridget Roberts MRN: [id number] Date of Birth: 1965/05/31 Referring Provider (PT): Consuella Lose, MD   Encounter Date: 06/15/2020   PT End of Session - 06/15/20 1714    Visit Number 12    Number of Visits 17    Date for PT Re-Evaluation 06/27/20    Authorization Type UHC    PT Start Time 5366   patient late   PT Stop Time 1745    PT Time Calculation (min) 30 min    Activity Tolerance Patient tolerated treatment well    Behavior During Therapy [facility] for tasks assessed/performed           Past Medical History:  Diagnosis Date  . Asthma   . Back pain   . GERD (gastroesophageal reflux disease)   . Headache(784.0)   . Hypertension   . Migraines   . PONV (postoperative nausea and vomiting)     Past Surgical History:  Procedure Laterality Date  . ABDOMINAL HYSTERECTOMY    . ECTOPIC PREGNANCY SURGERY    . ENDOMETRIAL ABLATION    . POLYPECTOMY     vocal chords  . SALPINGECTOMY  right   unilateral  . VAGINAL HYSTERECTOMY N/A 08/04/2013   Procedure: HYSTERECTOMY VAGINAL;  Surgeon: Florian Buff, MD;  Location: [facility];  Service: Gynecology;  Laterality: N/A;  . vocal chord polyp      There were no vitals filed for this visit.   Subjective Assessment - 06/15/20 1716    Subjective "I'm doing good. No pain right now. I have been working on my exercises."    Currently in Pain? No/denies              [facility] PT Assessment - 06/15/20 0001      Observation/Other Assessments   Observations mild left lateral shift in standing      Flexibility   Quadriceps PKB 75 degrees bilaterally      Ambulation/Gait   Gait Comments left lateral trunk flexion present when ambulating in clinic                         [facility] Adult PT Treatment/Exercise - 06/15/20 0001       Self-Care   Other Self-Care Comments  see patient education      Lumbar Exercises: Standing   Other Standing Lumbar Exercises left lateral shift correction 2 x 10      Lumbar Exercises: Supine   Dead Bug 20 reps    Dead Bug Limitations x2; LE only      Lumbar Exercises: Sidelying   Other Sidelying Lumbar Exercises sidelying hip abduction circles CW/CCW 2 x 10      Knee/Hip Exercises: Standing   SLS 5 sec each; trendelenburg present bilaterally and left lateral trunk flexion    Other Standing Knee Exercises 3 way hip 2 x 10                  PT Education - 06/15/20 1751    Education Details Recommended she complete SLS as part of HEP focusing on maintaining neutral spine    Person(s) Educated Patient    Methods Explanation    Comprehension Verbalized understanding            PT Short Term Goals - 06/15/20 1719  PT SHORT TERM GOAL #1   Title Patient will be independent with initial HEP.    Time 2    Period Weeks    Status Achieved    Target Date 05/16/20      PT SHORT TERM GOAL #2   Title Therapist will review FOTO score and anticipated progress.    Baseline reviewed    Time 1    Period Weeks    Status Achieved    Target Date 05/09/20      PT SHORT TERM GOAL #3   Title Patient will demonstrate at least 80 degrees with prone knee bend to decrease stress on low back.    Baseline 75 degrees bilaterally    Time 3    Period Weeks    Status Achieved    Target Date 05/24/20             PT Long Term Goals - 06/13/20 1726      PT LONG TERM GOAL #1   Title Patient will demonstrate at least 4+/5 in bilateral hip musculature to improve stability about the chain with prolonged walking and standing.    Baseline see flowsheet    Time 8    Period Weeks    Status Unable to assess      PT LONG TERM GOAL #2   Title Patient will score at least 60% function on FOTO to signify clinically meaningful improvement in functional abilities.    Baseline 70% 06/13/20     Time 8    Period Weeks    Status Achieved      PT LONG TERM GOAL #3   Title Patient will demonstrate normalized gait pattern.    Baseline antalgic gait    Time 8    Period Weeks    Status On-going      PT LONG TERM GOAL #4   Title Patient will self report ability to tolerate 30 minutes of standing activity with minimal pain (<3/10) to improve tolerance to work specific activity.    Baseline intermittent pain    Time 8    Period Weeks    Status On-going                 Plan - 06/15/20 1719    Clinical Impression Statement Session was limited due to patient being late for appointment. Her quadricep flexibility has signficantly improved compared to initial evaluation having met this functional goal. She has difficulty maintaining SL balance and maintaining neutral trunk alignment during standing 3 way hip bilaterally. Upon further assessment she is noted to have slight left lateral shift in standing that is not changed with lateral shift correction. Though she has mild left lateral shift in standing she has an increase in left lateral trunk flexion and pelvic drop during SLS and during left stance phase of walking and was encouraged to complete single leg balance as part of her HEP focusing on maintaining neutral hip and trunk alignment with patient verbalizing understanding. No reports of pain throughout session.    PT Treatment/Interventions ADLs/Self Care Home Management;Cryotherapy;Electrical Stimulation;Moist Heat;Traction;Therapeutic activities;Therapeutic exercise;Neuromuscular re-education;Patient/family education;Manual techniques;Passive range of motion;Dry needling;Taping    PT Next Visit Plan standing hip strengthening, SL balance    PT Home Exercise Plan Access Code: [email or web address]    Consulted and Agree with Plan of Care Patient           Patient will benefit from skilled therapeutic intervention in order to improve the following deficits and impairments:  Abnormal  gait,Decreased  strength,Difficulty walking,Postural dysfunction,Impaired flexibility  Visit Diagnosis: Chronic left-sided low back pain with left-sided sciatica  Muscle weakness (generalized)  Difficulty in walking, not elsewhere classified     Problem List Patient Active Problem List   Diagnosis Date Noted  . Acute pansinusitis 05/02/2020  . Body mass index (BMI) 37.0-37.9, adult 04/17/2020  . 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 Back pain with left-sided radiculopathy 03/16/2020  . Foot contusion 12/31/2019  . Pain in finger of right hand 04/14/2019  . Lumbar radiculopathy 01/02/2019  . Hypertensive disorder 10/12/2015  . 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 Migraine headache 09/04/2012  . Hyperlipidemia LDL goal <100 05/11/2012  . Vitamin D deficiency 04/11/2011  . Morbid obesity ([place]) 06/01/2007  Gwendolyn Grant, PT, DPT, ATC 06/15/20 6:04 PM  [place] [facility] [address] [place], [place], [address] Phone: [phone]   Fax:  [phone]  Name: Bridget Roberts MRN: [id number] Date of Birth: 06/24/1965

## 2020-06-20 ENCOUNTER — Other Ambulatory Visit: Payer: Self-pay

## 2020-06-20 ENCOUNTER — Ambulatory Visit: Payer: 59

## 2020-06-20 DIAGNOSIS — M5442 Lumbago with sciatica, left side: Secondary | ICD-10-CM | POA: Diagnosis not present

## 2020-06-20 DIAGNOSIS — M6281 Muscle weakness (generalized): Secondary | ICD-10-CM

## 2020-06-20 DIAGNOSIS — R262 Difficulty in walking, not elsewhere classified: Secondary | ICD-10-CM

## 2020-06-20 DIAGNOSIS — G8929 Other chronic pain: Secondary | ICD-10-CM

## 2020-06-20 NOTE — Therapy (Signed)
[place], [place], [address] Phone: [phone]   Fax:  [phone]  Physical Therapy Treatment  Patient Details  Name: Bridget Roberts MRN: [id number] Date of Birth: 1966/01/29 Referring Provider (PT): Consuella Lose, MD   Encounter Date: 06/20/2020   PT End of Session - 06/20/20 1703    Visit Number 13    Number of Visits 17    Date for PT Re-Evaluation 06/27/20    Authorization Type UHC    PT Start Time 1703    PT Stop Time 1744    PT Time Calculation (min) 41 min    Activity Tolerance Patient tolerated treatment well    Behavior During Therapy [facility] for tasks assessed/performed           Past Medical History:  Diagnosis Date  . Asthma   . Back pain   . GERD (gastroesophageal reflux disease)   . Headache(784.0)   . Hypertension   . Migraines   . PONV (postoperative nausea and vomiting)     Past Surgical History:  Procedure Laterality Date  . ABDOMINAL HYSTERECTOMY    . ECTOPIC PREGNANCY SURGERY    . ENDOMETRIAL ABLATION    . POLYPECTOMY     vocal chords  . SALPINGECTOMY  right   unilateral  . VAGINAL HYSTERECTOMY N/A 08/04/2013   Procedure: HYSTERECTOMY VAGINAL;  Surgeon: Florian Buff, MD;  Location: [facility];  Service: Gynecology;  Laterality: N/A;  . vocal chord polyp      There were no vitals filed for this visit.       [facility] PT Assessment - 06/20/20 0001      Special Tests   Other special tests LLD: ASIS to medial malleoli 87 cm Rt 85.5 cm Lt                         [facility] Adult PT Treatment/Exercise - 06/20/20 0001      Ambulation/Gait   Gait Comments trials of 25 ft pre and post heel lift      Lumbar Exercises: Aerobic   Nustep level 7 UE/LE 5 min      Knee/Hip Exercises: Standing   Hip Abduction 10 reps    Abduction Limitations x2; bilateral    Hip Extension 10 reps    Extension Limitations x2 bilateral    Forward Step Up 10 reps    Forward  Step Up Limitations 4 inch step; bilateral    SLS 3 x 30 sec each    Other Standing Knee Exercises marching 2 x 10                  PT Education - 06/20/20 1736    Education Details education on increasing wear time of heel lift as tolerated. updated HEP.    Person(s) Educated Patient    Methods Explanation;Verbal cues;Handout;Demonstration    Comprehension Verbalized understanding;Returned demonstration;Verbal cues required            PT Short Term Goals - 06/15/20 1719      PT SHORT TERM GOAL #1   Title Patient will be independent with initial HEP.    Time 2    Period Weeks    Status Achieved    Target Date 05/16/20      PT SHORT TERM GOAL #2   Title Therapist will review FOTO score and anticipated progress.    Baseline reviewed    Time 1    Period  Weeks    Status Achieved    Target Date 05/09/20      PT SHORT TERM GOAL #3   Title Patient will demonstrate at least 80 degrees with prone knee bend to decrease stress on low back.    Baseline 75 degrees bilaterally    Time 3    Period Weeks    Status Achieved    Target Date 05/24/20             PT Long Term Goals - 06/13/20 1726      PT LONG TERM GOAL #1   Title Patient will demonstrate at least 4+/5 in bilateral hip musculature to improve stability about the chain with prolonged walking and standing.    Baseline see flowsheet    Time 8    Period Weeks    Status Unable to assess      PT LONG TERM GOAL #2   Title Patient will score at least 60% function on FOTO to signify clinically meaningful improvement in functional abilities.    Baseline 70% 06/13/20    Time 8    Period Weeks    Status Achieved      PT LONG TERM GOAL #3   Title Patient will demonstrate normalized gait pattern.    Baseline antalgic gait    Time 8    Period Weeks    Status On-going      PT LONG TERM GOAL #4   Title Patient will self report ability to tolerate 30 minutes of standing activity with minimal pain (<3/10) to  improve tolerance to work specific activity.    Baseline intermittent pain    Time 8    Period Weeks    Status On-going                 Plan - 06/20/20 1725    Clinical Impression Statement Patient tolerated session well today without reports of pain. Patient noted to have leg length discrepancy when measured from ASIS to medial malleoli with Lt leg being shorter by 1.5 cm. Heel lift was issued with patient demonstrating improvements in gait mechanics with heel lift inserted as she has less of a lateral trunk lean during stance phase on the Lt. She tolerated standing strengthening exercises well demonstrating improved postural control and lumbopelvic stability.    PT Treatment/Interventions ADLs/Self Care Home Management;Cryotherapy;Electrical Stimulation;Moist Heat;Traction;Therapeutic activities;Therapeutic exercise;Neuromuscular re-education;Patient/family education;Manual techniques;Passive range of motion;Dry needling;Taping    PT Next Visit Plan re-eval. treadmill. tolerance to heel lift? standing hip strengthening, SL balance    PT Home Exercise Plan Access Code: [email or web address]    Consulted and Agree with Plan of Care Patient           Patient will benefit from skilled therapeutic intervention in order to improve the following deficits and impairments:  Abnormal gait,Decreased strength,Difficulty walking,Postural dysfunction,Impaired flexibility  Visit Diagnosis: Chronic left-sided low back pain with left-sided sciatica  Muscle weakness (generalized)  Difficulty in walking, not elsewhere classified     Problem List Patient Active Problem List   Diagnosis Date Noted  . Acute pansinusitis 05/02/2020  . Body mass index (BMI) 37.0-37.9, adult 04/17/2020  . 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 Back pain with left-sided radiculopathy 03/16/2020  . Foot contusion 12/31/2019  . Pain in finger of right hand 04/14/2019  . Lumbar radiculopathy 01/02/2019  . Hypertensive disorder  10/12/2015  . 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 Migraine headache 09/04/2012  . Hyperlipidemia LDL goal <100 05/11/2012  . Vitamin D deficiency 04/11/2011  .  Morbid obesity ([place]) 06/01/2007   Gwendolyn Grant, PT, DPT, ATC 06/20/20 5:49 PM  [facility] [address] [place], [place], [address] Phone: [phone]   Fax:  [phone]  Name: Bridget Roberts MRN: [id number] Date of Birth: Nov 25, 1965

## 2020-06-22 ENCOUNTER — Ambulatory Visit: Payer: 59

## 2020-06-22 DIAGNOSIS — G8929 Other chronic pain: Secondary | ICD-10-CM

## 2020-06-22 DIAGNOSIS — M6281 Muscle weakness (generalized): Secondary | ICD-10-CM

## 2020-06-22 DIAGNOSIS — M5442 Lumbago with sciatica, left side: Secondary | ICD-10-CM

## 2020-06-22 DIAGNOSIS — R262 Difficulty in walking, not elsewhere classified: Secondary | ICD-10-CM

## 2020-06-22 NOTE — Therapy (Signed)
[place], [place], [address] Phone: [phone]   Fax:  [phone]  Physical Therapy Treatment/Discharge   Patient Details  Name: Bridget Roberts MRN: [id number] Date of Birth: 01/10/1966 Referring Provider (PT): Consuella Lose, MD   Encounter Date: 06/22/2020   PT End of Session - 06/22/20 1702    Visit Number 14    Number of Visits 17    Date for PT Re-Evaluation 06/27/20    Authorization Type UHC    PT Start Time 1702    PT Stop Time 1745    PT Time Calculation (min) 43 min    Equipment Utilized During Treatment Other (comment)   heel lift Lt   Activity Tolerance Patient tolerated treatment well    Behavior During Therapy [facility] for tasks assessed/performed           Past Medical History:  Diagnosis Date  . Asthma   . Back pain   . GERD (gastroesophageal reflux disease)   . Headache(784.0)   . Hypertension   . Migraines   . PONV (postoperative nausea and vomiting)     Past Surgical History:  Procedure Laterality Date  . ABDOMINAL HYSTERECTOMY    . ECTOPIC PREGNANCY SURGERY    . ENDOMETRIAL ABLATION    . POLYPECTOMY     vocal chords  . SALPINGECTOMY  right   unilateral  . VAGINAL HYSTERECTOMY N/A 08/04/2013   Procedure: HYSTERECTOMY VAGINAL;  Surgeon: Florian Buff, MD;  Location: [facility];  Service: Gynecology;  Laterality: N/A;  . vocal chord polyp      There were no vitals filed for this visit.   Subjective Assessment - 06/22/20 1709    Subjective "I'm doing good. Not having any pain right now. The exercises are helping. I wore the lift for about 45 minutes at work and it made me sore." She feels that she is ready for discharge and can continue with her exercises independently.    How long can you sit comfortably? sitting is a relief, 2-3 hours    How long can you stand comfortably? "I stand all day at work"    How long can you walk comfortably? was able to walk on treadmill for  0.5 mile without difficulty    Currently in Pain? No/denies              [facility] PT Assessment - 06/22/20 0001      Observation/Other Assessments   Focus on Therapeutic Outcomes (FOTO)  76% function (name entered as Bridget Roberts in FOTO*)      AROM   Overall AROM Comments full and pain free trunk AROM      Strength   Right Hip Flexion 5/5    Right Hip Extension 5/5    Right Hip ABduction 5/5    Left Hip Flexion 5/5    Left Hip Extension 4+/5    Left Hip ABduction 4+/5    Right Knee Flexion 5/5    Right Knee Extension 5/5    Left Knee Flexion 5/5    Left Knee Extension 5/5    Right Ankle Dorsiflexion 5/5    Right Ankle Plantar Flexion 5/5    Right Ankle Inversion 5/5    Right Ankle Eversion 5/5    Left Ankle Dorsiflexion 5/5    Left Ankle Plantar Flexion 5/5    Left Ankle Inversion 5/5    Left Ankle Eversion 5/5      Flexibility   Hamstrings WNL bilaterally  Quadriceps 10% limitation bilaterally    Piriformis 50% limitation Lt, WNL Rt                         [facility] Adult PT Treatment/Exercise - 06/22/20 0001      Self-Care   Other Self-Care Comments  see patient education      Lumbar Exercises: Aerobic   Tread Mill level 3, 0.5 mile      Knee/Hip Exercises: Standing   Forward Step Up 5 reps    Forward Step Up Limitations 6 inch step with 5 lb kettlebell    Functional Squat 10 reps    Functional Squat Limitations x2 with 10 lb kettlebell    Other Standing Knee Exercises dead lift kettlebell 2 x 10 10lbs                  PT Education - 06/22/20 1843    Education Details D/C education. Education on overall progress and FOTO score. Updated HEP. Increasing wear time of heel lift.    Person(s) Educated Patient    Methods Explanation;Demonstration;Verbal cues;Handout    Comprehension Verbalized understanding;Returned demonstration            PT Short Term Goals - 06/15/20 1719      PT SHORT TERM GOAL #1   Title Patient will be  independent with initial HEP.    Time 2    Period Weeks    Status Achieved    Target Date 05/16/20      PT SHORT TERM GOAL #2   Title Therapist will review FOTO score and anticipated progress.    Baseline reviewed    Time 1    Period Weeks    Status Achieved    Target Date 05/09/20      PT SHORT TERM GOAL #3   Title Patient will demonstrate at least 80 degrees with prone knee bend to decrease stress on low back.    Baseline 75 degrees bilaterally    Time 3    Period Weeks    Status Achieved    Target Date 05/24/20             PT Long Term Goals - 06/22/20 1740      PT LONG TERM GOAL #1   Title Patient will demonstrate at least 4+/5 in bilateral hip musculature to improve stability about the chain with prolonged walking and standing.    Baseline see flowsheet    Time 8    Period Weeks    Status Achieved      PT LONG TERM GOAL #2   Title Patient will score at least 60% function on FOTO to signify clinically meaningful improvement in functional abilities.    Baseline 76% function    Time 8    Period Weeks    Status Achieved      PT LONG TERM GOAL #3   Title Patient will demonstrate normalized gait pattern.    Baseline normal gait on treadmill with heel lift inserted on Lt    Time 8    Period Weeks    Status Achieved      PT LONG TERM GOAL #4   Title Patient will self report ability to tolerate 30 minutes of standing activity with minimal pain (<3/10) to improve tolerance to work specific activity.    Baseline reports standing at work all day    Time 8    Period Weeks    Status Achieved  Plan - 06/22/20 1848    Clinical Impression Statement Patient has progressed well since start of care having met all established functional goals. She has full and pain free trunk AROM upon today's assessment and has reported no pain at the last 3 sessions. Her hip strength has significantly increased and her tolerance to standing/walking activity has much  improved. She is therefore appropriate for discharge to advanced home program at this time.    PT Treatment/Interventions ADLs/Self Care Home Management;Cryotherapy;Electrical Stimulation;Moist Heat;Traction;Therapeutic activities;Therapeutic exercise;Neuromuscular re-education;Patient/family education;Manual techniques;Passive range of motion;Dry needling;Taping    PT Next Visit Plan --    PT Home Exercise Plan Access Code: [email or web address]    Consulted and Agree with Plan of Care Patient           Patient will benefit from skilled therapeutic intervention in order to improve the following deficits and impairments:  Abnormal gait,Decreased strength,Difficulty walking,Postural dysfunction,Impaired flexibility  Visit Diagnosis: Chronic left-sided low back pain with left-sided sciatica  Muscle weakness (generalized)  Difficulty in walking, not elsewhere classified     Problem List Patient Active Problem List   Diagnosis Date Noted  . Acute pansinusitis 05/02/2020  . Body mass index (BMI) 37.0-37.9, adult 04/17/2020  . 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 Back pain with left-sided radiculopathy 03/16/2020  . Foot contusion 12/31/2019  . Pain in finger of right hand 04/14/2019  . Lumbar radiculopathy 01/02/2019  . Hypertensive disorder 10/12/2015  . 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 Migraine headache 09/04/2012  . Hyperlipidemia LDL goal <100 05/11/2012  . Vitamin D deficiency 04/11/2011  . Morbid obesity ([place]) 06/01/2007   PHYSICAL THERAPY DISCHARGE SUMMARY  Visits from Start of Care: 14  Current functional level related to goals / functional outcomes: See above   Remaining deficits: See above   Education / Equipment: See above   Plan: Patient agrees to discharge.  Patient goals were met. Patient is being discharged due to meeting the stated rehab goals.  ?????        Gwendolyn Grant, PT, DPT, ATC 06/22/20 7:00 PM  [facility] [address] [place], [place], [address] Phone: [phone]   Fax:  [phone]  Name: Bridget Roberts MRN: [id number] Date of Birth: [birth year]

## 2020-07-25 ENCOUNTER — Ambulatory Visit
Admission: EM | Admit: 2020-07-25 | Discharge: 2020-07-25 | Disposition: A | Discharge status: Home / Self Care | Payer: 59 | Attending: Family Medicine | Admitting: Family Medicine

## 2020-07-25 ENCOUNTER — Encounter: Payer: Self-pay | Admitting: Emergency Medicine

## 2020-07-25 DIAGNOSIS — I10 Essential (primary) hypertension: Secondary | ICD-10-CM

## 2020-07-25 DIAGNOSIS — G43819 Other migraine, intractable, without status migrainosus: Secondary | ICD-10-CM | POA: Diagnosis not present

## 2020-07-25 MED ORDER — KETOROLAC TROMETHAMINE 60 MG/2ML IM SOLN
60.0000 mg | Freq: Once | INTRAMUSCULAR | Status: AC
  Administered 2020-07-25: 60 mg via INTRAMUSCULAR

## 2020-07-25 MED ORDER — ONDANSETRON 4 MG PO TBDP
4.0000 mg | ORAL_TABLET | Freq: Once | ORAL | Status: AC
  Administered 2020-07-25: 4 mg via ORAL

## 2020-07-25 MED ORDER — DEXAMETHASONE SODIUM PHOSPHATE 10 MG/ML IJ SOLN
10.0000 mg | Freq: Once | INTRAMUSCULAR | Status: AC
  Administered 2020-07-25: 10 mg via INTRAMUSCULAR

## 2020-07-25 NOTE — ED Provider Notes (Signed)
[place]   [id number] 07/25/20 Arrival Time: 5277  ASSESSMENT & PLAN:  1. Other migraine without status migrainosus, intractable   2. Elevated blood pressure reading in office with diagnosis of hypertension    Meds ordered this encounter  Medications   dexamethasone (DECADRON) injection 10 mg   ketorolac (TORADOL) injection 60 mg   ondansetron (ZOFRAN-ODT) disintegrating tablet 4 mg   Normal neurological exam. Afebrile without nuchal rigidity. Current presentation and symptoms are consistent with prior migraines and are not consistent with SAH, ICH, meningitis, or temporal arteritis. No indication for neurodiagnostic workup at this time. Discussed.  Work note provided.  Reviewed expectations re: course of current medical issues. Questions answered. Outlined signs and symptoms indicating need for more acute intervention. Patient verbalized understanding. After Visit Summary given.   SUBJECTIVE: History from: Patient Patient is able to give a clear and coherent history.  Bridget Roberts is a 55 y.o. female who presents with complaint of a migraine headache. Onset gradual, 4 d ago. Location:  L frontal  without radiation. History of headaches: yes; same symptoms. Precipitating factors include: none which have been determined. Associated symptoms: Preceding aura: no. Nausea/vomiting: mild nausea. Vision changes: no. Increased sensitivity to light and to noises: yes. Fever: no. Sinus pressure/congestion: no. Extremity weakness: no. Home treatment has included  Imitrex  with no improvement. Current headache has limited normal daily activities; missed work. Denies dizziness, loss of balance, muscle weakness, numbness of extremities, and vision problems. No head injury reported. Ambulatory without difficulty. No recent travel.  OBJECTIVE:  Vitals:   07/25/20 1752  BP: (!) 160/106  Pulse: 87  Resp: 18  Temp: 97.9 F (36.6 C)  TempSrc: Oral  SpO2: 97%     General appearance: alert; NAD; appears fatigued HENT: normocephalic; atraumatic Eyes: PERRLA; EOMI; conjunctivae normal Neck: supple with FROM Lungs: unlabored respirations Extremities: no edema; symmetrical with no gross deformities Skin: warm and dry Neurologic: alert; speech is fluent and clear without dysarthria or aphasia; CN 2-12 grossly intact; no facial droop; normal gait Psychological: alert and cooperative; normal mood and affect   Allergies  Allergen Reactions   Penicillins Rash    Past Medical History:  Diagnosis Date   Asthma    Back pain    GERD (gastroesophageal reflux disease)    Headache(784.0)    Hypertension    Migraines    PONV (postoperative nausea and vomiting)    Social History   Socioeconomic History   Marital status: Single    Spouse name: Not on file   Number of children: Not on file   Years of education: Not on file   Highest education level: Not on file  Occupational History   Not on file  Tobacco Use   Smoking status: Never   Smokeless tobacco: Never  Vaping Use   Vaping Use: Never used  Substance and Sexual Activity   Alcohol use: No   Drug use: No   Sexual activity: Not Currently    Birth control/protection: Surgical    Comment: hyst  Other Topics Concern   Not on file  Social History Narrative   Not on file   Social Determinants of Health   Financial Resource Strain: Not on file  Food Insecurity: Not on file  Transportation Needs: Not on file  Physical Activity: Not on file  Stress: Not on file  Social Connections: Not on file  Intimate Partner Violence: Not on file   Family History  Problem Relation Age of Onset  Prostate cancer Father    Heart attack Mother    Hypertension Sister    Diabetes Sister    Hypertension Brother    Hypertension Sister    Hypertension Brother    Hypertension Brother    Other Son        MVA   Past Surgical History:  Procedure Laterality Date   ABDOMINAL HYSTERECTOMY     ECTOPIC  PREGNANCY SURGERY     ENDOMETRIAL ABLATION     POLYPECTOMY     vocal chords   SALPINGECTOMY  right   unilateral   VAGINAL HYSTERECTOMY N/A 08/04/2013   Procedure: HYSTERECTOMY VAGINAL;  Surgeon: Florian Buff, MD;  Location: [facility];  Service: Gynecology;  Laterality: N/A;   vocal chord polyp        Vanessa Kick, MD 07/25/20 1638

## 2020-07-25 NOTE — Discharge Instructions (Addendum)
Meds ordered this encounter  Medications   dexamethasone (DECADRON) injection 10 mg   ketorolac (TORADOL) injection 60 mg   ondansetron (ZOFRAN-ODT) disintegrating tablet 4 mg   Your blood pressure was noted to be elevated during your visit today. If you are currently taking medication for high blood pressure, please ensure you are taking this as directed. If you do not have a history of high blood pressure and your blood pressure remains persistently elevated, you may need to begin taking a medication at some point. You may return here within the next few days to recheck if unable to see your primary care provider or if you do not have a one.  BP (!) 160/106 (BP Location: Right Arm) Comment: has not taken bp meds today  Pulse 87   Temp 97.9 F (36.6 C) (Oral)   Resp 18   LMP 01/29/2013   SpO2 97%   BP Readings from Last 3 Encounters:  07/25/20 (!) 160/106  03/15/20 (!) 135/91  01/28/20 (!) 149/91

## 2020-07-25 NOTE — ED Triage Notes (Addendum)
Migraine since Friday.  Tried to make an appt at pcp but no appointments for 2 weeks.  Has taken imitrex at home with no relief.

## 2020-09-07 ENCOUNTER — Ambulatory Visit
Admission: EM | Admit: 2020-09-07 | Discharge: 2020-09-07 | Disposition: A | Discharge status: Home / Self Care | Payer: 59 | Attending: Emergency Medicine | Admitting: Emergency Medicine

## 2020-09-07 ENCOUNTER — Other Ambulatory Visit: Payer: Self-pay

## 2020-09-07 DIAGNOSIS — R6889 Other general symptoms and signs: Secondary | ICD-10-CM

## 2020-09-07 MED ORDER — BENZONATATE 100 MG PO CAPS: 100.0000 mg | ORAL_CAPSULE | Freq: Three times a day (TID) | ORAL | 0 refills | Status: DC

## 2020-09-07 MED ORDER — CETIRIZINE-PSEUDOEPHEDRINE ER 5-120 MG PO TB12: 1.0000 | ORAL_TABLET | Freq: Every day | ORAL | 0 refills | Status: DC

## 2020-09-07 NOTE — ED Provider Notes (Signed)
[facility]   [id number] 09/07/20 Arrival Time: 1552   CC: COVID symptoms  SUBJECTIVE: History from: patient.  Bridget Roberts is a 55 y.o. female who presents with nasal congestion, cough, chest congestion, and fatigue x 2 days ago.  Denies sick exposure to COVID, flu or strep.  Has tried OTC medications without relief.  Symptoms are made worse with work.  Denies previous symptoms in the past.   Denies fever, SOB, wheezing, chest pain, nausea, changes in bowel or bladder habits.     ROS: As per HPI.  All other pertinent ROS negative.     Past Medical History:  Diagnosis Date   Asthma    Back pain    GERD (gastroesophageal reflux disease)    Headache(784.0)    Hypertension    Migraines    PONV (postoperative nausea and vomiting)    Past Surgical History:  Procedure Laterality Date   ABDOMINAL HYSTERECTOMY     ECTOPIC PREGNANCY SURGERY     ENDOMETRIAL ABLATION     POLYPECTOMY     vocal chords   SALPINGECTOMY  right   unilateral   VAGINAL HYSTERECTOMY N/A 08/04/2013   Procedure: HYSTERECTOMY VAGINAL;  Surgeon: Florian Buff, MD;  Location: [facility];  Service: Gynecology;  Laterality: N/A;   vocal chord polyp     Allergies  Allergen Reactions   Penicillins Rash   No current facility-administered medications on file prior to encounter.   Current Outpatient Medications on File Prior to Encounter  Medication Sig Dispense Refill   albuterol (PROVENTIL HFA;VENTOLIN HFA) 108 (90 Base) MCG/ACT inhaler Inhale 2 puffs into the lungs every 6 (six) hours as needed for wheezing or shortness of breath. 1 Inhaler 0   cyclobenzaprine (FLEXERIL) 10 MG tablet Take 1 tablet (10 mg total) by mouth at bedtime. 30 tablet 3   ibuprofen (ADVIL) 800 MG tablet Take 1 tablet (800 mg total) by mouth every 12 (twelve) hours as needed. 30 tablet 0   ondansetron (ZOFRAN) 4 MG tablet Take 1 tablet (4 mg total) by mouth every 8 (eight) hours as needed for nausea or vomiting. 20 tablet 0    pantoprazole (PROTONIX) 40 MG tablet TAKE ONE TABLET DAILY FOR 2 WEEKS, THEN AS NEEDED, ON DAYS WHEN YOU TAKE IBUPROFEN 30 tablet 0   SUMAtriptan (IMITREX) 100 MG tablet One tablet at headache onset May repeat in 2 hours if headache persists or recurs. Maximum  Use is twice weekly 10 tablet 0   SUMAtriptan (IMITREX) 100 MG tablet Take one tablet at headache onset ,May repeat once in 2 hours if headache persists or recurs.Maximum use is two times per week 10 tablet 1   Vitamin D, Ergocalciferol, (DRISDOL) 1.25 MG (50000 UNIT) CAPS capsule TAKE 1 CAPSULE (50,000 UNITS TOTAL) BY MOUTH ONCE A WEEK. ONE CAPSULE ONCE WEEKLY 12 capsule 1   amLODipine (NORVASC) 5 MG tablet Take 1 tablet (5 mg total) by mouth daily. 180 tablet 3   Social History   Socioeconomic History   Marital status: Single    Spouse name: Not on file   Number of children: Not on file   Years of education: Not on file   Highest education level: Not on file  Occupational History   Not on file  Tobacco Use   Smoking status: Never   Smokeless tobacco: Never  Vaping Use   Vaping Use: Never used  Substance and Sexual Activity   Alcohol use: No   Drug use: No   Sexual  activity: Not Currently    Birth control/protection: Surgical    Comment: hyst  Other Topics Concern   Not on file  Social History Narrative   Not on file   Social Determinants of Health   Financial Resource Strain: Not on file  Food Insecurity: Not on file  Transportation Needs: Not on file  Physical Activity: Not on file  Stress: Not on file  Social Connections: Not on file  Intimate Partner Violence: Not on file   Family History  Problem Relation Age of Onset   Prostate cancer Father    Heart attack Mother    Hypertension Sister    Diabetes Sister    Hypertension Brother    Hypertension Sister    Hypertension Brother    Hypertension Brother    Other Son        MVA    OBJECTIVE:  Vitals:   09/07/20 1600 09/07/20 1601  BP:  (!) 153/88   Pulse:  80  Temp:  98.2 F (36.8 C)  TempSrc:  Oral  SpO2:  95%  Weight: 210 lb (95.3 kg)   Height: '5\' 4"'$  (1.626 m)      General appearance: alert; appears fatigued, but nontoxic; speaking in full sentences and tolerating own secretions HEENT: NCAT; Ears: EACs clear, TMs pearly gray; Eyes: PERRL.  EOM grossly intact. Nose: nares patent without rhinorrhea, Throat: oropharynx clear, tonsils non erythematous or enlarged, uvula midline  Neck: supple without LAD Lungs: unlabored respirations, symmetrical air entry; cough: absent; no respiratory distress; CTAB Heart: regular rate and rhythm.   Skin: warm and dry Psychological: alert and cooperative; normal mood and affect  ASSESSMENT & PLAN:  1. Flu-like symptoms     Meds ordered this encounter  Medications   benzonatate (TESSALON) 100 MG capsule    Sig: Take 1 capsule (100 mg total) by mouth every 8 (eight) hours.    Dispense:  21 capsule    Refill:  0    Order Specific Question:   Supervising Provider    Answer:   Raylene Everts [id number]   cetirizine-pseudoephedrine (ZYRTEC-D) 5-120 MG tablet    Sig: Take 1 tablet by mouth daily.    Dispense:  30 tablet    Refill:  0    Order Specific Question:   Supervising Provider    Answer:   Raylene Everts [id number]   Strep negative. Culture sent COVID testing ordered.  It will take between 5-7 days for test results.  Someone will contact you regarding abnormal results.    In the meantime: You should remain isolated in your home for 5 days from symptom onset AND greater than 72 hours after symptoms resolution (absence of fever without the use of fever-reducing medication and improvement in respiratory symptoms), whichever is longer Get plenty of rest and push fluids Tessalon Perles prescribed for cough Zyrtec D for congestion Use medications daily for symptom relief Use OTC medications like ibuprofen or tylenol as needed fever or pain Call or go to the ED if you have any new  or worsening symptoms such as fever, worsening cough, shortness of breath, chest tightness, chest pain, turning blue, changes in mental status, etc...   Reviewed expectations re: course of current medical issues. Questions answered. Outlined signs and symptoms indicating need for more acute intervention. Patient verbalized understanding. After Visit Summary given.          Lestine Box, PA-C 09/07/20 1615

## 2020-09-07 NOTE — Discharge Instructions (Signed)
Strep negative. Culture sent COVID testing ordered.  It will take between 5-7 days for test results.  Someone will contact you regarding abnormal results.    In the meantime: You should remain isolated in your home for 5 days from symptom onset AND greater than 72 hours after symptoms resolution (absence of fever without the use of fever-reducing medication and improvement in respiratory symptoms), whichever is longer Get plenty of rest and push fluids Tessalon Perles prescribed for cough Zyrtec D for congestion Use medications daily for symptom relief Use OTC medications like ibuprofen or tylenol as needed fever or pain Call or go to the ED if you have any new or worsening symptoms such as fever, worsening cough, shortness of breath, chest tightness, chest pain, turning blue, changes in mental status, etc..Marland Kitchen

## 2020-09-07 NOTE — ED Triage Notes (Signed)
2 negative covid test  Nasal congestion, cough, chest congestion. X2 days

## 2020-09-08 LAB — COVID-19, FLU A+B NAA
Influenza A, NAA: NOT DETECTED
Influenza B, NAA: NOT DETECTED
SARS-CoV-2, NAA: NOT DETECTED

## 2020-10-12 ENCOUNTER — Other Ambulatory Visit: Payer: Self-pay

## 2020-10-12 ENCOUNTER — Ambulatory Visit (INDEPENDENT_AMBULATORY_CARE_PROVIDER_SITE_OTHER): Payer: 59 | Admitting: Internal Medicine

## 2020-10-12 ENCOUNTER — Encounter: Payer: Self-pay | Admitting: Internal Medicine

## 2020-10-12 DIAGNOSIS — U071 COVID-19: Secondary | ICD-10-CM | POA: Diagnosis not present

## 2020-10-12 MED ORDER — ALBUTEROL SULFATE HFA 108 (90 BASE) MCG/ACT IN AERS: 2.0000 | INHALATION_SPRAY | Freq: Four times a day (QID) | RESPIRATORY_TRACT | 0 refills | Status: DC | PRN

## 2020-10-12 MED ORDER — NIRMATRELVIR/RITONAVIR (PAXLOVID)TABLET: 3.0000 | ORAL_TABLET | Freq: Two times a day (BID) | ORAL | 0 refills | Status: AC

## 2020-10-12 NOTE — Progress Notes (Signed)
Virtual Visit via Telephone Note   This visit type was conducted due to national recommendations for restrictions regarding the COVID-19 Pandemic (e.g. social distancing) in an effort to limit this patient's exposure and mitigate transmission in our community.  Due to her co-morbid illnesses, this patient is at least at moderate risk for complications without adequate follow up.  This format is felt to be most appropriate for this patient at this time.  The patient did not have access to video technology/had technical difficulties with video requiring transitioning to audio format only (telephone).  All issues noted in this document were discussed and addressed.  No physical exam could be performed with this format.  Evaluation Performed:  Follow-up visit  Date:  10/12/2020   ID:  Bridget Roberts, DOB [birth year], MRN [id number]  Patient Location: Home Provider Location: Office/Clinic  Participants: Patient Location of Patient: Home Location of Provider: Telehealth Consent was obtain for visit to be over via telehealth. I verified that I am speaking with the correct person using two identifiers.  PCP:  Fayrene Helper, MD   Chief Complaint:  Cough, fever and chills  History of Present Illness:    Bridget Roberts is a 55 y.o. female who has a televisit for c/o cough, nasal congestion, sore throat, fever, chills and myalgias for last 4 days. She tested positive for COVID 3 days ago. She has had 2 doses of COVID vaccine. She asks for refill of her Albuterol inhaler in case she needs it. Denies any wheezing currently. She is able to speak in full sentences.  The patient does have symptoms concerning for COVID-19 infection (fever, chills, cough, or new shortness of breath).   Past Medical, Surgical, Social History, Allergies, and Medications have been Reviewed.  Past Medical History:  Diagnosis Date   Asthma    Back pain    GERD (gastroesophageal reflux disease)     Headache(784.0)    Hypertension    Migraines    PONV (postoperative nausea and vomiting)    Past Surgical History:  Procedure Laterality Date   ABDOMINAL HYSTERECTOMY     ECTOPIC PREGNANCY SURGERY     ENDOMETRIAL ABLATION     POLYPECTOMY     vocal chords   SALPINGECTOMY  right   unilateral   VAGINAL HYSTERECTOMY N/A 08/04/2013   Procedure: HYSTERECTOMY VAGINAL;  Surgeon: Florian Buff, MD;  Location: [facility];  Service: Gynecology;  Laterality: N/A;   vocal chord polyp       Current Meds  Medication Sig   benzonatate (TESSALON) 100 MG capsule Take 1 capsule (100 mg total) by mouth every 8 (eight) hours.   cetirizine-pseudoephedrine (ZYRTEC-D) 5-120 MG tablet Take 1 tablet by mouth daily.   cyclobenzaprine (FLEXERIL) 10 MG tablet Take 1 tablet (10 mg total) by mouth at bedtime.   ibuprofen (ADVIL) 800 MG tablet Take 1 tablet (800 mg total) by mouth every 12 (twelve) hours as needed.   nirmatrelvir/ritonavir EUA (PAXLOVID) 20 x 150 MG & 10 x '100MG'$  TABS Take 3 tablets by mouth 2 (two) times daily for 5 days. (Take nirmatrelvir 150 mg two tablets twice daily for 5 days and ritonavir 100 mg one tablet twice daily for 5 days) Patient GFR is 91.   ondansetron (ZOFRAN) 4 MG tablet Take 1 tablet (4 mg total) by mouth every 8 (eight) hours as needed for nausea or vomiting.   pantoprazole (PROTONIX) 40 MG tablet TAKE ONE TABLET DAILY FOR 2 WEEKS, THEN AS NEEDED,  ON DAYS WHEN YOU TAKE IBUPROFEN   SUMAtriptan (IMITREX) 100 MG tablet One tablet at headache onset May repeat in 2 hours if headache persists or recurs. Maximum  Use is twice weekly (Patient taking differently: One tablet at headache onset May repeat in 2 hours if headache persists or recurs. Maximum  Use is twice weekly)     Allergies:   Penicillins   ROS:   Please see the history of present illness.     All other systems reviewed and are negative.   Labs/Other Tests and Data Reviewed:    Recent Labs: 03/17/2020: ALT 18; BUN 11;  Creatinine, Ser 0.83; Hemoglobin 14.5; Platelets 312; Potassium 4.3; Sodium 141; TSH 2.140   Recent Lipid Panel Lab Results  Component Value Date/Time   CHOL 216 (H) 03/17/2020 08:47 AM   TRIG 73 03/17/2020 08:47 AM   HDL 72 03/17/2020 08:47 AM   CHOLHDL 3.0 03/17/2020 08:47 AM   CHOLHDL 3.0 01/01/2019 12:03 PM   LDLCALC 131 (H) 03/17/2020 08:47 AM   LDLCALC 121 (H) 01/01/2019 12:03 PM    Wt Readings from Last 3 Encounters:  09/07/20 210 lb (95.3 kg)  01/28/20 220 lb (99.8 kg)  01/14/20 217 lb (98.4 kg)    ASSESSMENT & PLAN:    COVID-19 infection Started Paxlovid Mucinex or Robitussin PRN Advised to contact if she has dyspnea or wheezing Continue to self-quarantine until at least 24-hour afebrile period  Time:   Today, I have spent 9 minutes reviewing the chart, including problem list, medications, and with the patient with telehealth technology discussing the above problems.   Medication Adjustments/Labs and Tests Ordered: Current medicines are reviewed at length with the patient today.  Concerns regarding medicines are outlined above.   Tests Ordered: No orders of the defined types were placed in this encounter.   Medication Changes: Meds ordered this encounter  Medications   albuterol (VENTOLIN HFA) 108 (90 Base) MCG/ACT inhaler    Sig: Inhale 2 puffs into the lungs every 6 (six) hours as needed for wheezing or shortness of breath.    Dispense:  1 each    Refill:  0   nirmatrelvir/ritonavir EUA (PAXLOVID) 20 x 150 MG & 10 x '100MG'$  TABS    Sig: Take 3 tablets by mouth 2 (two) times daily for 5 days. (Take nirmatrelvir 150 mg two tablets twice daily for 5 days and ritonavir 100 mg one tablet twice daily for 5 days) Patient GFR is 91.    Dispense:  30 tablet    Refill:  0     Note: This dictation was prepared with Dragon dictation along with smaller phrase technology. Similar sounding words can be transcribed inadequately or may not be corrected upon review. Any  transcriptional errors that result from this process are unintentional.      Disposition:  Follow up  Signed, Lindell Spar, MD  10/12/2020 8:44 AM     [facility]

## 2020-10-18 ENCOUNTER — Telehealth: Payer: Self-pay

## 2020-10-18 ENCOUNTER — Other Ambulatory Visit: Payer: Self-pay | Admitting: Internal Medicine

## 2020-10-18 DIAGNOSIS — U071 COVID-19: Secondary | ICD-10-CM

## 2020-10-18 MED ORDER — DEXAMETHASONE 6 MG PO TABS: 6.0000 mg | ORAL_TABLET | Freq: Every day | ORAL | 0 refills | Status: DC

## 2020-10-18 NOTE — Telephone Encounter (Signed)
Patient called she states she is still having pain with breathing and headaches no more fever or cold sweats asking for the scripts for steroids that was discussed with her phone visit on 10/12/20. Also requesting a work note as well ph# [phone]

## 2020-10-19 ENCOUNTER — Encounter: Payer: Self-pay | Admitting: *Deleted

## 2020-10-19 NOTE — Telephone Encounter (Signed)
Patient needs note extended until 10-23-20 she does not feel like going back to work please advise

## 2020-10-20 ENCOUNTER — Encounter: Payer: Self-pay | Admitting: *Deleted

## 2020-10-20 NOTE — Telephone Encounter (Signed)
Pt work note updated. She will come by office to pick up as she is having trouble with MyChart.

## 2020-10-27 ENCOUNTER — Encounter: Payer: Self-pay | Admitting: Family Medicine

## 2020-10-27 ENCOUNTER — Other Ambulatory Visit: Payer: Self-pay

## 2020-10-27 ENCOUNTER — Ambulatory Visit (HOSPITAL_COMMUNITY)
Admission: RE | Admit: 2020-10-27 | Discharge: 2020-10-27 | Disposition: A | Discharge status: Home / Self Care | Payer: 59 | Source: Ambulatory Visit | Attending: Family Medicine | Admitting: Family Medicine

## 2020-10-27 ENCOUNTER — Ambulatory Visit: Payer: 59 | Admitting: Family Medicine

## 2020-10-27 ENCOUNTER — Telehealth: Payer: Self-pay

## 2020-10-27 VITALS — BP 147/83 | HR 89 | Temp 98.6°F | Resp 20 | Ht 64.0 in | Wt 212.0 lb

## 2020-10-27 DIAGNOSIS — R059 Cough, unspecified: Secondary | ICD-10-CM

## 2020-10-27 DIAGNOSIS — R053 Chronic cough: Secondary | ICD-10-CM | POA: Diagnosis not present

## 2020-10-27 DIAGNOSIS — J4540 Moderate persistent asthma, uncomplicated: Secondary | ICD-10-CM | POA: Diagnosis present

## 2020-10-27 DIAGNOSIS — I10 Essential (primary) hypertension: Secondary | ICD-10-CM | POA: Diagnosis not present

## 2020-10-27 DIAGNOSIS — J45909 Unspecified asthma, uncomplicated: Secondary | ICD-10-CM

## 2020-10-27 DIAGNOSIS — U099 Post covid-19 condition, unspecified: Secondary | ICD-10-CM

## 2020-10-27 MED ORDER — BUDESONIDE-FORMOTEROL FUMARATE 80-4.5 MCG/ACT IN AERO: 2.0000 | INHALATION_SPRAY | Freq: Two times a day (BID) | RESPIRATORY_TRACT | 3 refills | Status: DC

## 2020-10-27 MED ORDER — MONTELUKAST SODIUM 10 MG PO TABS: 10.0000 mg | ORAL_TABLET | Freq: Every day | ORAL | 3 refills | Status: DC

## 2020-10-27 MED ORDER — PREDNISONE 5 MG PO TABS: 5.0000 mg | ORAL_TABLET | Freq: Two times a day (BID) | ORAL | 0 refills | Status: AC

## 2020-10-27 NOTE — Telephone Encounter (Signed)
Pt was here for a follow up visit with Dr. Moshe Cipro and states that she needs a letter signed by you that she was seen for Covid and tested positive and the dates she was taken out of work, and a copy of her visit with that for HR at her work.

## 2020-10-27 NOTE — Progress Notes (Signed)
Bridget Roberts     MRN: [id number]      DOB: Mar 23, 1965   HPI Bridget Roberts is here for follow up had a positive home covid test on 08/30 , 8/31  , has been taken out of work from [place] 1 to 12, states her return date from work was 10/17/2020, had to call in every day after that documenting ongoing symptoms, Dr Posey Pronto based on symptoms reported extended her workk excuse till 09/12, states when she went 09/12 she was told to return home for 2 days  she has been back  since 09/14  Reports new breathing difficulty since getting covid , short of breath with minimal activity and during converstaion. c/ochronic cough which is new since covid, has sputum in the mornoig ROS Denies recent fever or chills. Denies sinus pressure, nasal congestion, ear pain or sore throat. . Denies chest pains, palpitations and leg swelling Denies abdominal pain, nausea, vomiting,diarrhea or constipation.   Denies dysuria, frequency, hesitancy or incontinence. Denies joint pain, swelling and limitation in mobility. Denies headaches, seizures, numbness, or tingling. Denies depression, anxiety or insomnia. Denies skin break down or rash.   PE  BP (!) 147/83 (BP Location: Right Arm, Patient Position: Sitting, Cuff Size: Large)   Pulse 89   Temp 98.6 F (37 C)   Resp 20   Ht '5\' 4"'$  (1.626 m)   Wt 212 lb (96.2 kg)   LMP 01/29/2013   SpO2 93%   BMI 36.39 kg/m   Patient alert and oriented and in no cardiopulmonary distress.  HEENT: No facial asymmetry, EOMI,     Neck supple .  Chest: Clear to auscultation bilaterally.  CVS: S1, S2 no murmurs, no S3.Regular rate.  ABD: Soft non tender.   Ext: No edema  MS: Adequate ROM spine, shoulders, hips and knees.  Skin: Intact, no ulcerations or rash noted.  Psych: Good eye contact, normal affect. Memory intact not anxious or depressed appearing.  CNS: CN 2-12 intact, power,  normal throughout.no focal deficits noted.   Assessment & Plan  Essential  hypertension unControlled, no change in medication per patient decision, I have advised dose incrase, states wants to work on l lifestyle, re eval in 8 to 10 weeks DASH diet and commitment to daily physical activity for a minimum of 30 minutes discussed and encouraged, as a part of hypertension management. The importance of attaining a healthy weight is also discussed.  BP/Weight 10/27/2020 09/07/2020 07/25/2020 03/15/2020 01/28/2020 01/14/2020 [id number]  Systolic BP [id number] 0000000 0000000 [id number] - [id number]  Diastolic BP 83 88 [id number] 91 91 - 90  Wt. (Lbs) 212 210 - - 220 217 217  BMI 36.39 36.05 - - 37.76 37.25 37.25       Intrinsic asthma Uncontrolled, add symbicort, singulair and short course of prednisone. 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  The importance of healthy food choices with portion control discussed, as well as eating regularly and within a 12 hour window most days. The need to choose "clean , green" food 50 to 75% of the time is discussed, as well as to make water the primary drink and set a goal of 64 ounces water daily.    Weight /BMI 10/27/2020 09/07/2020 01/28/2020  WEIGHT 212 lb 210 lb 220 lb  HEIGHT 5'  4" '5\' 4"'$  '5\' 4"'$   BMI 36.39 kg/m2 36.05 kg/m2 37.76 kg/m2

## 2020-10-27 NOTE — Patient Instructions (Addendum)
Follow-up in 8 to 10 weeks call if you need me sooner.  Chest x-ray today to evaluate chronic cough.  You are referred to pulmonary specialist regarding chronic uncontrolled cough following your recent COVID infection.  Appointment to be scheduled at the time of checkout if possible.  New medications for uncontrolled allergies and uncontrolled asthma.  These are once daily Singulair, twice daily Symbicort , and a 5-day course of prednisone.  Please schedule mammogram at checkout patient states she will have this done.  Colonoscopy is past due , you need his  I am concerned that your blood pressure is still not controlled and I recommend additional medication for this. Continue to work on lifestyle changes to improve health and bP  Congrats on weight  loss, keep it up  Thanks for choosing [facility], we consider it a privelige to serve you.

## 2020-10-30 ENCOUNTER — Telehealth: Payer: Self-pay | Admitting: Family Medicine

## 2020-10-30 NOTE — Telephone Encounter (Signed)
FMLA  Hypertension   Copied Noted Sleeved

## 2020-10-30 NOTE — Telephone Encounter (Signed)
FMLA- Sciatic Nerve Pain   Copied Noted Sleeved

## 2020-10-30 NOTE — Telephone Encounter (Signed)
FMLA- Migraines   Copied Noted Sleeved

## 2020-10-30 NOTE — Telephone Encounter (Signed)
FMLA - Asthma   Copied Noted sleeved

## 2020-10-31 ENCOUNTER — Encounter: Payer: Self-pay | Admitting: Family Medicine

## 2020-10-31 DIAGNOSIS — R053 Chronic cough: Secondary | ICD-10-CM | POA: Insufficient documentation

## 2020-10-31 DIAGNOSIS — U099 Post covid-19 condition, unspecified: Secondary | ICD-10-CM | POA: Insufficient documentation

## 2020-10-31 NOTE — Assessment & Plan Note (Signed)
Uncontrolled, add symbicort, singulair and short course of prednisone. Refer to pulmonary

## 2020-10-31 NOTE — Telephone Encounter (Signed)
Letter and OV printed, pt informed it is available for pick up.

## 2020-10-31 NOTE — Assessment & Plan Note (Signed)
unControlled, no change in medication per patient decision, I have advised dose incrase, states wants to work on l lifestyle, re eval in 8 to 10 weeks DASH diet and commitment to daily physical activity for a minimum of 30 minutes discussed and encouraged, as a part of hypertension management. The importance of attaining a healthy weight is also discussed.  BP/Weight 10/27/2020 09/07/2020 07/25/2020 03/15/2020 01/28/2020 01/14/2020 45/14/6047  Systolic BP 998 721 587 276 184 - 859  Diastolic BP 83 88 276 91 91 - 90  Wt. (Lbs) 212 210 - - 220 217 217  BMI 36.39 36.05 - - 37.76 37.25 37.25

## 2020-10-31 NOTE — Assessment & Plan Note (Signed)
  Patient re-educated about  the importance of commitment to a  minimum of 150 minutes of exercise per week as able.  The importance of healthy food choices with portion control discussed, as well as eating regularly and within a 12 hour window most days. The need to choose "clean , green" food 50 to 75% of the time is discussed, as well as to make water the primary drink and set a goal of 64 ounces water daily.    Weight /BMI 10/27/2020 09/07/2020 01/28/2020  WEIGHT 212 lb 210 lb 220 lb  HEIGHT 5\' 4"  5\' 4"  5\' 4"   BMI 36.39 kg/m2 36.05 kg/m2 37.76 kg/m2

## 2020-10-31 NOTE — Assessment & Plan Note (Signed)
Pulmonary to evaluate and manage

## 2020-11-06 ENCOUNTER — Ambulatory Visit (HOSPITAL_COMMUNITY)
Admission: RE | Admit: 2020-11-06 | Discharge: 2020-11-06 | Disposition: A | Discharge status: Home / Self Care | Payer: 59 | Source: Ambulatory Visit | Attending: Family Medicine | Admitting: Family Medicine

## 2020-11-06 ENCOUNTER — Other Ambulatory Visit: Payer: Self-pay

## 2020-11-06 DIAGNOSIS — Z1231 Encounter for screening mammogram for malignant neoplasm of breast: Secondary | ICD-10-CM | POA: Insufficient documentation

## 2020-11-08 ENCOUNTER — Other Ambulatory Visit: Payer: Self-pay | Admitting: Internal Medicine

## 2020-11-08 DIAGNOSIS — U071 COVID-19: Secondary | ICD-10-CM

## 2020-11-16 ENCOUNTER — Telehealth: Payer: Self-pay

## 2020-11-16 ENCOUNTER — Encounter: Payer: Self-pay | Admitting: Family Medicine

## 2020-11-16 ENCOUNTER — Other Ambulatory Visit: Payer: Self-pay

## 2020-11-16 ENCOUNTER — Ambulatory Visit: Payer: 59 | Admitting: Family Medicine

## 2020-11-16 VITALS — BP 147/85 | HR 75 | Resp 16 | Ht 64.0 in | Wt 213.0 lb

## 2020-11-16 DIAGNOSIS — J45909 Unspecified asthma, uncomplicated: Secondary | ICD-10-CM | POA: Diagnosis not present

## 2020-11-16 DIAGNOSIS — Z0289 Encounter for other administrative examinations: Secondary | ICD-10-CM

## 2020-11-16 DIAGNOSIS — Z0279 Encounter for issue of other medical certificate: Secondary | ICD-10-CM

## 2020-11-16 DIAGNOSIS — G43001 Migraine without aura, not intractable, with status migrainosus: Secondary | ICD-10-CM | POA: Diagnosis not present

## 2020-11-16 NOTE — Patient Instructions (Addendum)
F/U as before, call if you need me sooner  Thanks for choosing [facility], we consider it a privelige to serve you.

## 2020-11-16 NOTE — Telephone Encounter (Signed)
Sent note from file confirming Bridget Roberts had appt today.

## 2020-11-17 ENCOUNTER — Telehealth: Payer: Self-pay

## 2020-11-17 NOTE — Telephone Encounter (Signed)
Bridget Roberts called and asked that I print the note I types for her last night.  I printed it and left it at the front desk for her.

## 2020-11-19 ENCOUNTER — Encounter: Payer: Self-pay | Admitting: Family Medicine

## 2020-11-19 DIAGNOSIS — Z0289 Encounter for other administrative examinations: Secondary | ICD-10-CM | POA: Insufficient documentation

## 2020-11-19 NOTE — Assessment & Plan Note (Signed)
Forms completed x 4  for FMLA  And originals presebnte dto patient at checkout, Back pain, asthma, HTN and migraine

## 2020-11-19 NOTE — Progress Notes (Signed)
   SCARLET ABAD     MRN: [id number]      DOB: [birth year]   HPI Ms. Igarashi is here for [employer] form completion for hypertension, migraine headaches, back pain and asthma. States she needs 4 separate forms for her job Pertinent medical history and forms are completed at the visit Declined flu vaccine, will get on her job\No health concerns discussed, no complaints voiced  ROS See HPI    PE  BP (!) 147/85   Pulse 75   Resp 16   Ht 5\' 4"  (1.626 m)   Wt 213 lb (96.6 kg)   LMP 01/29/2013   SpO2 97%   BMI 36.56 kg/m   Patient alert and oriented and in no cardiopulmonary distress.      Assessment & Plan  Encounter for completion of form with patient Forms completed x 4  for FMLA  And originals presebnte dto patient at checkout, Back pain, asthma, HTN and migraine

## 2020-11-29 NOTE — Telephone Encounter (Signed)
Called patient lvm forms updated # 9 (page 3 or 4) are ready for pick up.

## 2020-12-14 ENCOUNTER — Encounter: Payer: Self-pay | Admitting: Internal Medicine

## 2020-12-14 ENCOUNTER — Other Ambulatory Visit: Payer: Self-pay

## 2020-12-14 ENCOUNTER — Ambulatory Visit (INDEPENDENT_AMBULATORY_CARE_PROVIDER_SITE_OTHER): Payer: 59 | Admitting: Internal Medicine

## 2020-12-14 DIAGNOSIS — J45991 Cough variant asthma: Secondary | ICD-10-CM

## 2020-12-14 NOTE — Progress Notes (Signed)
Bridget Roberts, female    DOB: [birth year],    MRN: [id number]   Brief patient profile:  38 yobf  never smoker but required inhalers intermittently  "as long as can remember " esp spring > fall  referred to pulmonary clinic in [place]  12/14/2020 by Dr Bari Mantis with new dx of persistent asthma requring symbicort 80 2 puffs daily ever since Covid in late August p receiving vax x 2    Had vc surgery wolicki's in her 76L seemed to help cough more than gerd rx but no f/u since then   History of Present Illness  12/14/2020  Pulmonary/ 1st office eval/ Melvyn [facility] / [facility]  Chief Complaint  Patient presents with   Consult    After covid in 8/22 needs inhaler everyday. wheezing  Dyspnea: on her feet all day ok / no steps required Cough: no production but frequent throat clearing and saba use at ov induced coughing fits  Sleep: able to lie flat ok with couple of pillows s resp cc  SABA use: none   No obvious day to day or daytime variability or assoc excess/ purulent sputum or mucus plugs or hemoptysis or cp or chest tightness,   or overt sinus or hb symptoms.   Sleeping  without nocturnal  or early am exacerbation  of respiratory  c/o's or need for noct saba. Also denies any obvious fluctuation of symptoms with weather or environmental changes or other aggravating or alleviating factors except as outlined above   No unusual exposure hx or h/o childhood pna/ asthma or knowledge of premature birth.  Current Allergies, Complete Past Medical History, Past Surgical History, Family History, and Social History were reviewed in [employer] record.  ROS  The following are not active complaints unless bolded Hoarseness, sore throat, dysphagia, dental problems, itching, sneezing,  nasal congestion or discharge of excess mucus or purulent secretions, ear ache,   fever, chills, sweats, unintended wt loss or wt gain, classically pleuritic or exertional cp,  orthopnea pnd or  arm/hand swelling  or leg swelling, presyncope, palpitations, abdominal pain, anorexia, nausea, vomiting, diarrhea  or change in bowel habits or change in bladder habits, change in stools or change in urine, dysuria, hematuria,  rash, arthralgias, visual complaints, headache, numbness, weakness or ataxia or problems with walking or coordination,  change in mood or  memory.           Past Medical History:  Diagnosis Date   Asthma    Back pain    GERD (gastroesophageal reflux disease)    Headache(784.0)    Hypertension    Migraines    PONV (postoperative nausea and vomiting)     Outpatient Medications Prior to Visit  Medication Sig Dispense Refill   albuterol (VENTOLIN HFA) 108 (90 Base) MCG/ACT inhaler TAKE 2 PUFFS BY MOUTH EVERY 6 HOURS AS NEEDED FOR WHEEZE OR SHORTNESS OF BREATH 6.7 each 2   budesonide-formoterol (SYMBICORT) 80-4.5 MCG/ACT inhaler Inhale 2 puffs into the lungs 2 (two) times daily. 1 each 3   cetirizine-pseudoephedrine (ZYRTEC-D) 5-120 MG tablet Take 1 tablet by mouth daily. 30 tablet 0   cyclobenzaprine (FLEXERIL) 10 MG tablet Take 1 tablet (10 mg total) by mouth at bedtime. 30 tablet 3   montelukast (SINGULAIR) 10 MG tablet Take 1 tablet (10 mg total) by mouth at bedtime. 30 tablet 3   pantoprazole (PROTONIX) 40 MG tablet TAKE ONE TABLET DAILY FOR 2 WEEKS, THEN AS NEEDED, ON DAYS WHEN YOU TAKE  IBUPROFEN 30 tablet 0   SUMAtriptan (IMITREX) 100 MG tablet One tablet at headache onset May repeat in 2 hours if headache persists or recurs. Maximum  Use is twice weekly (Patient taking differently: One tablet at headache onset May repeat in 2 hours if headache persists or recurs. Maximum  Use is twice weekly) 10 tablet 0   amLODipine (NORVASC) 5 MG tablet Take 1 tablet (5 mg total) by mouth daily. 180 tablet 3   No facility-administered medications prior to visit.     Objective:     BP 138/88 (BP Location: Left Arm, Patient Position: Sitting)   Pulse 84   Temp 98.4 F  (36.9 C)   Ht 5\' 4"  (1.626 m)   Wt 211 lb 6.4 oz (95.9 kg)   LMP 01/29/2013   SpO2 99% Comment: ra  BMI 36.29 kg/m   SpO2: 99 % (ra)  Amb bf nad   HEENT : pt wearing mask not removed for exam due to covid -19 concerns.    NECK :  without JVD/Nodes/TM/ nl carotid upstrokes bilaterally   LUNGS: no acc muscle use,  Nl contour chest which is clear to A and P bilaterally without cough on insp or exp maneuvers   CV:  RRR  no s3 or murmur or increase in P2, and no edema   ABD:  soft and nontender with nl inspiratory excursion in the supine position. No bruits or organomegaly appreciated, bowel sounds nl  MS:  Nl gait/ ext warm without deformities, calf tenderness, cyanosis or clubbing No obvious joint restrictions   SKIN: warm and dry without lesions    NEURO:  alert, approp, nl sensorium with  no motor or cerebellar deficits apparent.    I personally reviewed images and agree with radiology impression as follows:  CXR:   pa and lateral 10/27/20 No active cardiopulmonary disease.     Assessment   No problem-specific Assessment & Plan notes found for this encounter.     Christinia Gully, MD 12/14/2020

## 2020-12-14 NOTE — Patient Instructions (Addendum)
Plan A = Automatic = Always=   Symbicort 80 Take 2 puffs first thing in am and then another 2 puffs about 12 hours later.    Work on inhaler technique:  relax and gently blow all the way out then take a nice smooth full deep breath back in, triggering the inhaler at same time you start breathing in.  Hold for up to 5 seconds if you can. Blow out thru nose. Rinse and gargle with water when done.  If mouth or throat bother you at all,  try brushing teeth/gums/tongue with arm and hammer toothpaste/ make a slurry and gargle and spit out.       Plan B = Backup (to supplement plan A, not to replace it) Only use your albuterol inhaler as a rescue medication to be used if you can't catch your breath by resting or doing a relaxed purse lip breathing pattern.  - The less you use it, the better it will work when you need it. - Ok to use the inhaler up to 2 puffs  every 4 hours if you must but call for appointment if use goes up over your usual need - Don't leave home without it !!  (think of it like the spare tire for your car)    Please remember to go to the lab department   for your tests - we will call you with the results when they are available.      Please schedule a follow up visit in 3 months but call sooner if needed

## 2020-12-14 NOTE — Assessment & Plan Note (Addendum)
Onset in childhood  - Had vc surgery wolicki's in her 34I helped cough  - Allergy profile 12/14/2020 >  Eos 0. /  IgE  - 12/14/2020  After extensive coaching inhaler device,  effectiveness =    75% (short ti)  > try symbicort 80 2bid for at least a week  Day > noct cough worse with saba is not suggestive of asthma as much as Upper airway cough syndrome (previously labeled PNDS),  is so named because it's frequently impossible to sort out how much is  CR/sinusitis with freq throat clearing (which can be related to primary GERD)   vs  causing  secondary (" extra esophageal")  GERD from wide swings in gastric pressure that occur with throat clearing, often  promoting self use of mint and menthol lozenges that reduce the lower esophageal sphincter tone and exacerbate the problem further in a cyclical fashion.   These are the same pts (now being labeled as having "irritable larynx syndrome" by some cough centers) who not infrequently have a history of having failed to tolerate ace inhibitors,  dry powder inhalers or biphosphonates or report having atypical/extraesophageal reflux symptoms that don't respond to standard doses of PPI  and are easily confused as having aecopd or asthma flares by even experienced allergists/ pulmonologists (myself included).   However, she's convinced symbicort 80 2 each am is helping despite poor technique and that gerd rx did not help previously so rec trial of symbicort 80 2bid x at least a week to see how much is asthma and if it's more uacs then likely to worsen on symbicort and will need re-eval by ent or, if pos atopic on screen, referral to allergy          Each maintenance medication was reviewed in detail including emphasizing most importantly the difference between maintenance and prns and under what circumstances the prns are to be triggered using an action plan format where appropriate.  Total time for H and P, chart review, counseling, reviewing hfa device(s) and  generating customized AVS unique to this new pt office visit / same day charting  > 45 min

## 2020-12-21 LAB — CBC WITH DIFFERENTIAL/PLATELET
Basophils Absolute: 0.1 10*3/uL (ref 0.0–0.2)
Basos: 1 %
EOS (ABSOLUTE): 0.2 10*3/uL (ref 0.0–0.4)
Eos: 3 %
Hematocrit: 41.4 % (ref 34.0–46.6)
Hemoglobin: 13.7 g/dL (ref 11.1–15.9)
Immature Grans (Abs): 0 10*3/uL (ref 0.0–0.1)
Immature Granulocytes: 0 %
Lymphocytes Absolute: 2.9 10*3/uL (ref 0.7–3.1)
Lymphs: 34 %
MCH: 28.2 pg (ref 26.6–33.0)
MCHC: 33.1 g/dL (ref 31.5–35.7)
MCV: 85 fL (ref 79–97)
Monocytes Absolute: 0.6 10*3/uL (ref 0.1–0.9)
Monocytes: 7 %
Neutrophils Absolute: 4.7 10*3/uL (ref 1.4–7.0)
Neutrophils: 55 %
Platelets: 342 10*3/uL (ref 150–450)
RBC: 4.86 x10E6/uL (ref 3.77–5.28)
RDW: 13.4 % (ref 11.7–15.4)
WBC: 8.6 10*3/uL (ref 3.4–10.8)

## 2020-12-21 LAB — IGE: IgE (Immunoglobulin E), Serum: 149 IU/mL (ref 6–495)

## 2021-01-09 ENCOUNTER — Ambulatory Visit: Payer: 59 | Admitting: Family Medicine

## 2021-01-09 ENCOUNTER — Other Ambulatory Visit: Payer: Self-pay

## 2021-01-09 ENCOUNTER — Encounter: Payer: Self-pay | Admitting: Family Medicine

## 2021-01-09 VITALS — BP 159/91 | HR 71 | Ht 64.0 in | Wt 215.2 lb

## 2021-01-09 DIAGNOSIS — G43001 Migraine without aura, not intractable, with status migrainosus: Secondary | ICD-10-CM

## 2021-01-09 DIAGNOSIS — J45991 Cough variant asthma: Secondary | ICD-10-CM

## 2021-01-09 DIAGNOSIS — Z6837 Body mass index (BMI) 37.0-37.9, adult: Secondary | ICD-10-CM

## 2021-01-09 DIAGNOSIS — I10 Essential (primary) hypertension: Secondary | ICD-10-CM

## 2021-01-09 DIAGNOSIS — E785 Hyperlipidemia, unspecified: Secondary | ICD-10-CM

## 2021-01-09 DIAGNOSIS — M5410 Radiculopathy, site unspecified: Secondary | ICD-10-CM

## 2021-01-09 DIAGNOSIS — Z1211 Encounter for screening for malignant neoplasm of colon: Secondary | ICD-10-CM | POA: Diagnosis not present

## 2021-01-09 DIAGNOSIS — Z23 Encounter for immunization: Secondary | ICD-10-CM | POA: Diagnosis not present

## 2021-01-09 MED ORDER — AMLODIPINE BESYLATE 10 MG PO TABS: 10.0000 mg | ORAL_TABLET | Freq: Every day | ORAL | 5 refills | Status: DC

## 2021-01-09 NOTE — Assessment & Plan Note (Signed)
Hyperlipidemia:Low fat diet discussed and encouraged.   Lipid Panel  Lab Results  Component Value Date   CHOL 216 (H) 03/17/2020   HDL 72 03/17/2020   LDLCALC 131 (H) 03/17/2020   TRIG 73 03/17/2020   CHOLHDL 3.0 03/17/2020     Updated lab needed at/ before next visit.

## 2021-01-09 NOTE — Assessment & Plan Note (Signed)
Controlled, no change in medication  

## 2021-01-09 NOTE — Assessment & Plan Note (Signed)
  Patient re-educated about  the importance of commitment to a  minimum of 150 minutes of exercise per week as able.  The importance of healthy food choices with portion control discussed, as well as eating regularly and within a 12 hour window most days. The need to choose "clean , green" food 50 to 75% of the time is discussed, as well as to make water the primary drink and set a goal of 64 ounces water daily.    Weight /BMI 01/09/2021 12/14/2020 11/16/2020  WEIGHT 215 lb 2.6 oz 211 lb 6.4 oz 213 lb  HEIGHT 5\' 4"  5\' 4"  5\' 4"   BMI 36.93 kg/m2 36.29 kg/m2 36.56 kg/m2

## 2021-01-09 NOTE — Assessment & Plan Note (Signed)
Followed by Pulmonary, reports stable , no recent flares, she is compliant with medication

## 2021-01-09 NOTE — Patient Instructions (Addendum)
Follo up  end January, call if you need me sooner  New higher dose of amlodipine is 10 mg daily, blood pressure is still too high  Fasting CBC, lipid, cmp and EGFr, tSH and vit D 5  days before next appt  Pneumonia 20 today  Please get covid booster  You are referred for colonoscopy, very important that you get this Please give tele contact info and address for [facility] at checkout  Thanks for choosing [facility], we consider it a privelige to serve you.   Work note stating was in office this afternoon to be given prior to checkout

## 2021-01-09 NOTE — Progress Notes (Signed)
Bridget Roberts     MRN: [id number]      DOB: 12/19/1965   HPI Bridget Roberts is here for follow up and re-evaluation of chronic medical conditions, medication management and review of any available recent lab and radiology data.  Preventive health is updated, specifically  Cancer screening and Immunization.   Questions or concerns regarding consultations or procedures which the PT has had in the interim are  addressed. The PT denies any adverse reactions to current medications since the last visit.  Having minor back pain flare in past 2 weeks, no inciting factor, had 1 epidural approx 11 months ago which was beneficial   ROS Denies recent fever or chills. Denies sinus pressure, nasal congestion, ear pain or sore throat. Denies chest congestion, productive cough or wheezing. Denies chest pains, palpitations and leg swelling Denies abdominal pain, nausea, vomiting,diarrhea or constipation.   Denies dysuria, frequency, hesitancy or incontinence. . Denies headaches, seizures, numbness, or tingling. Denies depression, anxiety or insomnia. Denies skin break down or rash.   PE  BP (!) 159/91   Pulse 71   Ht 5\' 4"  (1.626 m)   Wt 215 lb 2.6 oz (97.6 kg)   LMP 01/29/2013   SpO2 (!) 42%   BMI 36.93 kg/m   Patient alert and oriented and in no cardiopulmonary distress.  HEENT: No facial asymmetry, EOMI,     Neck supple .  Chest: Clear to auscultation bilaterally.  CVS: S1, S2 no murmurs, no S3.Regular rate.  Ext: No edema  MS: Adequate  though reduced ROM spine, shoulders, hips and knees.  Skin: Intact, no ulcerations or rash noted.  Psych: Good eye contact, normal affect. Memory intact not anxious or depressed appearing.  CNS: CN 2-12 intact, power,  normal throughout.no focal deficits noted.   Assessment & Plan  Essential hypertension Uncontrolled, inc amlodipine dose DASH diet and commitment to daily physical activity for a minimum of 30 minutes discussed and  encouraged, as a part of hypertension management. The importance of attaining a healthy weight is also discussed.  BP/Weight 01/09/2021 12/14/2020 11/16/2020 10/27/2020 09/07/2020 08/08/3660 10/15/7652  Systolic BP 650 354 656 812 751 700 174  Diastolic BP 91 88 85 83 88 106 91  Wt. (Lbs) 215.16 211.4 213 212 210 - -  BMI 36.93 36.29 36.56 36.39 36.05 - -       Cough variant asthma vs upper airway cough syndrome Followed by Pulmonary, reports stable , no recent flares, she is compliant with medication  Body mass index (BMI) 37.0-37.9, adult  Patient re-educated about  the importance of commitment to a  minimum of 150 minutes of exercise per week as able.  The importance of healthy food choices with portion control discussed, as well as eating regularly and within a 12 hour window most days. The need to choose "clean , green" food 50 to 75% of the time is discussed, as well as to make water the primary drink and set a goal of 64 ounces water daily.    Weight /BMI 01/09/2021 12/14/2020 11/16/2020  WEIGHT 215 lb 2.6 oz 211 lb 6.4 oz 213 lb  HEIGHT 5\' 4"  5\' 4"  5\' 4"   BMI 36.93 kg/m2 36.29 kg/m2 36.56 kg/m2      Hyperlipidemia LDL goal <100 Hyperlipidemia:Low fat diet discussed and encouraged.   Lipid Panel  Lab Results  Component Value Date   CHOL 216 (H) 03/17/2020   HDL 72 03/17/2020   LDLCALC 131 (H) 03/17/2020   TRIG 73 03/17/2020  CHOLHDL 3.0 03/17/2020     Updated lab needed at/ before next visit.   Migraine headache Controlled, no change in medication   Back pain with left-sided radiculopathy Mild increase in pain in recent weeks, will get rept epidural if worsens

## 2021-01-09 NOTE — Assessment & Plan Note (Signed)
Uncontrolled, inc amlodipine dose DASH diet and commitment to daily physical activity for a minimum of 30 minutes discussed and encouraged, as a part of hypertension management. The importance of attaining a healthy weight is also discussed.  BP/Weight 01/09/2021 12/14/2020 11/16/2020 10/27/2020 09/07/2020 04/04/9796 10/13/1192  Systolic BP 174 081 448 185 631 497 026  Diastolic BP 91 88 85 83 88 106 91  Wt. (Lbs) 215.16 211.4 213 212 210 - -  BMI 36.93 36.29 36.56 36.39 36.05 - -

## 2021-01-09 NOTE — Assessment & Plan Note (Signed)
Mild increase in pain in recent weeks, will get rept epidural if worsens

## 2021-01-15 ENCOUNTER — Encounter: Payer: Self-pay | Admitting: Internal Medicine

## 2021-01-25 ENCOUNTER — Other Ambulatory Visit: Payer: Self-pay | Admitting: Family Medicine

## 2021-02-04 ENCOUNTER — Other Ambulatory Visit: Payer: Self-pay

## 2021-02-04 ENCOUNTER — Encounter (HOSPITAL_COMMUNITY): Payer: Self-pay

## 2021-02-04 ENCOUNTER — Emergency Department (HOSPITAL_COMMUNITY): Payer: 59

## 2021-02-04 ENCOUNTER — Emergency Department (HOSPITAL_COMMUNITY)
Admission: EM | Admit: 2021-02-04 | Discharge: 2021-02-04 | Disposition: A | Discharge status: Home / Self Care | Payer: 59 | Attending: Emergency Medicine | Admitting: Emergency Medicine

## 2021-02-04 DIAGNOSIS — Z8616 Personal history of COVID-19: Secondary | ICD-10-CM | POA: Diagnosis not present

## 2021-02-04 DIAGNOSIS — S39012A Strain of muscle, fascia and tendon of lower back, initial encounter: Secondary | ICD-10-CM | POA: Diagnosis not present

## 2021-02-04 DIAGNOSIS — I10 Essential (primary) hypertension: Secondary | ICD-10-CM | POA: Insufficient documentation

## 2021-02-04 DIAGNOSIS — R0789 Other chest pain: Secondary | ICD-10-CM | POA: Diagnosis not present

## 2021-02-04 DIAGNOSIS — J45909 Unspecified asthma, uncomplicated: Secondary | ICD-10-CM | POA: Insufficient documentation

## 2021-02-04 DIAGNOSIS — Y92410 Unspecified street and highway as the place of occurrence of the external cause: Secondary | ICD-10-CM | POA: Diagnosis not present

## 2021-02-04 DIAGNOSIS — Z79899 Other long term (current) drug therapy: Secondary | ICD-10-CM | POA: Diagnosis not present

## 2021-02-04 DIAGNOSIS — M25561 Pain in right knee: Secondary | ICD-10-CM | POA: Diagnosis not present

## 2021-02-04 DIAGNOSIS — M542 Cervicalgia: Secondary | ICD-10-CM | POA: Insufficient documentation

## 2021-02-04 DIAGNOSIS — S3992XA Unspecified injury of lower back, initial encounter: Secondary | ICD-10-CM | POA: Diagnosis present

## 2021-02-04 DIAGNOSIS — Z7951 Long term (current) use of inhaled steroids: Secondary | ICD-10-CM | POA: Diagnosis not present

## 2021-02-04 MED ORDER — IBUPROFEN 600 MG PO TABS: 600.0000 mg | ORAL_TABLET | Freq: Four times a day (QID) | ORAL | 0 refills | Status: DC | PRN

## 2021-02-04 MED ORDER — KETOROLAC TROMETHAMINE 60 MG/2ML IM SOLN
30.0000 mg | Freq: Once | INTRAMUSCULAR | Status: AC
  Administered 2021-02-04: 11:00:00 30 mg via INTRAMUSCULAR
  Filled 2021-02-04: qty 2

## 2021-02-04 MED ORDER — METHOCARBAMOL 500 MG PO TABS: 500.0000 mg | ORAL_TABLET | Freq: Two times a day (BID) | ORAL | 0 refills | Status: DC

## 2021-02-04 NOTE — ED Notes (Signed)
Pt verbalized understanding of d/c instructions, meds and followup care. Denies questions. VSS, no distress noted. W/C to exit with all belongings.  
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
## 2021-02-06 ENCOUNTER — Ambulatory Visit: Admission: EM | Admit: 2021-02-06 | Discharge: 2021-02-06 | Discharge status: Absent without leave | Payer: 59

## 2021-02-07 ENCOUNTER — Ambulatory Visit (INDEPENDENT_AMBULATORY_CARE_PROVIDER_SITE_OTHER): Payer: 59

## 2021-02-07 ENCOUNTER — Other Ambulatory Visit: Payer: Self-pay

## 2021-02-07 ENCOUNTER — Ambulatory Visit
Admission: EM | Admit: 2021-02-07 | Discharge: 2021-02-07 | Disposition: A | Discharge status: Home / Self Care | Payer: 59 | Attending: Urgent Care | Admitting: Urgent Care

## 2021-02-07 DIAGNOSIS — M5136 Other intervertebral disc degeneration, lumbar region: Secondary | ICD-10-CM

## 2021-02-07 DIAGNOSIS — M545 Low back pain, unspecified: Secondary | ICD-10-CM | POA: Diagnosis not present

## 2021-02-07 DIAGNOSIS — M48061 Spinal stenosis, lumbar region without neurogenic claudication: Secondary | ICD-10-CM | POA: Diagnosis not present

## 2021-02-07 DIAGNOSIS — I10 Essential (primary) hypertension: Secondary | ICD-10-CM

## 2021-02-07 DIAGNOSIS — M5126 Other intervertebral disc displacement, lumbar region: Secondary | ICD-10-CM

## 2021-02-07 MED ORDER — TIZANIDINE HCL 4 MG PO TABS: 4.0000 mg | ORAL_TABLET | Freq: Every day | ORAL | 0 refills | Status: DC

## 2021-02-07 MED ORDER — PREDNISONE 50 MG PO TABS: 50.0000 mg | ORAL_TABLET | Freq: Every day | ORAL | 0 refills | Status: DC

## 2021-02-07 NOTE — ED Triage Notes (Signed)
Pt states pain radiates into right buttock area and states that no imaging was done of back
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
## 2021-02-08 ENCOUNTER — Telehealth: Payer: Self-pay | Admitting: Family Medicine
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
## 2021-02-15 IMAGING — MR MR LUMBAR SPINE W/O CM
4 of 5 series · 26 of 48 positions shown · non-contrast
Comparison: MRI lumbar spine 06/22/2007

CLINICAL DATA: Low back pain with left leg pain and weakness and
numbness.

EXAM:
MRI LUMBAR SPINE WITHOUT CONTRAST
TECHNIQUE: Multiplanar, multisequence MR imaging of the lumbar spine was
performed. No intravenous contrast was administered.

[Series 3: T2 · sagittal · 4.0mm · 0.53mm/px · 6 of 19 slices shown (1 of 2)]
[im 1/19]
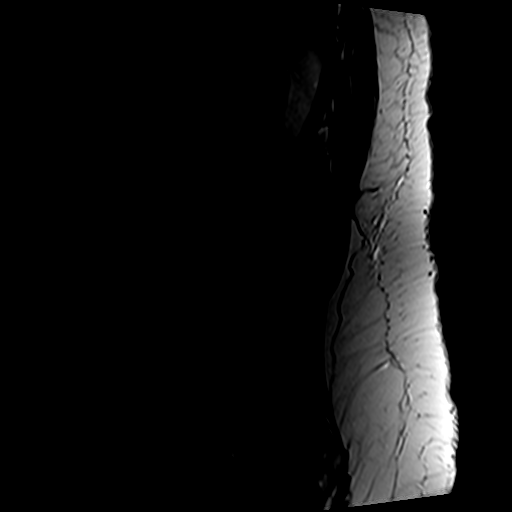
[im 4/19]
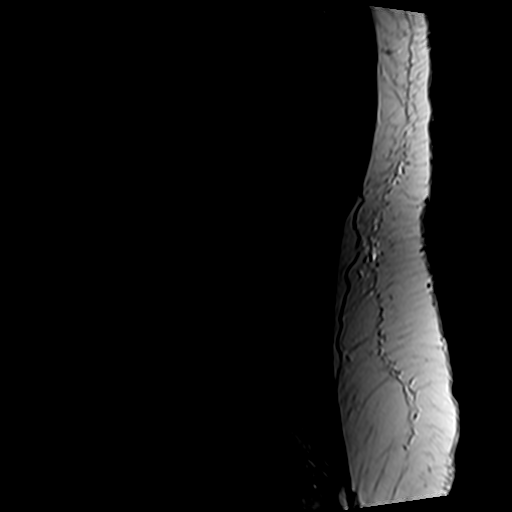
[im 8/19]
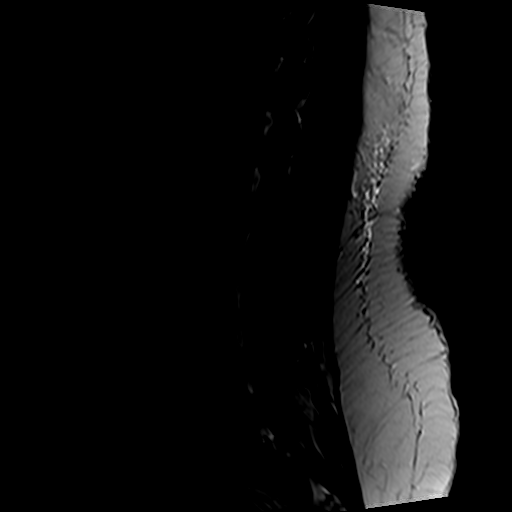
[im 11/19]
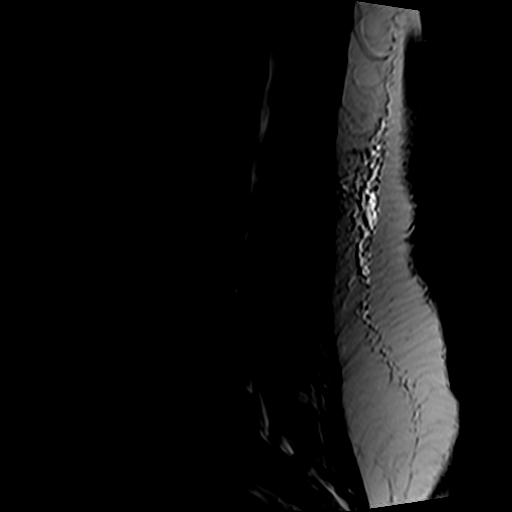
[im 15/19]
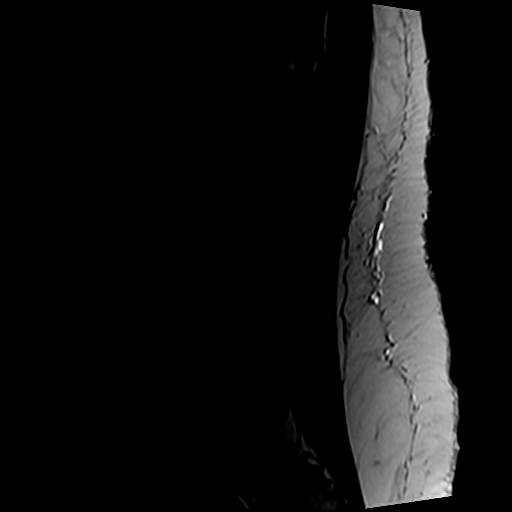
[im 19/19]
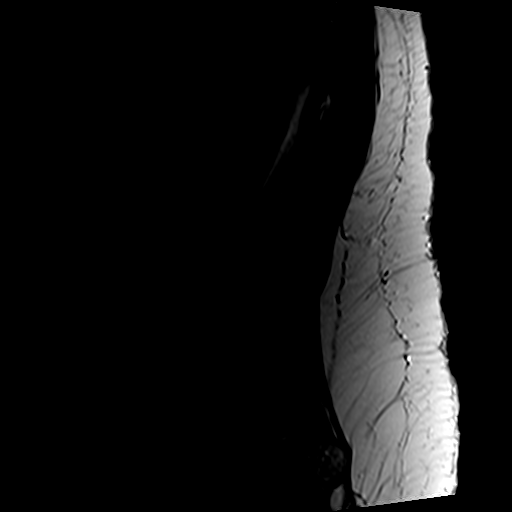

[Series 5: T1 · sagittal · 4.0mm · 0.53mm/px · 6 of 19 slices shown (1 of 2)]
[im 1/19]
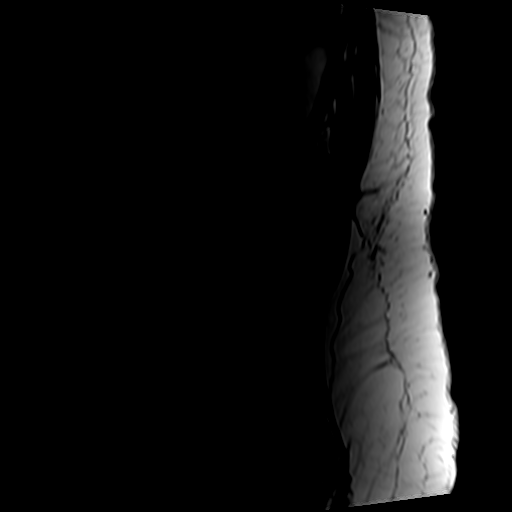
[im 4/19]
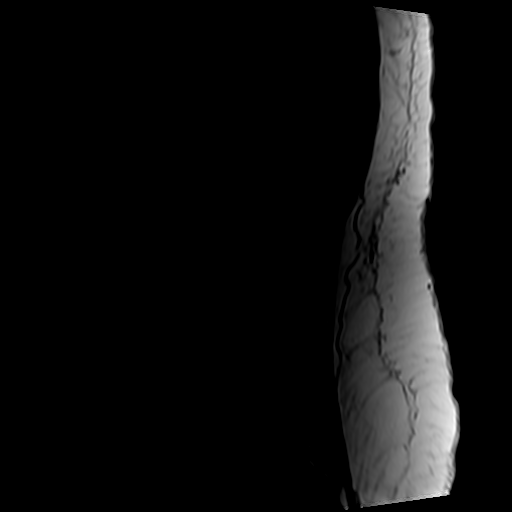
[im 8/19]
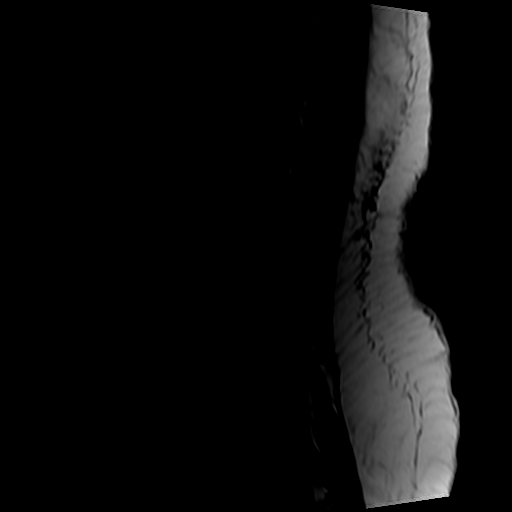
[im 11/19]
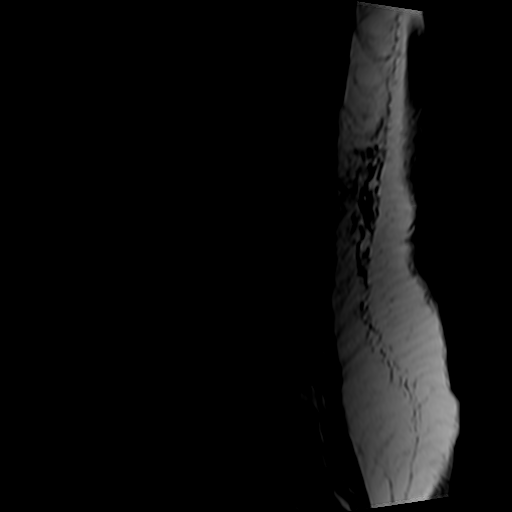
[im 15/19]
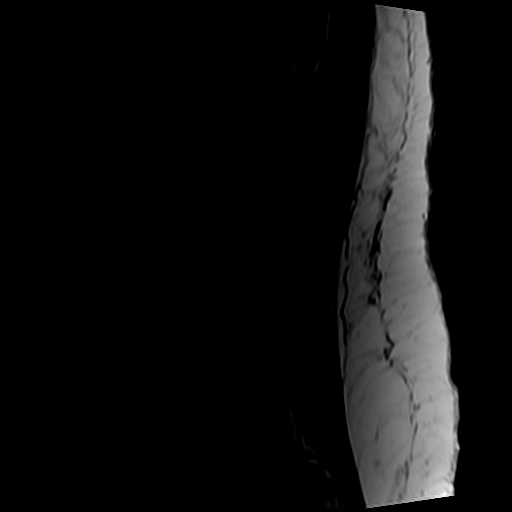
[im 19/19]
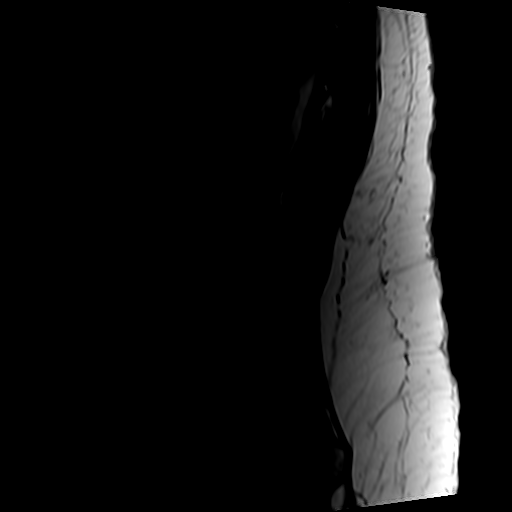

[Series 6: T2 · axial · 4.0mm · 0.70mm/px · z∈[-96,+141]mm · 9 of 45 slices shown (2 of 2)]
[im 1/45]
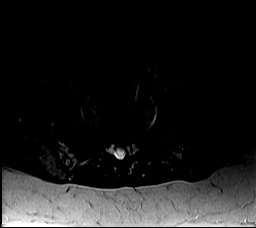
[im 7/45]
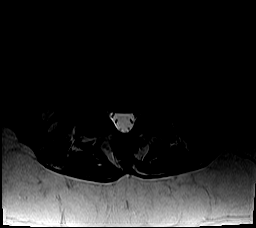
[im 13/45]
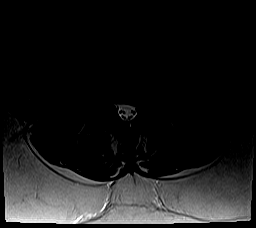
[im 19/45]
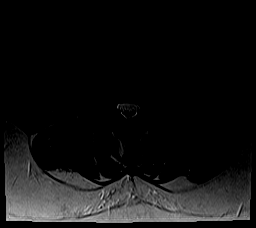
[im 23/45]
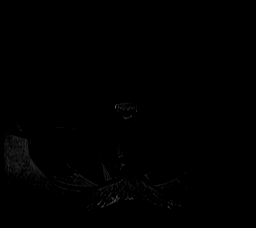
[im 26/45]
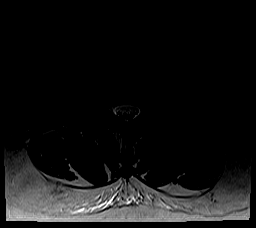
[im 32/45]
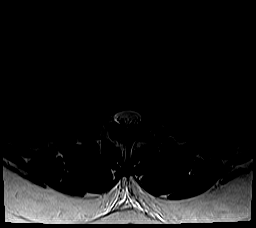
[im 38/45]
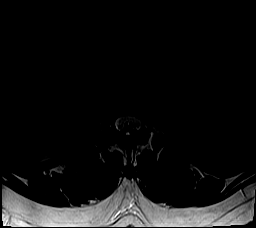
[im 45/45]
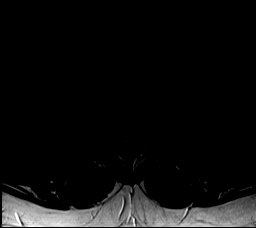

[Series 7: T1 · axial · 4.0mm · 0.35mm/px · z∈[-96,+105]mm · 5 of 45 slices shown (2 of 2)]
[im 1/45]
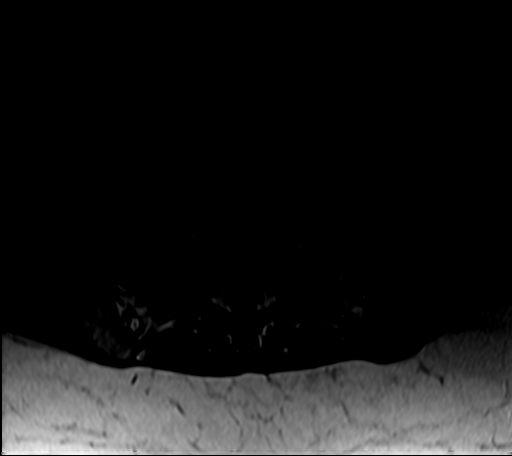
[im 7/45]
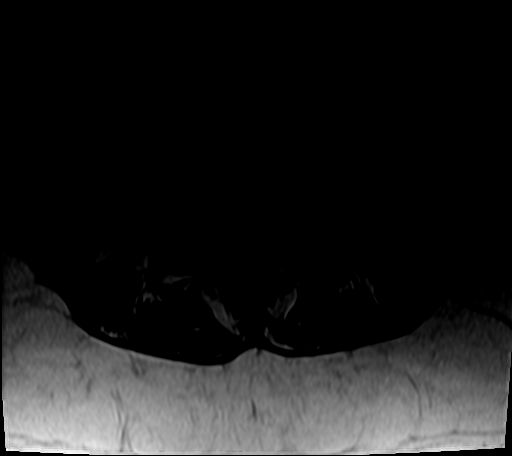
[im 13/45]
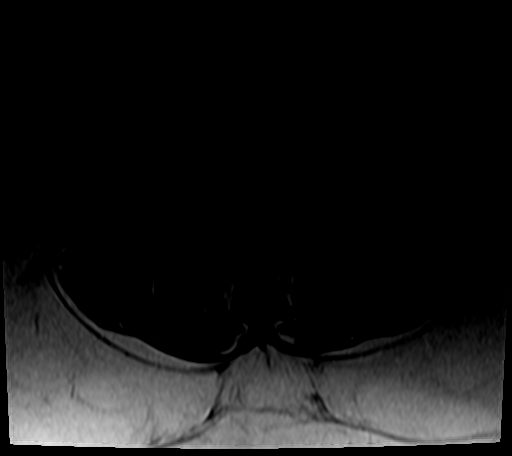
[im 23/45]
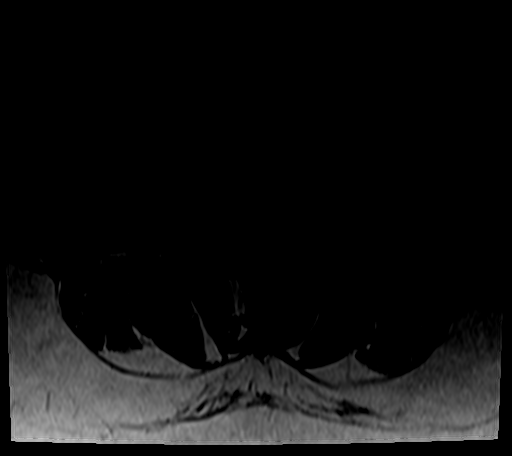
[im 38/45]
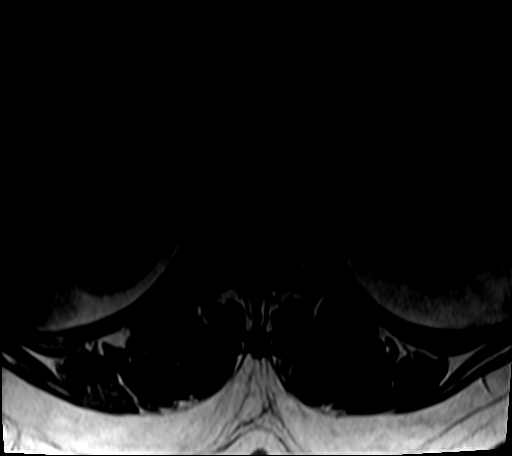

[26 of 48 positions shown; findings below may reference images not displayed]

FINDINGS: Segmentation:  Normal

Alignment:  Normal

Vertebrae:  Normal bone marrow.  Negative for fracture or mass.

Conus medullaris and cauda equina: Conus extends to the T12-L1
level. Conus and cauda equina appear normal.

Paraspinal and other soft tissues: Negative for paraspinous mass or
adenopathy.

Disc levels:

L1-2: Negative

L2-3: Negative

L3-4: Annular fissure left foramen with small associated disc
protrusion. Slight progression from the prior study. Possible left
L3 nerve root irritation. Mild facet degeneration.

L4-5: Mild disc degeneration and small central disc protrusion. Mild
facet degeneration. Mild left subarticular stenosis with mild
progression.

L5-S1: Progressive disc degeneration with disc space narrowing and
diffuse endplate spurring. Bilateral facet hypertrophy. Mild to
moderate foraminal stenosis bilaterally without significant interval
change.
IMPRESSION: 1. Left foraminal disc protrusion with progression from the prior
study. This is causing mild displacement left L3 nerve root in the
foramen.
2. Mild left subarticular stenosis L4-5 with mild progression
3. Mild to moderate foraminal stenosis bilaterally L5-S1 without
interval change.

## 2021-03-01 ENCOUNTER — Ambulatory Visit: Payer: 59

## 2021-03-09 ENCOUNTER — Ambulatory Visit: Payer: 59 | Admitting: Family Medicine

## 2021-03-09 ENCOUNTER — Encounter: Payer: Self-pay | Admitting: Family Medicine

## 2021-03-09 ENCOUNTER — Other Ambulatory Visit: Payer: Self-pay

## 2021-03-09 VITALS — BP 148/92 | HR 74 | Resp 16 | Ht 64.0 in | Wt 199.4 lb

## 2021-03-09 DIAGNOSIS — E559 Vitamin D deficiency, unspecified: Secondary | ICD-10-CM | POA: Diagnosis not present

## 2021-03-09 DIAGNOSIS — M5416 Radiculopathy, lumbar region: Secondary | ICD-10-CM

## 2021-03-09 DIAGNOSIS — J45991 Cough variant asthma: Secondary | ICD-10-CM

## 2021-03-09 DIAGNOSIS — Z6837 Body mass index (BMI) 37.0-37.9, adult: Secondary | ICD-10-CM

## 2021-03-09 DIAGNOSIS — G43001 Migraine without aura, not intractable, with status migrainosus: Secondary | ICD-10-CM

## 2021-03-09 DIAGNOSIS — I10 Essential (primary) hypertension: Secondary | ICD-10-CM | POA: Diagnosis not present

## 2021-03-09 DIAGNOSIS — E785 Hyperlipidemia, unspecified: Secondary | ICD-10-CM | POA: Diagnosis not present

## 2021-03-09 DIAGNOSIS — G8929 Other chronic pain: Secondary | ICD-10-CM

## 2021-03-09 DIAGNOSIS — M5442 Lumbago with sciatica, left side: Secondary | ICD-10-CM
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
## 2021-03-11 ENCOUNTER — Encounter: Payer: Self-pay | Admitting: Family Medicine

## 2021-03-11 NOTE — Assessment & Plan Note (Signed)
Hyperlipidemia:Low fat diet discussed and encouraged.   Lipid Panel  Lab Results  Component Value Date   CHOL 216 (H) 03/17/2020   HDL 72 03/17/2020   LDLCALC 131 (H) 03/17/2020   TRIG 73 03/17/2020   CHOLHDL 3.0 03/17/2020     Need to lower fat intake Updated lab needed at/ before next visit.

## 2021-03-11 NOTE — Assessment & Plan Note (Signed)
Controlled, no change in medication Managed by Pulmonary 

## 2021-03-11 NOTE — Assessment & Plan Note (Signed)
imporoved  Patient re-educated about  the importance of commitment to a  minimum of 150 minutes of exercise per week as able.  The importance of healthy food choices with portion control discussed, as well as eating regularly and within a 12 hour window most days. The need to choose "clean , green" food 50 to 75% of the time is discussed, as well as to make water the primary drink and set a goal of 64 ounces water daily.    Weight /BMI 03/09/2021 02/04/2021 01/09/2021  WEIGHT 199 lb 6.4 oz 194 lb 215 lb 2.6 oz  HEIGHT 5\' 4"  5\' 4"  5\' 4"   BMI 34.23 kg/m2 33.3 kg/m2 36.93 kg/m2

## 2021-03-11 NOTE — Assessment & Plan Note (Signed)
Unontrolled, pt wants to [facility] on med adjustment , I recommend this as it is consistently high DASH diet and commitment to daily physical activity for a minimum of 30 minutes discussed and encouraged, as a part of hypertension management. The importance of attaining a healthy weight is also discussed.  BP/Weight 03/09/2021 02/07/2021 02/04/2021 01/09/2021 12/14/2020 11/16/2020 06/07/6699  Systolic BP 100 349 611 643 539 122 583  Diastolic BP 92 89 90 91 88 85 83  Wt. (Lbs) 199.4 - 194 215.16 211.4 213 212  BMI 34.23 - 33.3 36.93 36.29 36.56 36.39

## 2021-03-11 NOTE — Assessment & Plan Note (Signed)
Controlled, no change in medication  
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
## 2021-03-28 ENCOUNTER — Ambulatory Visit (INDEPENDENT_AMBULATORY_CARE_PROVIDER_SITE_OTHER): Payer: 59 | Admitting: Internal Medicine

## 2021-03-28 ENCOUNTER — Encounter: Payer: Self-pay | Admitting: Internal Medicine

## 2021-03-28 ENCOUNTER — Other Ambulatory Visit: Payer: Self-pay

## 2021-03-28 DIAGNOSIS — J45991 Cough variant asthma: Secondary | ICD-10-CM | POA: Diagnosis not present

## 2021-03-28 NOTE — Patient Instructions (Signed)
Plan A = Automatic = Always=   Symbicort 1- 2 puffs first thing in am  and only use second 2 puffs if you are having breakthru symptom or need for albuterol.   Plan B = Backup (to supplement plan A, not to replace it) Only use your albuterol inhaler as a rescue medication to be used if you can't catch your breath by resting or doing a relaxed purse lip breathing pattern.  - The less you use it, the better it will work when you need it. - Ok to use the inhaler up to 2 puffs  every 4 hours if you must but call for appointment if use goes up over your usual need - Don't leave home without it !!  (think of it like the spare tire for your car)    .

## 2021-03-28 NOTE — Progress Notes (Signed)
Bridget Roberts, female    DOB: 1965/11/02,    MRN: [id number]   Brief patient profile:  66 yobf  never smoker but required inhalers intermittently  "as long as can remember " esp spring > fall  referred to pulmonary clinic in [place]  12/14/2020 by Dr Bari Mantis with new dx of persistent asthma requring symbicort 80 2 puffs daily ever since Covid in late August 2022  p receiving vax x 2    Had vc surgery wolicki's in her 22P seemed to help cough more than gerd rx but no f/u since then   History of Present Illness  12/14/2020  Pulmonary/ 1st office eval/ Melvyn Novas / [facility]  Chief Complaint  Patient presents with   Consult    After covid in 8/22 needs inhaler everyday. wheezing  Dyspnea: on her feet all day ok / no steps required Cough: no production but frequent throat clearing and saba use at ov induced coughing fits  Sleep: able to lie flat ok with couple of pillows s resp cc  SABA use: none  Rec Plan A = Automatic = Always=   Symbicort 80 Take 2 puffs first thing in am and then another 2 puffs about 12 hours later.   Work on inhaler technique:    Plan B = Backup (to supplement plan A, not to replace it) Only use your albuterol inhaler as a rescue medication Please remember to go to the lab department  Allergy screen 12/14/2020 >  Eos 0.2 /  IgE 149     03/28/2021  f/u ov/[place] office/Leyton Magoon re: cough variant asthma maint on symbicort 80  2 each am /  no singulair  Chief Complaint  Patient presents with   Follow-up    Breathing has improved. Feels her BP med has affected her breathing    Dyspnea:  operating machine  Cough: none  Sleeping: no resp cc  SABA use: none  02: none     No obvious day to day or daytime variability or assoc excess/ purulent sputum or mucus plugs or hemoptysis or cp or chest tightness, subjective wheeze or overt sinus or hb symptoms.   Sleeping  without nocturnal  or early am exacerbation  of respiratory  c/o's or need for noct saba. Also  denies any obvious fluctuation of symptoms with weather or environmental changes or other aggravating or alleviating factors except as outlined above   No unusual exposure hx or h/o childhood pna/or knowledge of premature birth.  Current Allergies, Complete Past Medical History, Past Surgical History, Family History, and Social History were reviewed in [employer] record.  ROS  The following are not active complaints unless bolded Hoarseness, sore throat, dysphagia, dental problems, itching, sneezing,  nasal congestion or discharge of excess mucus or purulent secretions, ear ache,   fever, chills, sweats, unintended wt loss or wt gain, classically pleuritic or exertional cp,  orthopnea pnd or arm/hand swelling  or leg swelling, presyncope, palpitations, abdominal pain, anorexia, nausea, vomiting, diarrhea  or change in bowel habits or change in bladder habits, change in stools or change in urine, dysuria, hematuria,  rash, arthralgias, visual complaints, headache, numbness, weakness or ataxia or problems with walking or coordination,  change in mood or  memory.        Current Meds  Medication Sig   albuterol (VENTOLIN HFA) 108 (90 Base) MCG/ACT inhaler TAKE 2 PUFFS BY MOUTH EVERY 6 HOURS AS NEEDED FOR WHEEZE OR SHORTNESS OF BREATH   amLODipine (  NORVASC) 10 MG tablet Take 1 tablet (10 mg total) by mouth daily.   budesonide-formoterol (SYMBICORT) 80-4.5 MCG/ACT inhaler Inhale 2 puffs into the lungs 2 (two) times daily.   cetirizine-pseudoephedrine (ZYRTEC-D) 5-120 MG tablet Take 1 tablet by mouth daily.   ibuprofen (ADVIL) 600 MG tablet Take 1 tablet (600 mg total) by mouth every 6 (six) hours as needed.   montelukast (SINGULAIR) 10 MG tablet TAKE 1 TABLET BY MOUTH EVERYDAY AT BEDTIME   SUMAtriptan (IMITREX) 100 MG tablet One tablet at headache onset May repeat in 2 hours if headache persists or recurs. Maximum  Use is twice weekly (Patient taking differently: One tablet at  headache onset May repeat in 2 hours if headache persists or recurs. Maximum  Use is twice weekly)           Objective:     Wt Readings from Last 3 Encounters:  03/28/21 202 lb (91.6 kg)  03/09/21 199 lb 6.4 oz (90.4 kg)  02/04/21 194 lb (88 kg)      Vital signs reviewed  03/28/2021  - Note at rest 02 sats  99% on RA   General appearance:    amb bf nad, easily confused with names of meds    HEENT : pt wearing mask not removed for exam due to covid -19 concerns.    NECK :  without JVD/Nodes/TM/ nl carotid upstrokes bilaterally   LUNGS: no acc muscle use,  Nl contour chest which is clear to A and P bilaterally without cough on insp or exp maneuvers   CV:  RRR  no s3 or murmur or increase in P2, and no edema   ABD:  soft and nontender with nl inspiratory excursion in the supine position. No bruits or organomegaly appreciated, bowel sounds nl  MS:  Nl gait/ ext warm without deformities, calf tenderness, cyanosis or clubbing No obvious joint restrictions   SKIN: warm and dry without lesions    NEURO:  alert, approp, nl sensorium with  no motor or cerebellar deficits apparent.        Assessment
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
## 2021-06-27 ENCOUNTER — Telehealth: Payer: Self-pay | Admitting: Family Medicine

## 2021-06-27 NOTE — Telephone Encounter (Signed)
FMLA FORMS  ? ?NOTED  ?COPIED ?SLEEVED  ? ? ?IN [place] FOLDER ?

## 2021-07-06 ENCOUNTER — Ambulatory Visit: Payer: 59 | Admitting: Family Medicine

## 2021-10-10 ENCOUNTER — Ambulatory Visit: Payer: 59 | Admitting: Internal Medicine

## 2021-10-10 NOTE — Progress Notes (Deleted)
Bridget Roberts, female    DOB: 1965/08/14    MRN: [id number]   Brief patient profile:  48 yobf  never smoker but required inhalers intermittently  "as long as can remember " esp spring > fall  referred to pulmonary clinic in [place]  12/14/2020 by Dr Bari Mantis with new dx of persistent asthma requring symbicort 80 2 puffs daily ever since Covid in late August 2022  p receiving vax x 2    Had vc surgery wolicki's in her 77K seemed to help cough more than gerd rx but no f/u since then   History of Present Illness  12/14/2020  Pulmonary/ 1st office eval/ Melvyn Novas / [facility]  Chief Complaint  Patient presents with   Consult    After covid in 8/22 needs inhaler everyday. wheezing  Dyspnea: on her feet all day ok / no steps required Cough: no production but frequent throat clearing and saba use at ov induced coughing fits  Sleep: able to lie flat ok with couple of pillows s resp cc  SABA use: none  Rec Plan A = Automatic = Always=   Symbicort 80 Take 2 puffs first thing in am and then another 2 puffs about 12 hours later.   Work on inhaler technique:    Plan B = Backup (to supplement plan A, not to replace it) Only use your albuterol inhaler as a rescue medication Please remember to go to the lab department  Allergy screen 12/14/2020 >  Eos 0.2 /  IgE 149     03/28/2021  f/u ov/[place] office/Sheelah Ritacco re: cough variant asthma maint on symbicort 80  2 each am /  no singulair  Chief Complaint  Patient presents with   Follow-up    Breathing has improved. Feels her BP med has affected her breathing   Dyspnea:  operating machine  Cough: none  Sleeping: no resp cc  SABA use: none  02: none   Rec Plan A = Automatic = Always=   Symbicort 1 - 2 puffs first thing in am  and only use second 2 puffs if you are having breakthru symptom or need for albuterol.  Plan B = Backup (to supplement plan A, not to replace it) Only use your albuterol inhaler as a rescue medication    10/10/2021   f/u ov/[place] office/Alireza Pollack re: *** maint on ***  No chief complaint on file.   Dyspnea:  *** Cough: *** Sleeping: *** SABA use: *** 02: *** Covid status: *** Lung cancer screening: ***   No obvious day to day or daytime variability or assoc excess/ purulent sputum or mucus plugs or hemoptysis or cp or chest tightness, subjective wheeze or overt sinus or hb symptoms.   *** without nocturnal  or early am exacerbation  of respiratory  c/o's or need for noct saba. Also denies any obvious fluctuation of symptoms with weather or environmental changes or other aggravating or alleviating factors except as outlined above   No unusual exposure hx or h/o childhood pna/ asthma or knowledge of premature birth.  Current Allergies, Complete Past Medical History, Past Surgical History, Family History, and Social History were reviewed in [employer] record.  ROS  The following are not active complaints unless bolded Hoarseness, sore throat, dysphagia, dental problems, itching, sneezing,  nasal congestion or discharge of excess mucus or purulent secretions, ear ache,   fever, chills, sweats, unintended wt loss or wt gain, classically pleuritic or exertional cp,  orthopnea pnd or arm/hand  swelling  or leg swelling, presyncope, palpitations, abdominal pain, anorexia, nausea, vomiting, diarrhea  or change in bowel habits or change in bladder habits, change in stools or change in urine, dysuria, hematuria,  rash, arthralgias, visual complaints, headache, numbness, weakness or ataxia or problems with walking or coordination,  change in mood or  memory.        No outpatient medications have been marked as taking for the 10/10/21 encounter (Appointment) with Tanda Rockers, MD.           Objective:    Wts   10/10/2021       ***  03/28/21 202 lb (91.6 kg)  03/09/21 199 lb 6.4 oz (90.4 kg)  02/04/21 194 lb (88 kg)    Vital signs reviewed  10/10/2021  - Note at rest 02 sats  ***% on  ***   General appearance:    ***         Assessment

## 2022-05-11 ENCOUNTER — Ambulatory Visit
Admission: EM | Admit: 2022-05-11 | Discharge: 2022-05-11 | Disposition: A | Discharge status: Home / Self Care | Payer: 59 | Attending: Nurse Practitioner | Admitting: Nurse Practitioner

## 2022-05-11 DIAGNOSIS — G43011 Migraine without aura, intractable, with status migrainosus: Secondary | ICD-10-CM

## 2022-05-11 MED ORDER — DEXAMETHASONE SODIUM PHOSPHATE 10 MG/ML IJ SOLN
10.0000 mg | INTRAMUSCULAR | Status: AC
  Administered 2022-05-11: 10 mg via INTRAMUSCULAR

## 2022-05-11 MED ORDER — ONDANSETRON 4 MG PO TBDP
4.0000 mg | ORAL_TABLET | Freq: Once | ORAL | Status: AC
  Administered 2022-05-11: 4 mg via ORAL

## 2022-05-11 MED ORDER — KETOROLAC TROMETHAMINE 60 MG/2ML IM SOLN
30.0000 mg | Freq: Once | INTRAMUSCULAR | Status: AC
  Administered 2022-05-11: 30 mg via INTRAMUSCULAR

## 2022-05-11 NOTE — ED Triage Notes (Signed)
Pt states that she has a migraine since Tuesday. Hx of migraines. Right ear is ringing. She's taking imitrex but says its making it worse.

## 2022-05-11 NOTE — Discharge Instructions (Signed)
You have been given Decadron 10 mg and Toradol 30 mg intramuscularly today along with Zofran 4 mg by mouth.  Do not take any additional ibuprofen today.  You can take Tylenol arthritis strength 650 mg tablets as needed. Increase fluids and allow for plenty of rest. Make sure you take your blood pressure medicine daily. Recommend a dimly lit room, and cool cloths against her forehead to help with headache pain or discomfort. Recommend the use of shades as needed to help with light sensitivity. Go to the emergency department immediately if you experience slurred speech, change in your mental status, weakness in your legs or feet, severe headache that is not improving with medications provided today, or other concerns. As discussed, if headaches become more frequent and more severe, please follow-up with your primary care physician to discuss possible referral to neurology. Follow-up as needed.

## 2022-05-11 NOTE — ED Triage Notes (Signed)
BP is high. She hasn't taken her amlodipine.
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
## 2022-06-04 ENCOUNTER — Ambulatory Visit: Payer: 59 | Admitting: Family Medicine

## 2022-06-06 ENCOUNTER — Encounter: Payer: Self-pay | Admitting: Family Medicine

## 2022-06-18 ENCOUNTER — Encounter: Payer: Self-pay | Admitting: Family Medicine

## 2022-06-18 ENCOUNTER — Ambulatory Visit (INDEPENDENT_AMBULATORY_CARE_PROVIDER_SITE_OTHER): Payer: 59 | Admitting: Family Medicine

## 2022-06-18 ENCOUNTER — Telehealth: Payer: Self-pay | Admitting: Family Medicine

## 2022-06-18 VITALS — BP 136/69 | HR 76 | Resp 16 | Ht 64.0 in | Wt 209.0 lb

## 2022-06-18 DIAGNOSIS — J45991 Cough variant asthma: Secondary | ICD-10-CM

## 2022-06-18 DIAGNOSIS — I10 Essential (primary) hypertension: Secondary | ICD-10-CM

## 2022-06-18 DIAGNOSIS — J387 Other diseases of larynx: Secondary | ICD-10-CM

## 2022-06-18 DIAGNOSIS — G43001 Migraine without aura, not intractable, with status migrainosus: Secondary | ICD-10-CM

## 2022-06-18 DIAGNOSIS — R490 Dysphonia: Secondary | ICD-10-CM

## 2022-06-18 DIAGNOSIS — E559 Vitamin D deficiency, unspecified: Secondary | ICD-10-CM

## 2022-06-18 DIAGNOSIS — Z1211 Encounter for screening for malignant neoplasm of colon: Secondary | ICD-10-CM | POA: Diagnosis not present

## 2022-06-18 DIAGNOSIS — E785 Hyperlipidemia, unspecified: Secondary | ICD-10-CM

## 2022-06-18 DIAGNOSIS — H9202 Otalgia, left ear: Secondary | ICD-10-CM

## 2022-06-18 DIAGNOSIS — Z1231 Encounter for screening mammogram for malignant neoplasm of breast: Secondary | ICD-10-CM

## 2022-06-18 NOTE — Telephone Encounter (Signed)
3 sets of forms just like these  FMLA - blood pressure / headaches/ sciatica nerve and asthma   Copied Noted Sleeved  Call patient when ready , fax to [employer] [phone]

## 2022-06-18 NOTE — Telephone Encounter (Signed)
FMLA forms - migrane headaches urgent care seen  Copied Noted Sleeved  Call patient when ready and fax to [employer] [phone]

## 2022-06-18 NOTE — Patient Instructions (Addendum)
Annual exam in 4 months, call if you need me sooner  Please schedule mammogram at checkout  You are referred for colonoscopy and to ENT  Fasting CBC, lipid, cmp and EGFr, TSH and vit D as soon as possible  It is important that you exercise regularly at least 30 minutes 5 times a week. If you develop chest pain, have severe difficulty breathing, or feel very tired, stop exercising immediately and seek medical attention   Think about what you will eat, plan ahead. Choose " clean, green, fresh or frozen" over canned, processed or packaged foods which are more sugary, salty and fatty. 70 to 75% of food eaten should be vegetables and fruit. Three meals at set times with snacks allowed between meals, but they must be fruit or vegetables. Aim to eat over a 12 hour period , example 7 am to 7 pm, and STOP after  your last meal of the day. Drink water,generally about 64 ounces per day, no other drink is as healthy. Fruit juice is best enjoyed in a healthy way, by EATING the fruit.

## 2022-06-19 ENCOUNTER — Encounter: Payer: Self-pay | Admitting: *Deleted

## 2022-06-19 ENCOUNTER — Encounter: Payer: Self-pay | Admitting: Family Medicine

## 2022-06-19 DIAGNOSIS — J387 Other diseases of larynx: Secondary | ICD-10-CM | POA: Insufficient documentation

## 2022-06-19 MED ORDER — SUMATRIPTAN SUCCINATE 100 MG PO TABS
ORAL_TABLET | ORAL | 2 refills | Status: DC
Start: 2022-06-19 — End: 2023-09-17

## 2022-06-19 NOTE — Assessment & Plan Note (Signed)
Reports not using maintenance inhaler and no report of asthma flare needing OV, UC or ED visit on record

## 2022-06-19 NOTE — Assessment & Plan Note (Signed)
Recent UC visit with 4 day history in 04/2022, reported frequency of every 6 months approximately, immitrex renewed, FMLA paperwork to be completed

## 2022-06-19 NOTE — Assessment & Plan Note (Signed)
Hyperlipidemia:Low fat diet discussed and encouraged.   Lipid Panel  Lab Results  Component Value Date   CHOL 216 (H) 03/17/2020   HDL 72 03/17/2020   LDLCALC 131 (H) 03/17/2020   TRIG 73 03/17/2020   CHOLHDL 3.0 03/17/2020     Updated lab needed at/ before next visit.  

## 2022-06-19 NOTE — Assessment & Plan Note (Signed)
H/o biopsy of laryngeal nodule reporting 4 month h/o increasing painless hoarseness , wit left  otalgia and neck discomfort refer ENT

## 2022-06-19 NOTE — Progress Notes (Signed)
   Bridget Roberts     MRN: [id number]      DOB: 10-04-1965  Chief Complaint  Patient presents with   Headache    Went to urgent care 3/30 for headache that started 3/26-29. The next week she got sick and lost her voice for 3 days and still has raspiness in her voice. Can still feel something in left side of neck and ear, not painful now    HPI As above, also Bridget Roberts is here for follow up and re-evaluation of chronic medical conditions, medication management and review of any available recent lab and radiology data.  Preventive health is updated, specifically  Cancer screening and Immunization.   Recent UC visit is reviewed, she recovered well from headache after anti inflammatory injection C/o weight gain, wants no medication , will work on this RHM needs updating Has FMLA paperwork to be completed   ROS Denies recent fever or chills. Denies sinus pressure, nasal congestion, ear pain or sore throat. Denies chest congestion, productive cough or wheezing. Denies chest pains, palpitations and leg swelling Denies abdominal pain, nausea, vomiting,diarrhea or constipation.   Denies dysuria, frequency, hesitancy or incontinence. 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 Denies depression, anxiety or insomnia. Denies skin break down or rash.   PE  BP 136/69   Pulse 76   Resp 16   Ht 5\' 4"  (1.626 m)   Wt 209 lb (94.8 kg)   LMP 01/29/2013   SpO2 96%   BMI 35.87 kg/m   Patient alert and oriented and in no cardiopulmonary distress.  HEENT: No facial asymmetry, EOMI,     Neck supple .No adenopathy  Chest: Clear to auscultation bilaterally.  CVS: S1, S2 no murmurs, no S3.Regular rate.   Ext: No edema  MS: Adequate ROM spine, shoulders, hips and knees.  Skin: Intact, no ulcerations or  rash noted.  Psych: Good eye contact, normal affect. Memory intact not anxious or depressed appearing.  CNS: CN 2-12 intact, power,  normal throughout.no focal deficits noted.   Assessment & Plan  No problem-specific Assessment & Plan notes found for this encounter.

## 2022-06-19 NOTE — Assessment & Plan Note (Signed)
  Patient re-educated about  the importance of commitment to a  minimum of 150 minutes of exercise per week as able.  The importance of healthy food choices with portion control discussed, as well as eating regularly and within a 12 hour window most days. The need to choose "clean , green" food 50 to 75% of the time is discussed, as well as to make water the primary drink and set a goal of 64 ounces water daily.       06/18/2022    3:42 PM 05/11/2022    2:36 PM 03/28/2021    1:38 PM  Weight /BMI  Weight 209 lb 182 lb 202 lb  Height 5\' 4"  (1.626 m)  5\' 4"  (1.626 m)  BMI 35.87 kg/m2 31.24 kg/m2 34.67 kg/m2

## 2022-06-19 NOTE — Assessment & Plan Note (Signed)
Controlled, no change in medication DASH diet and commitment to daily physical activity for a minimum of 30 minutes discussed and encouraged, as a part of hypertension management. The importance of attaining a healthy weight is also discussed.     06/18/2022    3:42 PM 05/11/2022    3:05 PM 05/11/2022    2:38 PM 05/11/2022    2:36 PM 03/28/2021    1:38 PM 03/09/2021    3:25 PM 03/09/2021    3:04 PM  BP/Weight  Systolic BP 136 160 158  128 148 152  Diastolic BP 69 103 105  74 92 92  Wt. (Lbs) 209   182 202  199.4  BMI 35.87 kg/m2   31.24 kg/m2 34.67 kg/m2  34.23 kg/m2

## 2022-06-19 NOTE — Assessment & Plan Note (Signed)
Updated lab needed at/ before next visit.   

## 2022-06-21 DIAGNOSIS — Z0279 Encounter for issue of other medical certificate: Secondary | ICD-10-CM

## 2022-06-25 ENCOUNTER — Telehealth: Payer: Self-pay

## 2022-06-25 NOTE — Telephone Encounter (Signed)
Lvm forms ready for pick up

## 2022-06-25 NOTE — Telephone Encounter (Signed)
Provider completed 4 FMLA applications for patient. Ready for pickup.  Copies made and dropped in scan box  Faxed to [phone]  Charges dropped for each form

## 2022-07-04 ENCOUNTER — Encounter: Payer: Self-pay | Admitting: Family Medicine

## 2022-07-05 LAB — CBC
Hematocrit: 37.7 % (ref 34.0–46.6)
Hemoglobin: 12.9 g/dL (ref 11.1–15.9)
MCH: 28.9 pg (ref 26.6–33.0)
MCHC: 34.2 g/dL (ref 31.5–35.7)
MCV: 84 fL (ref 79–97)
Platelets: 317 10*3/uL (ref 150–450)
RBC: 4.47 x10E6/uL (ref 3.77–5.28)
RDW: 13 % (ref 11.7–15.4)
WBC: 8.9 10*3/uL (ref 3.4–10.8)

## 2022-07-05 LAB — CMP14+EGFR
ALT: 11 IU/L (ref 0–32)
AST: 15 IU/L (ref 0–40)
Albumin/Globulin Ratio: 1.5 (ref 1.2–2.2)
Albumin: 4 g/dL (ref 3.8–4.9)
Alkaline Phosphatase: 80 IU/L (ref 44–121)
BUN/Creatinine Ratio: 9 (ref 9–23)
BUN: 7 mg/dL (ref 6–24)
Bilirubin Total: 0.3 mg/dL (ref 0.0–1.2)
CO2: 20 mmol/L (ref 20–29)
Calcium: 9.5 mg/dL (ref 8.7–10.2)
Chloride: 104 mmol/L (ref 96–106)
Creatinine, Ser: 0.74 mg/dL (ref 0.57–1.00)
Globulin, Total: 2.7 g/dL (ref 1.5–4.5)
Glucose: 85 mg/dL (ref 70–99)
Potassium: 4 mmol/L (ref 3.5–5.2)
Sodium: 139 mmol/L (ref 134–144)
Total Protein: 6.7 g/dL (ref 6.0–8.5)
eGFR: 94 mL/min/{1.73_m2} (ref >59)

## 2022-07-05 LAB — LIPID PANEL
Chol/HDL Ratio: 2.7 ratio (ref 0.0–4.4)
Cholesterol, Total: 191 mg/dL (ref 100–199)
HDL: 71 mg/dL (ref >39)
LDL Chol Calc (NIH): 109 mg/dL — ABNORMAL HIGH (ref 0–99)
Triglycerides: 60 mg/dL (ref 0–149)
VLDL Cholesterol Cal: 11 mg/dL (ref 5–40)

## 2022-07-05 LAB — TSH: TSH: 1.39 u[IU]/mL (ref 0.450–4.500)

## 2022-07-05 LAB — VITAMIN D 25 HYDROXY (VIT D DEFICIENCY, FRACTURES): Vit D, 25-Hydroxy: 21.1 ng/mL — ABNORMAL LOW (ref 30.0–100.0)

## 2022-07-05 MED ORDER — VITAMIN D (ERGOCALCIFEROL) 1.25 MG (50000 UNIT) PO CAPS: 50000.0000 [IU] | ORAL_CAPSULE | ORAL | 8 refills | Status: DC

## 2022-07-05 NOTE — Addendum Note (Signed)
Addended by: Kerri Perches on: 07/05/2022 07:50 AM   Modules accepted: Orders

## 2022-08-09 ENCOUNTER — Ambulatory Visit (HOSPITAL_COMMUNITY): Payer: 59

## 2022-08-12 ENCOUNTER — Ambulatory Visit (HOSPITAL_COMMUNITY): Payer: 59

## 2022-10-23 ENCOUNTER — Encounter: Payer: 59 | Admitting: Family Medicine

## 2022-11-13 ENCOUNTER — Other Ambulatory Visit: Payer: Self-pay | Admitting: Family Medicine

## 2022-11-15 ENCOUNTER — Other Ambulatory Visit: Payer: Self-pay | Admitting: Family Medicine

## 2022-11-15 DIAGNOSIS — Z1212 Encounter for screening for malignant neoplasm of rectum: Secondary | ICD-10-CM

## 2022-11-15 DIAGNOSIS — Z1211 Encounter for screening for malignant neoplasm of colon: Secondary | ICD-10-CM

## 2022-12-23 ENCOUNTER — Encounter: Payer: Self-pay | Admitting: *Deleted

## 2023-01-01 ENCOUNTER — Encounter: Payer: Self-pay | Admitting: Family Medicine

## 2023-01-01 ENCOUNTER — Ambulatory Visit (INDEPENDENT_AMBULATORY_CARE_PROVIDER_SITE_OTHER): Payer: 59 | Admitting: Family Medicine

## 2023-01-01 VITALS — BP 160/100 | HR 69 | Ht 64.0 in | Wt 212.1 lb

## 2023-01-01 DIAGNOSIS — Z1231 Encounter for screening mammogram for malignant neoplasm of breast: Secondary | ICD-10-CM

## 2023-01-01 DIAGNOSIS — I10 Essential (primary) hypertension: Secondary | ICD-10-CM

## 2023-01-01 DIAGNOSIS — G43001 Migraine without aura, not intractable, with status migrainosus: Secondary | ICD-10-CM

## 2023-01-01 DIAGNOSIS — E785 Hyperlipidemia, unspecified: Secondary | ICD-10-CM | POA: Diagnosis not present

## 2023-01-01 DIAGNOSIS — Z0289 Encounter for other administrative examinations: Secondary | ICD-10-CM

## 2023-01-01 DIAGNOSIS — M5410 Radiculopathy, site unspecified: Secondary | ICD-10-CM

## 2023-01-01 DIAGNOSIS — E559 Vitamin D deficiency, unspecified: Secondary | ICD-10-CM

## 2023-01-01 DIAGNOSIS — Z1211 Encounter for screening for malignant neoplasm of colon: Secondary | ICD-10-CM

## 2023-01-01 MED ORDER — TIRZEPATIDE-WEIGHT MANAGEMENT 2.5 MG/0.5ML ~~LOC~~ SOAJ: 2.5000 mg | SUBCUTANEOUS | 0 refills | Status: DC

## 2023-01-01 NOTE — Progress Notes (Signed)
Bridget Roberts     MRN: [id number]      DOB: [birth year]  Chief Complaint  Patient presents with   Follow-up    Follow up    HPI Bridget Roberts is here for follow up and re-evaluation of chronic medical conditions, medication management and review of any available recent lab and radiology data.  Preventive health is updated, specifically  Cancer screening and Immunization.   Questions or concerns regarding consultations or procedures which the PT has had in the interim are  addressed. 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  Denies recent fever or chills. Denies sinus pressure, nasal congestion, ear pain or sore throat. Denies chest congestion, productive cough or wheezing. Denies chest pains, palpitations and leg swelling Denies abdominal pain, nausea, vomiting,diarrhea or constipation.   Denies dysuria, frequency, hesitancy or incontinence. . Denies depression, anxiety or insomnia. Denies skin break down or rash.   PE  BP (!) 160/100   Pulse 69   Ht 5\' 4"  (1.626 m)   Wt 212 lb 1.3 oz (96.2 kg)   LMP 01/29/2013   SpO2 98%   BMI 36.40 kg/m   Patient alert and oriented HEENT: No facial asymmetry, EOMI,     Neck supple .  Chest: Clear to auscultation bilaterally.  CVS: S1, S2 no murmurs, no S3.Regular rate.  ABD: Soft non tender.   Ext: No edema  MS: decreased  ROM lumbar spine, adequate inn shoulders, hips and knees.  Skin: Intact, no ulcerations or rash noted.  Psych: Good eye contact, normal affect. Memory intact not anxious or depressed appearing.  CNS: CN 2-12 intact, power,   normal throughout.no focal deficits noted.   Assessment & Plan  Essential hypertension Uncontrolled, pt resists increased medication and will monitor at home, will call in if convinced she needs additional medication as proposed DASH diet and commitment to daily physical activity for a minimum of 30 minutes discussed and encouraged, as a part of hypertension management. The importance of attaining a healthy weight is also discussed.     01/01/2023    1:53 PM 01/01/2023    1:09 PM 01/01/2023    1:08 PM 06/18/2022    3:42 PM 05/11/2022    3:05 PM 05/11/2022    2:38 PM 05/11/2022    2:36 PM  BP/Weight  Systolic BP 160 165 174 136 160 158   Diastolic BP 100 96 102 69 103 086   Wt. (Lbs)   212.08 209   182  BMI   36.4 kg/m2 35.87 kg/m2   31.24 kg/m2       Morbid obesity (HCC)  Patient re-educated about  the importance of commitment to a  minimum of 150 minutes of exercise per week as able.  The importance of healthy food choices with portion control discussed, as well as eating regularly and within a 12 hour window most days. The need to choose "clean , green" food 50 to 75% of the time is discussed, as well as to make water the primary drink and  set a goal of 64 ounces water daily.       01/01/2023    1:08 PM 06/18/2022    3:42 PM 05/11/2022    2:36 PM  Weight /BMI  Weight 212 lb 1.3 oz 209 lb 182 lb  Height 5\' 4"  (1.626 m) 5\' 4"  (1.626 m)   BMI 36.4 kg/m2 35.87 kg/m2 31.24 kg/m2      Vitamin D deficiency Updated lab needed at/ before next visit.   Back pain with left-sided radiculopathy Recurrent flares following long work weeks FMLA forms completed   Hyperlipidemia LDL goal <100 Hyperlipidemia:Low fat diet discussed and encouraged. Not at goal, neds to lower fat intake  Lipid Panel  Lab Results  Component Value Date   CHOL 191 07/04/2022   HDL 71 07/04/2022   LDLCALC 109 (H) 07/04/2022   TRIG 60 07/04/2022   CHOLHDL 2.7 07/04/2022       Migraine  headache Fairly well controlled currently, has had to be out of work 3 times in 6 months due to headache, form completed  Encounter for completion of form with patient Forms needed for work to cover flare of sciatica, migraines and asthma are completed , also her chronic hypertension, which is uncontrolled currently

## 2023-01-01 NOTE — Patient Instructions (Addendum)
F/U re evaluate blood pressure and weight  in 8 to 9 weeks, call if you need me sooner  Please schedule mammogram at checkout  I will message admin re situation earlier today  You are referred for colonoscopy  Please reconsider flu vaccine, you also need the shingrix vaccines     Fasting lipid, chem 7 and EGFr, hBA1C and Vit D 3 to 7 days before visit  I am concerned blood pressure is consistently high , please bring cuff to next  vist  Please record BP at home atleast 3 days per week  If consistently greater than or equal to 145/90, if you send a message I will add the additional medication which I would like to start today, olmesartan 20 mg daily  No New medication for weight loss at this time  It is important that you exercise regularly at least 30 minutes 5 times a week. If you develop chest pain, have severe difficulty breathing, or feel very tired, stop exercising immediately and seek medical attention   Thanks for choosing [facility], we consider it a privelige to serve you.    FMLA will be copied from previous one

## 2023-01-02 ENCOUNTER — Encounter (INDEPENDENT_AMBULATORY_CARE_PROVIDER_SITE_OTHER): Payer: Self-pay | Admitting: *Deleted

## 2023-01-06 ENCOUNTER — Ambulatory Visit (HOSPITAL_COMMUNITY): Payer: 59

## 2023-01-07 ENCOUNTER — Telehealth: Payer: Self-pay | Admitting: Family Medicine

## 2023-01-07 NOTE — Telephone Encounter (Signed)
Called patient left voicemail fmla forms ready for pick up. Forms in front folder.

## 2023-01-17 ENCOUNTER — Encounter: Payer: Self-pay | Admitting: Family Medicine

## 2023-01-17 NOTE — Assessment & Plan Note (Signed)
  Patient re-educated about  the importance of commitment to a  minimum of 150 minutes of exercise per week as able.  The importance of healthy food choices with portion control discussed, as well as eating regularly and within a 12 hour window most days. The need to choose "clean , green" food 50 to 75% of the time is discussed, as well as to make water the primary drink and set a goal of 64 ounces water daily.       01/01/2023    1:08 PM 06/18/2022    3:42 PM 05/11/2022    2:36 PM  Weight /BMI  Weight 212 lb 1.3 oz 209 lb 182 lb  Height 5\' 4"  (1.626 m) 5\' 4"  (1.626 m)   BMI 36.4 kg/m2 35.87 kg/m2 31.24 kg/m2

## 2023-01-17 NOTE — Assessment & Plan Note (Signed)
Forms needed for work to cover flare of sciatica, migraines and asthma are completed , also her chronic hypertension, which is uncontrolled currently

## 2023-01-17 NOTE — Assessment & Plan Note (Signed)
Fairly well controlled currently, has had to be out of work 3 times in 6 months due to headache, form completed

## 2023-01-17 NOTE — Assessment & Plan Note (Signed)
Uncontrolled, pt resists increased medication and will monitor at home, will call in if convinced she needs additional medication as proposed DASH diet and commitment to daily physical activity for a minimum of 30 minutes discussed and encouraged, as a part of hypertension management. The importance of attaining a healthy weight is also discussed.     01/01/2023    1:53 PM 01/01/2023    1:09 PM 01/01/2023    1:08 PM 06/18/2022    3:42 PM 05/11/2022    3:05 PM 05/11/2022    2:38 PM 05/11/2022    2:36 PM  BP/Weight  Systolic BP 160 165 174 136 160 158   Diastolic BP 100 96 102 69 103 409   Wt. (Lbs)   212.08 209   182  BMI   36.4 kg/m2 35.87 kg/m2   31.24 kg/m2

## 2023-01-17 NOTE — Assessment & Plan Note (Signed)
Updated lab needed at/ before next visit.   

## 2023-01-17 NOTE — Assessment & Plan Note (Signed)
Recurrent flares following long work weeks FMLA forms completed

## 2023-01-17 NOTE — Assessment & Plan Note (Signed)
Hyperlipidemia:Low fat diet discussed and encouraged. Not at goal, neds to lower fat intake  Lipid Panel  Lab Results  Component Value Date   CHOL 191 07/04/2022   HDL 71 07/04/2022   LDLCALC 109 (H) 07/04/2022   TRIG 60 07/04/2022   CHOLHDL 2.7 07/04/2022

## 2023-02-25 ENCOUNTER — Ambulatory Visit: Admitting: Family Medicine

## 2023-02-28 NOTE — Telephone Encounter (Signed)
Spoke to patient faxed forms to [phone] [employer] for the patient.

## 2023-03-06 ENCOUNTER — Ambulatory Visit: Payer: 59 | Admitting: Family Medicine

## 2023-04-10 ENCOUNTER — Ambulatory Visit: Payer: 59 | Admitting: Family Medicine

## 2023-05-14 ENCOUNTER — Ambulatory Visit (INDEPENDENT_AMBULATORY_CARE_PROVIDER_SITE_OTHER): Payer: 59 | Admitting: Family Medicine

## 2023-05-14 ENCOUNTER — Encounter: Payer: Self-pay | Admitting: Family Medicine

## 2023-05-14 VITALS — BP 160/96 | HR 65 | Resp 16 | Ht 64.0 in | Wt 210.0 lb

## 2023-05-14 DIAGNOSIS — M25531 Pain in right wrist: Secondary | ICD-10-CM | POA: Diagnosis not present

## 2023-05-14 DIAGNOSIS — Z1211 Encounter for screening for malignant neoplasm of colon: Secondary | ICD-10-CM

## 2023-05-14 DIAGNOSIS — M5116 Intervertebral disc disorders with radiculopathy, lumbar region: Secondary | ICD-10-CM

## 2023-05-14 DIAGNOSIS — I10 Essential (primary) hypertension: Secondary | ICD-10-CM

## 2023-05-14 DIAGNOSIS — G43001 Migraine without aura, not intractable, with status migrainosus: Secondary | ICD-10-CM | POA: Diagnosis not present

## 2023-05-14 DIAGNOSIS — E785 Hyperlipidemia, unspecified: Secondary | ICD-10-CM

## 2023-05-14 DIAGNOSIS — E559 Vitamin D deficiency, unspecified: Secondary | ICD-10-CM

## 2023-05-14 MED ORDER — AMLODIPINE BESYLATE 10 MG PO TABS: 10.0000 mg | ORAL_TABLET | Freq: Every day | ORAL | 1 refills | Status: DC

## 2023-05-14 NOTE — Assessment & Plan Note (Signed)
 Controlled, no change in medication

## 2023-05-14 NOTE — Assessment & Plan Note (Signed)
 DASH diet and commitment to daily physical activity for a minimum of 30 minutes discussed and encouraged, as a part of hypertension management. The importance of attaining a healthy weight is also discussed.     05/14/2023    8:53 PM 05/14/2023    4:35 PM 01/01/2023    1:53 PM 01/01/2023    1:09 PM 01/01/2023    1:08 PM 06/18/2022    3:42 PM 05/11/2022    3:05 PM  BP/Weight  Systolic BP 160 148 160 165 174 136 160  Diastolic BP 96 88 100 96 102 69 103  Wt. (Lbs)  210.04   212.08 209   BMI  36.05 kg/m2   36.4 kg/m2 35.87 kg/m2      Uncontrolled , nurse BP check in 4 weeks med increase if still uncontrolled

## 2023-05-14 NOTE — Assessment & Plan Note (Signed)
  Patient re-educated about  the importance of commitment to a  minimum of 150 minutes of exercise per week as able.  The importance of healthy food choices with portion control discussed, as well as eating regularly and within a 12 hour window most days. The need to choose "clean , green" food 50 to 75% of the time is discussed, as well as to make water the primary drink and set a goal of 64 ounces water daily.       05/14/2023    4:35 PM 01/01/2023    1:08 PM 06/18/2022    3:42 PM  Weight /BMI  Weight 210 lb 0.6 oz 212 lb 1.3 oz 209 lb  Height 5\' 4"  (1.626 m) 5\' 4"  (1.626 m) 5\' 4"  (1.626 m)  BMI 36.05 kg/m2 36.4 kg/m2 35.87 kg/m2    Unchnaged, too stressed to focus on necessary lifestyle change

## 2023-05-14 NOTE — Assessment & Plan Note (Signed)
 No recent flare.

## 2023-05-14 NOTE — Progress Notes (Signed)
 Bridget Roberts     MRN: [id number]      DOB: 03/19/1965  Chief Complaint  Patient presents with   Hypertension    Follow up on hypertension     HPI Ms. Warning is here for follow up and re-evaluation of chronic medical conditions, medication management and review of any available recent lab and radiology data.  Preventive health is updated, specifically  Cancer screening and Immunization.  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 Denies sinus pressure, nasal congestion, ear pain or sore throat. Denies chest congestion, productive cough or wheezing. Denies chest pains, palpitations and leg swelling Denies abdominal pain, nausea, vomiting,diarrhea or constipation.   Denies dysuria, frequency, hesitancy or incontinence. Denies skin break down or rash.   PE  BP (!) 160/96   Pulse 65   Resp 16   Ht 5\' 4"  (1.626 m)   Wt 210 lb 0.6 oz (95.3 kg)   LMP 01/29/2013   SpO2 98%   BMI 36.05 kg/m     Patient alert and oriented and in no cardiopulmonary distress.  HEENT: No facial asymmetry, EOMI,     Neck supple .  Chest: Clear to auscultation bilaterally.  CVS: S1, S2 no murmurs, no S3.Regular rate.  ABD: Soft non tender.   Ext: No edema  MS: Adequate ROM spine, shoulders, hips and knees.Right wrist swollen and tender  Skin: Intact, no ulcerations or rash noted.  Psych: Good eye contact, normal affect. Memory intact not anxious or depressed appearing.  CNS: CN 2-12 intact, power,  normal throughout.no focal deficits noted.   Assessment & Plan  Wrist pain, right 2 week history no acute injury , lifts weight 8 pounds past shoulder on the job, ortho eval and management  Essential  hypertension DASH diet and commitment to daily physical activity for a minimum of 30 minutes discussed and encouraged, as a part of hypertension management. The importance of attaining a healthy weight is also discussed.     05/14/2023    8:53 PM 05/14/2023    4:35 PM 01/01/2023    1:53 PM 01/01/2023    1:09 PM 01/01/2023    1:08 PM 06/18/2022    3:42 PM 05/11/2022    3:05 PM  BP/Weight  Systolic BP 160 148 160 165 174 136 160  Diastolic BP 96 88 100 96 102 69 103  Wt. (Lbs)  210.04   212.08 209   BMI  36.05 kg/m2   36.4 kg/m2 35.87 kg/m2      Uncontrolled , nurse BP check in 4 weeks med increase if still uncontrolled  Migraine headache Controlled, no change in medication   Lumbar disc disease with radiculopathy No recent flare   Hyperlipidemia LDL goal <100 Hyperlipidemia:Low fat diet discussed and encouraged.   Lipid Panel  Lab Results  Component Value Date   CHOL 191 07/04/2022   HDL 71 07/04/2022   LDLCALC 109 (H) 07/04/2022   TRIG 60 07/04/2022   CHOLHDL 2.7 07/04/2022     Updated lab needed at/ before next visit.   Vitamin D deficiency Updated lab needed at/ before next visit.   Morbid obesity (HCC)  Patient re-educated about  the importance of commitment to a  minimum of 150 minutes of exercise per week as able.  The importance of healthy food choices with portion control discussed, as well as eating regularly and within a 12 hour window most days. The need to choose "clean , green" food 50 to 75% of the time is discussed, as well as to make water the primary drink and set a goal of 64 ounces water daily.       05/14/2023    4:35 PM 01/01/2023    1:08 PM 06/18/2022    3:42 PM  Weight /BMI  Weight 210 lb 0.6 oz 212 lb 1.3 oz 209 lb  Height 5\' 4"  (1.626 m) 5\' 4"  (1.626 m) 5\' 4"  (1.626 m)  BMI 36.05 kg/m2 36.4 kg/m2 35.87 kg/m2    Unchnaged, too stressed to focus on necessary lifestyle change

## 2023-05-14 NOTE — Assessment & Plan Note (Signed)
 Hyperlipidemia:Low fat diet discussed and encouraged.   Lipid Panel  Lab Results  Component Value Date   CHOL 191 07/04/2022   HDL 71 07/04/2022   LDLCALC 109 (H) 07/04/2022   TRIG 60 07/04/2022   CHOLHDL 2.7 07/04/2022     Updated lab needed at/ before next visit.

## 2023-05-14 NOTE — Assessment & Plan Note (Signed)
 Updated lab needed at/ before next visit.

## 2023-05-14 NOTE — Patient Instructions (Addendum)
 F/U in  4 months, call if you need  me sooner   Nurse BP check in 4 weeks , if your BP is still greater than or equal to 140/90, additional medication to be added for blood pressure  [facility] at [facility] latest appt will be scheduled,   You are being referred to Dr Amanda Pea re right wrist pain  Fasting labs already ordered need to be done as soon as possibble a [facility] in [place] is fine  You are referred for gI for screening colonoscopy .  Take time as best able for yourself  Thanks for choosing [facility], we consider it a privelige to serve you.  Expect a message from out office regarding your 4 month f/u appointment, Nurse visit appointment and mammogram appointment   Ortho office will contact you re your appointment with them, you are referre to Dr Amanda Pea

## 2023-05-14 NOTE — Assessment & Plan Note (Addendum)
 2 week history no acute injury , lifts weight 8 pounds past shoulder on the job, ortho eval and management

## 2023-05-15 ENCOUNTER — Encounter (INDEPENDENT_AMBULATORY_CARE_PROVIDER_SITE_OTHER): Payer: Self-pay | Admitting: *Deleted

## 2023-06-09 ENCOUNTER — Encounter (HOSPITAL_COMMUNITY): Payer: Self-pay

## 2023-06-09 ENCOUNTER — Ambulatory Visit (HOSPITAL_COMMUNITY)
Admission: RE | Admit: 2023-06-09 | Discharge: 2023-06-09 | Disposition: A | Discharge status: Home / Self Care | Source: Ambulatory Visit | Attending: Family Medicine | Admitting: Family Medicine

## 2023-06-09 DIAGNOSIS — Z1231 Encounter for screening mammogram for malignant neoplasm of breast: Secondary | ICD-10-CM | POA: Insufficient documentation

## 2023-06-16 ENCOUNTER — Ambulatory Visit

## 2023-06-16 NOTE — Progress Notes (Signed)
 Patient is in office today for a nurse visit for Blood Pressure Check. Patient blood pressure was 135/83, Patient No chest pain, No shortness of breath, No dyspnea on exertion, No orthopnea, No paroxysmal nocturnal dyspnea, No edema, No palpitations, No syncope

## 2023-09-16 ENCOUNTER — Encounter: Payer: Self-pay | Admitting: Family Medicine

## 2023-09-16 ENCOUNTER — Ambulatory Visit (INDEPENDENT_AMBULATORY_CARE_PROVIDER_SITE_OTHER): Payer: Self-pay | Admitting: Family Medicine

## 2023-09-16 VITALS — BP 142/72 | HR 61 | Resp 16 | Ht 64.0 in | Wt 202.0 lb

## 2023-09-16 DIAGNOSIS — E785 Hyperlipidemia, unspecified: Secondary | ICD-10-CM

## 2023-09-16 DIAGNOSIS — E559 Vitamin D deficiency, unspecified: Secondary | ICD-10-CM

## 2023-09-16 DIAGNOSIS — I10 Essential (primary) hypertension: Secondary | ICD-10-CM | POA: Diagnosis not present

## 2023-09-16 DIAGNOSIS — J45991 Cough variant asthma: Secondary | ICD-10-CM

## 2023-09-16 DIAGNOSIS — M5116 Intervertebral disc disorders with radiculopathy, lumbar region: Secondary | ICD-10-CM

## 2023-09-16 MED ORDER — AMLODIPINE BESYLATE 10 MG PO TABS: 10.0000 mg | ORAL_TABLET | Freq: Every day | ORAL | 1 refills | Status: AC

## 2023-09-16 NOTE — Patient Instructions (Addendum)
 F/U first week in  January  Pls add CBC to lab s already ordered, and print  CONGRATS on weight loss  Condolence on recent loss  It is important that you exercise regularly at least 30 minutes 5 times a week. If you develop chest pain, have severe difficulty breathing, or feel very tired, stop exercising immediately and seek medical attention    Thanks for choosing [facility], we consider it a privelige to serve you.

## 2023-09-17 ENCOUNTER — Encounter: Payer: Self-pay | Admitting: Family Medicine

## 2023-09-17 ENCOUNTER — Telehealth: Payer: Self-pay | Admitting: Family Medicine

## 2023-09-17 MED ORDER — SUMATRIPTAN SUCCINATE 100 MG PO TABS: ORAL_TABLET | ORAL | 2 refills | Status: AC

## 2023-09-17 MED ORDER — SPIRONOLACTONE 25 MG PO TABS: 25.0000 mg | ORAL_TABLET | Freq: Every day | ORAL | 5 refills | Status: AC

## 2023-09-17 NOTE — Assessment & Plan Note (Signed)
 Controlled, no change in medication

## 2023-09-17 NOTE — Telephone Encounter (Signed)
In provider box!

## 2023-09-17 NOTE — Assessment & Plan Note (Signed)
 Updated lab needed at/ before next visit.

## 2023-09-17 NOTE — Telephone Encounter (Signed)
 Pls call pt as a f/u on visit of 09/16/2023 Updated / correcteddischarge instruction with new additional bP med started , and also need to schedule nurse visit , both added after she left yesterday Lab oorder sheet fasting s printed and in your box, get asap  Has f/u appt with me scheduled for January, keep that Completed paperwork is in your area, fMLA forms als her updated lab order and discharge instruction Pls call her to collect and get labs and nurse appt, thanks!

## 2023-09-17 NOTE — Assessment & Plan Note (Signed)
 Uncontrolled , add spironolactone  25 mg daily DASH diet and commitment to daily physical activity for a minimum of 30 minutes discussed and encouraged, as a part of hypertension management. The importance of attaining a healthy weight is also discussed.     09/16/2023    4:53 PM 09/16/2023    4:38 PM 06/16/2023    3:36 PM 05/14/2023    8:53 PM 05/14/2023    4:35 PM 01/01/2023    1:53 PM 01/01/2023    1:09 PM  BP/Weight  Systolic BP 142 156 135 160 148 160 165  Diastolic BP 72 89 83 96 88 100 96  Wt. (Lbs)  202   210.04    BMI  34.67 kg/m2   36.05 kg/m2       Nurse bP check in 4 weeks

## 2023-09-17 NOTE — Telephone Encounter (Signed)
 FMLA Noted Copied Sleeved Placed all 4 original in provider box Copy placed front desk folder  Must complete 4 different forms  [employer]  Fax to [phone] and call patient to pick up a copy when ready.

## 2023-09-17 NOTE — Assessment & Plan Note (Signed)
 Intermittent flares which may be disabling and prevent her from working for short periods of time

## 2023-09-17 NOTE — Progress Notes (Signed)
 Bridget Roberts     MRN: [id number]      DOB: 06-May-1965  Chief Complaint  Patient presents with   Hypertension    4 month Follow up     HPI Bridget Roberts is here for follow up and re-evaluation of chronic medical conditions, medication management and review of any available recent lab and radiology data.  Preventive health is updated, specifically  Cancer screening and Immunization.   Questions or concerns regarding consultations or procedures which the PT has had in the interim are  addressed. The PT denies any adverse reactions to current medications since the last visit.  Lost her sister since last visit who she cared for with in home hospiced. Mental and emotional stress as well as long hours onn the job Has worked  at [employer] with successful weight loss nad is encouraged Paperwork for completion for FMLA is being left , needs this every 6 months, no changes in conditions reported ROS Denies recent fever or chills. Denies sinus pressure, nasal congestion, ear pain or sore throat. Denies chest congestion, productive cough or uncontrolled  wheezing. Denies chest pains, palpitations and leg swelling Denies abdominal pain, nausea, vomiting,diarrhea or constipation.   Denies dysuria, frequency, hesitancy or incontinence. Chronic back pain, intermittent flares,  limitation in mobility. Denies uncontrolled  headaches, seizures, numbness, or tingling. Denies depression, anxiety or insomnia. Denies skin break down or rash.   PE  BP (!) 142/72   Pulse 61   Resp 16   Ht 5' 4 (1.626 m)   Wt 202 lb (91.6 kg)   LMP 01/29/2013   SpO2 98%   BMI 34.67 kg/m   Patient alert and oriented and in no cardiopulmonary distress.  HEENT: No facial asymmetry, EOMI,     Neck supple .  Chest: Clear to auscultation bilaterally.  CVS: S1, S2 no murmurs, no S3.Regular rate.  ABD: Soft non tender.   Ext: No edema  MS: Adequate ROM spine, shoulders, hips and knees.  Skin: Intact, no  ulcerations or rash noted.  Psych: Good eye contact, normal affect. Memory intact not anxious or depressed appearing.  CNS: CN 2-12 intact, t.no focal deficits noted.   Assessment & Plan  Essential hypertension Uncontrolled , add spironolactone  25 mg daily DASH diet and commitment to daily physical activity for a minimum of 30 minutes discussed and encouraged, as a part of hypertension management. The importance of attaining a healthy weight is also discussed.     09/16/2023    4:53 PM 09/16/2023    4:38 PM 06/16/2023    3:36 PM 05/14/2023    8:53 PM 05/14/2023    4:35 PM 01/01/2023    1:53 PM 01/01/2023    1:09 PM  BP/Weight  Systolic BP 142 156 135 160 148 160 165  Diastolic BP 72 89 83 96 88 100 96  Wt. (Lbs)  202   210.04    BMI  34.67 kg/m2   36.05 kg/m2       Nurse bP check in 4 weeks  Morbid obesity (HCC)  Patient re-educated about  the importance of commitment to a  minimum of 150 minutes of exercise per week as able.  The importance of healthy food choices with portion control discussed, as well as eating regularly and within a 12 hour window most days. The need to choose clean , green food 50 to 75% of the time is discussed, as well as to make water  the primary drink and set a goal  of 64 ounces water  daily.       09/16/2023    4:38 PM 05/14/2023    4:35 PM 01/01/2023    1:08 PM  Weight /BMI  Weight 202 lb 210 lb 0.6 oz 212 lb 1.3 oz  Height 5' 4 (1.626 m) 5' 4 (1.626 m) 5' 4 (1.626 m)  BMI 34.67 kg/m2 36.05 kg/m2 36.4 kg/m2    Improved which is great  Hyperlipidemia LDL goal <100 Hyperlipidemia:Low fat diet discussed and encouraged.   Lipid Panel  Lab Results  Component Value Date   CHOL 191 07/04/2022   HDL 71 07/04/2022   LDLCALC 109 (H) 07/04/2022   TRIG 60 07/04/2022   CHOLHDL 2.7 07/04/2022     Updated lab needed at/ before next visit.   Vitamin D  deficiency Updated lab needed at/ before next visit.   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

## 2023-09-17 NOTE — Assessment & Plan Note (Signed)
  Patient re-educated about  the importance of commitment to a  minimum of 150 minutes of exercise per week as able.  The importance of healthy food choices with portion control discussed, as well as eating regularly and within a 12 hour window most days. The need to choose clean , green food 50 to 75% of the time is discussed, as well as to make water  the primary drink and set a goal of 64 ounces water  daily.       09/16/2023    4:38 PM 05/14/2023    4:35 PM 01/01/2023    1:08 PM  Weight /BMI  Weight 202 lb 210 lb 0.6 oz 212 lb 1.3 oz  Height 5' 4 (1.626 m) 5' 4 (1.626 m) 5' 4 (1.626 m)  BMI 34.67 kg/m2 36.05 kg/m2 36.4 kg/m2    Improved which is great

## 2023-09-17 NOTE — Assessment & Plan Note (Signed)
 Hyperlipidemia:Low fat diet discussed and encouraged.   Lipid Panel  Lab Results  Component Value Date   CHOL 191 07/04/2022   HDL 71 07/04/2022   LDLCALC 109 (H) 07/04/2022   TRIG 60 07/04/2022   CHOLHDL 2.7 07/04/2022     Updated lab needed at/ before next visit.

## 2023-09-18 NOTE — Telephone Encounter (Signed)
 Faxed. Copy placed up front. Pt informed

## 2023-11-17 ENCOUNTER — Encounter (INDEPENDENT_AMBULATORY_CARE_PROVIDER_SITE_OTHER): Payer: Self-pay | Admitting: *Deleted

## 2023-11-24 ENCOUNTER — Telehealth: Payer: Self-pay

## 2023-11-24 ENCOUNTER — Ambulatory Visit: Admitting: Family Medicine

## 2023-11-24 NOTE — Telephone Encounter (Signed)
 Re-faxed.

## 2023-11-24 NOTE — Telephone Encounter (Signed)
 Copied from CRM [id number]. Topic: General - Other >> Nov 24, 2023 10:20 AM Delon DASEN wrote: Reason for CRM: employer never received FMLA paperwork, it was supposed to have been faxed over- [phone], will need a doctors note if the that is not sent over

## 2023-11-24 NOTE — Telephone Encounter (Signed)
 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 She would like for a copy to be printed off for her as she will be by today to pick them up. Please assist patient further

## 2023-11-24 NOTE — Telephone Encounter (Signed)
 Paperwork printed and upfront. Pt was contacted

## 2024-02-18 ENCOUNTER — Ambulatory Visit: Admitting: Family Medicine
